# Patient Record
Sex: Male | Born: 1950
Health system: Southern US, Community
[De-identification: ages and names within clinical notes are randomized; demographics above are authoritative.]

## PROBLEM LIST (undated history)

## (undated) DIAGNOSIS — T7840XA Allergy, unspecified, initial encounter: Secondary | ICD-10-CM

## (undated) DIAGNOSIS — M1990 Unspecified osteoarthritis, unspecified site: Secondary | ICD-10-CM

## (undated) DIAGNOSIS — I10 Essential (primary) hypertension: Secondary | ICD-10-CM

## (undated) DIAGNOSIS — C719 Malignant neoplasm of brain, unspecified: Secondary | ICD-10-CM

## (undated) DIAGNOSIS — H269 Unspecified cataract: Secondary | ICD-10-CM

## (undated) DIAGNOSIS — K219 Gastro-esophageal reflux disease without esophagitis: Secondary | ICD-10-CM

## (undated) HISTORY — DX: Allergy, unspecified, initial encounter: T78.40XA

## (undated) HISTORY — DX: Unspecified cataract: H26.9

## (undated) HISTORY — DX: Unspecified osteoarthritis, unspecified site: M19.90

---

## 1990-06-11 HISTORY — PX: KNEE SURGERY: SHX244

## 1995-06-12 HISTORY — PX: CARPAL TUNNEL RELEASE: SHX101

## 2015-08-08 DIAGNOSIS — J3089 Other allergic rhinitis: Secondary | ICD-10-CM | POA: Diagnosis not present

## 2015-08-08 DIAGNOSIS — J301 Allergic rhinitis due to pollen: Secondary | ICD-10-CM | POA: Diagnosis not present

## 2015-08-24 DIAGNOSIS — J301 Allergic rhinitis due to pollen: Secondary | ICD-10-CM | POA: Diagnosis not present

## 2015-08-24 DIAGNOSIS — J3089 Other allergic rhinitis: Secondary | ICD-10-CM | POA: Diagnosis not present

## 2015-08-24 DIAGNOSIS — R69 Illness, unspecified: Secondary | ICD-10-CM | POA: Diagnosis not present

## 2015-09-07 DIAGNOSIS — J301 Allergic rhinitis due to pollen: Secondary | ICD-10-CM | POA: Diagnosis not present

## 2015-09-07 DIAGNOSIS — J3089 Other allergic rhinitis: Secondary | ICD-10-CM | POA: Diagnosis not present

## 2015-09-08 DIAGNOSIS — T50905A Adverse effect of unspecified drugs, medicaments and biological substances, initial encounter: Secondary | ICD-10-CM | POA: Diagnosis not present

## 2015-09-08 DIAGNOSIS — Z6829 Body mass index (BMI) 29.0-29.9, adult: Secondary | ICD-10-CM | POA: Diagnosis not present

## 2015-09-08 DIAGNOSIS — J309 Allergic rhinitis, unspecified: Secondary | ICD-10-CM | POA: Diagnosis not present

## 2015-10-05 DIAGNOSIS — J301 Allergic rhinitis due to pollen: Secondary | ICD-10-CM | POA: Diagnosis not present

## 2015-10-05 DIAGNOSIS — J3089 Other allergic rhinitis: Secondary | ICD-10-CM | POA: Diagnosis not present

## 2015-11-02 DIAGNOSIS — J3089 Other allergic rhinitis: Secondary | ICD-10-CM | POA: Diagnosis not present

## 2015-11-02 DIAGNOSIS — J301 Allergic rhinitis due to pollen: Secondary | ICD-10-CM | POA: Diagnosis not present

## 2015-11-30 DIAGNOSIS — J301 Allergic rhinitis due to pollen: Secondary | ICD-10-CM | POA: Diagnosis not present

## 2015-11-30 DIAGNOSIS — J3089 Other allergic rhinitis: Secondary | ICD-10-CM | POA: Diagnosis not present

## 2016-01-04 DIAGNOSIS — J301 Allergic rhinitis due to pollen: Secondary | ICD-10-CM | POA: Diagnosis not present

## 2016-01-04 DIAGNOSIS — J3089 Other allergic rhinitis: Secondary | ICD-10-CM | POA: Diagnosis not present

## 2016-02-15 DIAGNOSIS — J301 Allergic rhinitis due to pollen: Secondary | ICD-10-CM | POA: Diagnosis not present

## 2016-02-15 DIAGNOSIS — J3089 Other allergic rhinitis: Secondary | ICD-10-CM | POA: Diagnosis not present

## 2016-03-14 DIAGNOSIS — Z6829 Body mass index (BMI) 29.0-29.9, adult: Secondary | ICD-10-CM | POA: Diagnosis not present

## 2016-03-14 DIAGNOSIS — Z0000 Encounter for general adult medical examination without abnormal findings: Secondary | ICD-10-CM | POA: Diagnosis not present

## 2016-03-14 DIAGNOSIS — Z23 Encounter for immunization: Secondary | ICD-10-CM | POA: Diagnosis not present

## 2016-03-14 DIAGNOSIS — M542 Cervicalgia: Secondary | ICD-10-CM | POA: Diagnosis not present

## 2016-03-14 DIAGNOSIS — E569 Vitamin deficiency, unspecified: Secondary | ICD-10-CM | POA: Diagnosis not present

## 2016-03-14 DIAGNOSIS — I10 Essential (primary) hypertension: Secondary | ICD-10-CM | POA: Diagnosis not present

## 2016-03-14 DIAGNOSIS — H9202 Otalgia, left ear: Secondary | ICD-10-CM | POA: Diagnosis not present

## 2016-03-14 DIAGNOSIS — K219 Gastro-esophageal reflux disease without esophagitis: Secondary | ICD-10-CM | POA: Diagnosis not present

## 2016-03-14 DIAGNOSIS — E782 Mixed hyperlipidemia: Secondary | ICD-10-CM | POA: Diagnosis not present

## 2016-03-14 DIAGNOSIS — Z125 Encounter for screening for malignant neoplasm of prostate: Secondary | ICD-10-CM | POA: Diagnosis not present

## 2016-03-14 DIAGNOSIS — E559 Vitamin D deficiency, unspecified: Secondary | ICD-10-CM | POA: Diagnosis not present

## 2016-03-15 DIAGNOSIS — J301 Allergic rhinitis due to pollen: Secondary | ICD-10-CM | POA: Diagnosis not present

## 2016-03-15 DIAGNOSIS — J3089 Other allergic rhinitis: Secondary | ICD-10-CM | POA: Diagnosis not present

## 2016-04-25 DIAGNOSIS — R69 Illness, unspecified: Secondary | ICD-10-CM | POA: Diagnosis not present

## 2016-05-09 DIAGNOSIS — J301 Allergic rhinitis due to pollen: Secondary | ICD-10-CM | POA: Diagnosis not present

## 2016-05-09 DIAGNOSIS — J3089 Other allergic rhinitis: Secondary | ICD-10-CM | POA: Diagnosis not present

## 2016-06-06 DIAGNOSIS — J3089 Other allergic rhinitis: Secondary | ICD-10-CM | POA: Diagnosis not present

## 2016-06-06 DIAGNOSIS — H25093 Other age-related incipient cataract, bilateral: Secondary | ICD-10-CM | POA: Diagnosis not present

## 2016-06-06 DIAGNOSIS — J301 Allergic rhinitis due to pollen: Secondary | ICD-10-CM | POA: Diagnosis not present

## 2016-07-04 DIAGNOSIS — J3089 Other allergic rhinitis: Secondary | ICD-10-CM | POA: Diagnosis not present

## 2016-07-04 DIAGNOSIS — J301 Allergic rhinitis due to pollen: Secondary | ICD-10-CM | POA: Diagnosis not present

## 2016-07-31 DIAGNOSIS — R69 Illness, unspecified: Secondary | ICD-10-CM | POA: Diagnosis not present

## 2016-08-15 DIAGNOSIS — J3089 Other allergic rhinitis: Secondary | ICD-10-CM | POA: Diagnosis not present

## 2016-08-15 DIAGNOSIS — J301 Allergic rhinitis due to pollen: Secondary | ICD-10-CM | POA: Diagnosis not present

## 2016-09-03 DIAGNOSIS — J301 Allergic rhinitis due to pollen: Secondary | ICD-10-CM | POA: Diagnosis not present

## 2016-09-03 DIAGNOSIS — J3089 Other allergic rhinitis: Secondary | ICD-10-CM | POA: Diagnosis not present

## 2016-09-26 DIAGNOSIS — J3089 Other allergic rhinitis: Secondary | ICD-10-CM | POA: Diagnosis not present

## 2016-09-26 DIAGNOSIS — J3081 Allergic rhinitis due to animal (cat) (dog) hair and dander: Secondary | ICD-10-CM | POA: Diagnosis not present

## 2016-09-26 DIAGNOSIS — J301 Allergic rhinitis due to pollen: Secondary | ICD-10-CM | POA: Diagnosis not present

## 2016-10-12 DIAGNOSIS — J3081 Allergic rhinitis due to animal (cat) (dog) hair and dander: Secondary | ICD-10-CM | POA: Diagnosis not present

## 2016-10-12 DIAGNOSIS — J3089 Other allergic rhinitis: Secondary | ICD-10-CM | POA: Diagnosis not present

## 2016-10-12 DIAGNOSIS — J301 Allergic rhinitis due to pollen: Secondary | ICD-10-CM | POA: Diagnosis not present

## 2016-10-23 DIAGNOSIS — R69 Illness, unspecified: Secondary | ICD-10-CM | POA: Diagnosis not present

## 2016-10-24 DIAGNOSIS — J301 Allergic rhinitis due to pollen: Secondary | ICD-10-CM | POA: Diagnosis not present

## 2016-10-24 DIAGNOSIS — J3081 Allergic rhinitis due to animal (cat) (dog) hair and dander: Secondary | ICD-10-CM | POA: Diagnosis not present

## 2016-10-24 DIAGNOSIS — J3089 Other allergic rhinitis: Secondary | ICD-10-CM | POA: Diagnosis not present

## 2016-10-31 DIAGNOSIS — J301 Allergic rhinitis due to pollen: Secondary | ICD-10-CM | POA: Diagnosis not present

## 2016-10-31 DIAGNOSIS — J3089 Other allergic rhinitis: Secondary | ICD-10-CM | POA: Diagnosis not present

## 2016-10-31 DIAGNOSIS — J3081 Allergic rhinitis due to animal (cat) (dog) hair and dander: Secondary | ICD-10-CM | POA: Diagnosis not present

## 2016-11-07 DIAGNOSIS — J3089 Other allergic rhinitis: Secondary | ICD-10-CM | POA: Diagnosis not present

## 2016-11-07 DIAGNOSIS — J3081 Allergic rhinitis due to animal (cat) (dog) hair and dander: Secondary | ICD-10-CM | POA: Diagnosis not present

## 2016-11-07 DIAGNOSIS — J301 Allergic rhinitis due to pollen: Secondary | ICD-10-CM | POA: Diagnosis not present

## 2016-11-14 DIAGNOSIS — J301 Allergic rhinitis due to pollen: Secondary | ICD-10-CM | POA: Diagnosis not present

## 2016-11-14 DIAGNOSIS — J3081 Allergic rhinitis due to animal (cat) (dog) hair and dander: Secondary | ICD-10-CM | POA: Diagnosis not present

## 2016-11-14 DIAGNOSIS — J3089 Other allergic rhinitis: Secondary | ICD-10-CM | POA: Diagnosis not present

## 2016-11-21 DIAGNOSIS — J3081 Allergic rhinitis due to animal (cat) (dog) hair and dander: Secondary | ICD-10-CM | POA: Diagnosis not present

## 2016-11-21 DIAGNOSIS — J301 Allergic rhinitis due to pollen: Secondary | ICD-10-CM | POA: Diagnosis not present

## 2016-11-21 DIAGNOSIS — J3089 Other allergic rhinitis: Secondary | ICD-10-CM | POA: Diagnosis not present

## 2016-11-27 DIAGNOSIS — J3081 Allergic rhinitis due to animal (cat) (dog) hair and dander: Secondary | ICD-10-CM | POA: Diagnosis not present

## 2016-11-27 DIAGNOSIS — J3089 Other allergic rhinitis: Secondary | ICD-10-CM | POA: Diagnosis not present

## 2016-11-27 DIAGNOSIS — J301 Allergic rhinitis due to pollen: Secondary | ICD-10-CM | POA: Diagnosis not present

## 2016-11-28 DIAGNOSIS — R69 Illness, unspecified: Secondary | ICD-10-CM | POA: Diagnosis not present

## 2016-12-04 DIAGNOSIS — J3089 Other allergic rhinitis: Secondary | ICD-10-CM | POA: Diagnosis not present

## 2016-12-04 DIAGNOSIS — J301 Allergic rhinitis due to pollen: Secondary | ICD-10-CM | POA: Diagnosis not present

## 2016-12-04 DIAGNOSIS — J3081 Allergic rhinitis due to animal (cat) (dog) hair and dander: Secondary | ICD-10-CM | POA: Diagnosis not present

## 2016-12-11 DIAGNOSIS — J301 Allergic rhinitis due to pollen: Secondary | ICD-10-CM | POA: Diagnosis not present

## 2016-12-11 DIAGNOSIS — J3089 Other allergic rhinitis: Secondary | ICD-10-CM | POA: Diagnosis not present

## 2016-12-11 DIAGNOSIS — J3081 Allergic rhinitis due to animal (cat) (dog) hair and dander: Secondary | ICD-10-CM | POA: Diagnosis not present

## 2016-12-19 DIAGNOSIS — J301 Allergic rhinitis due to pollen: Secondary | ICD-10-CM | POA: Diagnosis not present

## 2016-12-19 DIAGNOSIS — J3089 Other allergic rhinitis: Secondary | ICD-10-CM | POA: Diagnosis not present

## 2016-12-19 DIAGNOSIS — J3081 Allergic rhinitis due to animal (cat) (dog) hair and dander: Secondary | ICD-10-CM | POA: Diagnosis not present

## 2016-12-26 DIAGNOSIS — J3089 Other allergic rhinitis: Secondary | ICD-10-CM | POA: Diagnosis not present

## 2016-12-26 DIAGNOSIS — J301 Allergic rhinitis due to pollen: Secondary | ICD-10-CM | POA: Diagnosis not present

## 2016-12-26 DIAGNOSIS — J3081 Allergic rhinitis due to animal (cat) (dog) hair and dander: Secondary | ICD-10-CM | POA: Diagnosis not present

## 2017-01-09 DIAGNOSIS — J3089 Other allergic rhinitis: Secondary | ICD-10-CM | POA: Diagnosis not present

## 2017-01-09 DIAGNOSIS — J301 Allergic rhinitis due to pollen: Secondary | ICD-10-CM | POA: Diagnosis not present

## 2017-01-09 DIAGNOSIS — J3081 Allergic rhinitis due to animal (cat) (dog) hair and dander: Secondary | ICD-10-CM | POA: Diagnosis not present

## 2017-01-16 DIAGNOSIS — J3081 Allergic rhinitis due to animal (cat) (dog) hair and dander: Secondary | ICD-10-CM | POA: Diagnosis not present

## 2017-01-16 DIAGNOSIS — J3089 Other allergic rhinitis: Secondary | ICD-10-CM | POA: Diagnosis not present

## 2017-01-16 DIAGNOSIS — J301 Allergic rhinitis due to pollen: Secondary | ICD-10-CM | POA: Diagnosis not present

## 2017-01-23 DIAGNOSIS — J301 Allergic rhinitis due to pollen: Secondary | ICD-10-CM | POA: Diagnosis not present

## 2017-01-23 DIAGNOSIS — J3081 Allergic rhinitis due to animal (cat) (dog) hair and dander: Secondary | ICD-10-CM | POA: Diagnosis not present

## 2017-01-23 DIAGNOSIS — J3089 Other allergic rhinitis: Secondary | ICD-10-CM | POA: Diagnosis not present

## 2017-01-30 DIAGNOSIS — J3081 Allergic rhinitis due to animal (cat) (dog) hair and dander: Secondary | ICD-10-CM | POA: Diagnosis not present

## 2017-01-30 DIAGNOSIS — J301 Allergic rhinitis due to pollen: Secondary | ICD-10-CM | POA: Diagnosis not present

## 2017-01-30 DIAGNOSIS — J3089 Other allergic rhinitis: Secondary | ICD-10-CM | POA: Diagnosis not present

## 2017-02-04 DIAGNOSIS — J3081 Allergic rhinitis due to animal (cat) (dog) hair and dander: Secondary | ICD-10-CM | POA: Diagnosis not present

## 2017-02-04 DIAGNOSIS — J3089 Other allergic rhinitis: Secondary | ICD-10-CM | POA: Diagnosis not present

## 2017-02-04 DIAGNOSIS — J301 Allergic rhinitis due to pollen: Secondary | ICD-10-CM | POA: Diagnosis not present

## 2017-02-12 DIAGNOSIS — Z1211 Encounter for screening for malignant neoplasm of colon: Secondary | ICD-10-CM | POA: Diagnosis not present

## 2017-02-13 DIAGNOSIS — J3089 Other allergic rhinitis: Secondary | ICD-10-CM | POA: Diagnosis not present

## 2017-02-13 DIAGNOSIS — J301 Allergic rhinitis due to pollen: Secondary | ICD-10-CM | POA: Diagnosis not present

## 2017-02-13 DIAGNOSIS — J3081 Allergic rhinitis due to animal (cat) (dog) hair and dander: Secondary | ICD-10-CM | POA: Diagnosis not present

## 2017-02-20 DIAGNOSIS — J3089 Other allergic rhinitis: Secondary | ICD-10-CM | POA: Diagnosis not present

## 2017-02-20 DIAGNOSIS — J3081 Allergic rhinitis due to animal (cat) (dog) hair and dander: Secondary | ICD-10-CM | POA: Diagnosis not present

## 2017-02-20 DIAGNOSIS — J301 Allergic rhinitis due to pollen: Secondary | ICD-10-CM | POA: Diagnosis not present

## 2017-02-26 DIAGNOSIS — J301 Allergic rhinitis due to pollen: Secondary | ICD-10-CM | POA: Diagnosis not present

## 2017-02-26 DIAGNOSIS — J3081 Allergic rhinitis due to animal (cat) (dog) hair and dander: Secondary | ICD-10-CM | POA: Diagnosis not present

## 2017-02-26 DIAGNOSIS — J3089 Other allergic rhinitis: Secondary | ICD-10-CM | POA: Diagnosis not present

## 2017-02-26 DIAGNOSIS — R69 Illness, unspecified: Secondary | ICD-10-CM | POA: Diagnosis not present

## 2017-03-02 DIAGNOSIS — R69 Illness, unspecified: Secondary | ICD-10-CM | POA: Diagnosis not present

## 2017-03-06 DIAGNOSIS — J301 Allergic rhinitis due to pollen: Secondary | ICD-10-CM | POA: Diagnosis not present

## 2017-03-06 DIAGNOSIS — J3089 Other allergic rhinitis: Secondary | ICD-10-CM | POA: Diagnosis not present

## 2017-03-06 DIAGNOSIS — J3081 Allergic rhinitis due to animal (cat) (dog) hair and dander: Secondary | ICD-10-CM | POA: Diagnosis not present

## 2017-03-12 DIAGNOSIS — M79672 Pain in left foot: Secondary | ICD-10-CM | POA: Diagnosis not present

## 2017-03-12 DIAGNOSIS — Z6825 Body mass index (BMI) 25.0-25.9, adult: Secondary | ICD-10-CM | POA: Diagnosis not present

## 2017-03-12 DIAGNOSIS — R52 Pain, unspecified: Secondary | ICD-10-CM | POA: Diagnosis not present

## 2017-03-12 DIAGNOSIS — M9971 Connective tissue and disc stenosis of intervertebral foramina of cervical region: Secondary | ICD-10-CM | POA: Diagnosis not present

## 2017-03-12 DIAGNOSIS — M5030 Other cervical disc degeneration, unspecified cervical region: Secondary | ICD-10-CM | POA: Diagnosis not present

## 2017-03-12 DIAGNOSIS — J301 Allergic rhinitis due to pollen: Secondary | ICD-10-CM | POA: Diagnosis not present

## 2017-03-12 DIAGNOSIS — I10 Essential (primary) hypertension: Secondary | ICD-10-CM | POA: Diagnosis not present

## 2017-03-12 DIAGNOSIS — J3089 Other allergic rhinitis: Secondary | ICD-10-CM | POA: Diagnosis not present

## 2017-03-12 DIAGNOSIS — J3081 Allergic rhinitis due to animal (cat) (dog) hair and dander: Secondary | ICD-10-CM | POA: Diagnosis not present

## 2017-03-12 DIAGNOSIS — M542 Cervicalgia: Secondary | ICD-10-CM | POA: Diagnosis not present

## 2017-03-12 DIAGNOSIS — Z1389 Encounter for screening for other disorder: Secondary | ICD-10-CM | POA: Diagnosis not present

## 2017-03-12 DIAGNOSIS — Z720 Tobacco use: Secondary | ICD-10-CM | POA: Diagnosis not present

## 2017-03-12 DIAGNOSIS — M4312 Spondylolisthesis, cervical region: Secondary | ICD-10-CM | POA: Diagnosis not present

## 2017-03-12 DIAGNOSIS — M19072 Primary osteoarthritis, left ankle and foot: Secondary | ICD-10-CM | POA: Diagnosis not present

## 2017-03-12 DIAGNOSIS — M47892 Other spondylosis, cervical region: Secondary | ICD-10-CM | POA: Diagnosis not present

## 2017-03-19 DIAGNOSIS — J301 Allergic rhinitis due to pollen: Secondary | ICD-10-CM | POA: Diagnosis not present

## 2017-03-19 DIAGNOSIS — J3081 Allergic rhinitis due to animal (cat) (dog) hair and dander: Secondary | ICD-10-CM | POA: Diagnosis not present

## 2017-03-19 DIAGNOSIS — J3089 Other allergic rhinitis: Secondary | ICD-10-CM | POA: Diagnosis not present

## 2017-03-26 DIAGNOSIS — J301 Allergic rhinitis due to pollen: Secondary | ICD-10-CM | POA: Diagnosis not present

## 2017-03-26 DIAGNOSIS — J3081 Allergic rhinitis due to animal (cat) (dog) hair and dander: Secondary | ICD-10-CM | POA: Diagnosis not present

## 2017-03-26 DIAGNOSIS — J3089 Other allergic rhinitis: Secondary | ICD-10-CM | POA: Diagnosis not present

## 2017-04-02 DIAGNOSIS — J301 Allergic rhinitis due to pollen: Secondary | ICD-10-CM | POA: Diagnosis not present

## 2017-04-02 DIAGNOSIS — J3081 Allergic rhinitis due to animal (cat) (dog) hair and dander: Secondary | ICD-10-CM | POA: Diagnosis not present

## 2017-04-02 DIAGNOSIS — J3089 Other allergic rhinitis: Secondary | ICD-10-CM | POA: Diagnosis not present

## 2017-04-10 DIAGNOSIS — J3081 Allergic rhinitis due to animal (cat) (dog) hair and dander: Secondary | ICD-10-CM | POA: Diagnosis not present

## 2017-04-10 DIAGNOSIS — J301 Allergic rhinitis due to pollen: Secondary | ICD-10-CM | POA: Diagnosis not present

## 2017-04-10 DIAGNOSIS — J3089 Other allergic rhinitis: Secondary | ICD-10-CM | POA: Diagnosis not present

## 2017-04-16 DIAGNOSIS — J3089 Other allergic rhinitis: Secondary | ICD-10-CM | POA: Diagnosis not present

## 2017-04-16 DIAGNOSIS — J3081 Allergic rhinitis due to animal (cat) (dog) hair and dander: Secondary | ICD-10-CM | POA: Diagnosis not present

## 2017-04-16 DIAGNOSIS — J301 Allergic rhinitis due to pollen: Secondary | ICD-10-CM | POA: Diagnosis not present

## 2017-04-23 DIAGNOSIS — J3081 Allergic rhinitis due to animal (cat) (dog) hair and dander: Secondary | ICD-10-CM | POA: Diagnosis not present

## 2017-04-23 DIAGNOSIS — J3089 Other allergic rhinitis: Secondary | ICD-10-CM | POA: Diagnosis not present

## 2017-04-23 DIAGNOSIS — J301 Allergic rhinitis due to pollen: Secondary | ICD-10-CM | POA: Diagnosis not present

## 2017-04-29 DIAGNOSIS — J301 Allergic rhinitis due to pollen: Secondary | ICD-10-CM | POA: Diagnosis not present

## 2017-04-29 DIAGNOSIS — J3081 Allergic rhinitis due to animal (cat) (dog) hair and dander: Secondary | ICD-10-CM | POA: Diagnosis not present

## 2017-04-29 DIAGNOSIS — J3089 Other allergic rhinitis: Secondary | ICD-10-CM | POA: Diagnosis not present

## 2017-05-08 DIAGNOSIS — J3089 Other allergic rhinitis: Secondary | ICD-10-CM | POA: Diagnosis not present

## 2017-05-08 DIAGNOSIS — J3081 Allergic rhinitis due to animal (cat) (dog) hair and dander: Secondary | ICD-10-CM | POA: Diagnosis not present

## 2017-05-08 DIAGNOSIS — J301 Allergic rhinitis due to pollen: Secondary | ICD-10-CM | POA: Diagnosis not present

## 2017-05-17 DIAGNOSIS — J3089 Other allergic rhinitis: Secondary | ICD-10-CM | POA: Diagnosis not present

## 2017-05-17 DIAGNOSIS — J301 Allergic rhinitis due to pollen: Secondary | ICD-10-CM | POA: Diagnosis not present

## 2017-05-17 DIAGNOSIS — J3081 Allergic rhinitis due to animal (cat) (dog) hair and dander: Secondary | ICD-10-CM | POA: Diagnosis not present

## 2017-05-27 DIAGNOSIS — J3081 Allergic rhinitis due to animal (cat) (dog) hair and dander: Secondary | ICD-10-CM | POA: Diagnosis not present

## 2017-05-27 DIAGNOSIS — J3089 Other allergic rhinitis: Secondary | ICD-10-CM | POA: Diagnosis not present

## 2017-05-27 DIAGNOSIS — J301 Allergic rhinitis due to pollen: Secondary | ICD-10-CM | POA: Diagnosis not present

## 2017-06-17 DIAGNOSIS — H5203 Hypermetropia, bilateral: Secondary | ICD-10-CM | POA: Diagnosis not present

## 2017-06-24 DIAGNOSIS — J3081 Allergic rhinitis due to animal (cat) (dog) hair and dander: Secondary | ICD-10-CM | POA: Diagnosis not present

## 2017-06-24 DIAGNOSIS — J3089 Other allergic rhinitis: Secondary | ICD-10-CM | POA: Diagnosis not present

## 2017-06-24 DIAGNOSIS — J301 Allergic rhinitis due to pollen: Secondary | ICD-10-CM | POA: Diagnosis not present

## 2017-07-23 DIAGNOSIS — J3081 Allergic rhinitis due to animal (cat) (dog) hair and dander: Secondary | ICD-10-CM | POA: Diagnosis not present

## 2017-07-23 DIAGNOSIS — J301 Allergic rhinitis due to pollen: Secondary | ICD-10-CM | POA: Diagnosis not present

## 2017-07-23 DIAGNOSIS — J3089 Other allergic rhinitis: Secondary | ICD-10-CM | POA: Diagnosis not present

## 2017-08-21 DIAGNOSIS — J3081 Allergic rhinitis due to animal (cat) (dog) hair and dander: Secondary | ICD-10-CM | POA: Diagnosis not present

## 2017-08-21 DIAGNOSIS — J3089 Other allergic rhinitis: Secondary | ICD-10-CM | POA: Diagnosis not present

## 2017-08-21 DIAGNOSIS — J301 Allergic rhinitis due to pollen: Secondary | ICD-10-CM | POA: Diagnosis not present

## 2017-09-18 DIAGNOSIS — J3081 Allergic rhinitis due to animal (cat) (dog) hair and dander: Secondary | ICD-10-CM | POA: Diagnosis not present

## 2017-09-18 DIAGNOSIS — J301 Allergic rhinitis due to pollen: Secondary | ICD-10-CM | POA: Diagnosis not present

## 2017-09-18 DIAGNOSIS — J3089 Other allergic rhinitis: Secondary | ICD-10-CM | POA: Diagnosis not present

## 2017-10-16 DIAGNOSIS — J3089 Other allergic rhinitis: Secondary | ICD-10-CM | POA: Diagnosis not present

## 2017-10-16 DIAGNOSIS — J3081 Allergic rhinitis due to animal (cat) (dog) hair and dander: Secondary | ICD-10-CM | POA: Diagnosis not present

## 2017-10-16 DIAGNOSIS — J301 Allergic rhinitis due to pollen: Secondary | ICD-10-CM | POA: Diagnosis not present

## 2017-11-12 DIAGNOSIS — J301 Allergic rhinitis due to pollen: Secondary | ICD-10-CM | POA: Diagnosis not present

## 2017-11-12 DIAGNOSIS — J3089 Other allergic rhinitis: Secondary | ICD-10-CM | POA: Diagnosis not present

## 2017-11-12 DIAGNOSIS — J3081 Allergic rhinitis due to animal (cat) (dog) hair and dander: Secondary | ICD-10-CM | POA: Diagnosis not present

## 2017-12-10 DIAGNOSIS — J3081 Allergic rhinitis due to animal (cat) (dog) hair and dander: Secondary | ICD-10-CM | POA: Diagnosis not present

## 2017-12-10 DIAGNOSIS — J3089 Other allergic rhinitis: Secondary | ICD-10-CM | POA: Diagnosis not present

## 2017-12-10 DIAGNOSIS — J301 Allergic rhinitis due to pollen: Secondary | ICD-10-CM | POA: Diagnosis not present

## 2017-12-21 DIAGNOSIS — R030 Elevated blood-pressure reading, without diagnosis of hypertension: Secondary | ICD-10-CM | POA: Diagnosis not present

## 2017-12-21 DIAGNOSIS — E559 Vitamin D deficiency, unspecified: Secondary | ICD-10-CM | POA: Diagnosis not present

## 2017-12-21 DIAGNOSIS — Z825 Family history of asthma and other chronic lower respiratory diseases: Secondary | ICD-10-CM | POA: Diagnosis not present

## 2017-12-21 DIAGNOSIS — M1990 Unspecified osteoarthritis, unspecified site: Secondary | ICD-10-CM | POA: Diagnosis not present

## 2017-12-21 DIAGNOSIS — Z791 Long term (current) use of non-steroidal anti-inflammatories (NSAID): Secondary | ICD-10-CM | POA: Diagnosis not present

## 2017-12-21 DIAGNOSIS — Z8249 Family history of ischemic heart disease and other diseases of the circulatory system: Secondary | ICD-10-CM | POA: Diagnosis not present

## 2017-12-21 DIAGNOSIS — G8929 Other chronic pain: Secondary | ICD-10-CM | POA: Diagnosis not present

## 2017-12-21 DIAGNOSIS — K219 Gastro-esophageal reflux disease without esophagitis: Secondary | ICD-10-CM | POA: Diagnosis not present

## 2017-12-21 DIAGNOSIS — J309 Allergic rhinitis, unspecified: Secondary | ICD-10-CM | POA: Diagnosis not present

## 2017-12-21 DIAGNOSIS — Z833 Family history of diabetes mellitus: Secondary | ICD-10-CM | POA: Diagnosis not present

## 2018-01-08 DIAGNOSIS — J3081 Allergic rhinitis due to animal (cat) (dog) hair and dander: Secondary | ICD-10-CM | POA: Diagnosis not present

## 2018-01-08 DIAGNOSIS — J3089 Other allergic rhinitis: Secondary | ICD-10-CM | POA: Diagnosis not present

## 2018-01-08 DIAGNOSIS — J301 Allergic rhinitis due to pollen: Secondary | ICD-10-CM | POA: Diagnosis not present

## 2018-02-05 DIAGNOSIS — J3081 Allergic rhinitis due to animal (cat) (dog) hair and dander: Secondary | ICD-10-CM | POA: Diagnosis not present

## 2018-02-05 DIAGNOSIS — J3089 Other allergic rhinitis: Secondary | ICD-10-CM | POA: Diagnosis not present

## 2018-02-05 DIAGNOSIS — J301 Allergic rhinitis due to pollen: Secondary | ICD-10-CM | POA: Diagnosis not present

## 2018-02-06 DIAGNOSIS — E785 Hyperlipidemia, unspecified: Secondary | ICD-10-CM | POA: Diagnosis not present

## 2018-02-06 DIAGNOSIS — E559 Vitamin D deficiency, unspecified: Secondary | ICD-10-CM | POA: Diagnosis not present

## 2018-02-06 DIAGNOSIS — Z1389 Encounter for screening for other disorder: Secondary | ICD-10-CM | POA: Diagnosis not present

## 2018-02-06 DIAGNOSIS — I10 Essential (primary) hypertension: Secondary | ICD-10-CM | POA: Diagnosis not present

## 2018-02-06 DIAGNOSIS — K219 Gastro-esophageal reflux disease without esophagitis: Secondary | ICD-10-CM | POA: Diagnosis not present

## 2018-02-28 DIAGNOSIS — Z23 Encounter for immunization: Secondary | ICD-10-CM | POA: Diagnosis not present

## 2018-03-05 DIAGNOSIS — J3081 Allergic rhinitis due to animal (cat) (dog) hair and dander: Secondary | ICD-10-CM | POA: Diagnosis not present

## 2018-03-05 DIAGNOSIS — J301 Allergic rhinitis due to pollen: Secondary | ICD-10-CM | POA: Diagnosis not present

## 2018-03-05 DIAGNOSIS — J3089 Other allergic rhinitis: Secondary | ICD-10-CM | POA: Diagnosis not present

## 2018-03-11 DIAGNOSIS — L570 Actinic keratosis: Secondary | ICD-10-CM | POA: Diagnosis not present

## 2018-03-11 DIAGNOSIS — D225 Melanocytic nevi of trunk: Secondary | ICD-10-CM | POA: Diagnosis not present

## 2018-03-11 DIAGNOSIS — L821 Other seborrheic keratosis: Secondary | ICD-10-CM | POA: Diagnosis not present

## 2018-03-11 DIAGNOSIS — D1801 Hemangioma of skin and subcutaneous tissue: Secondary | ICD-10-CM | POA: Diagnosis not present

## 2018-03-11 DIAGNOSIS — L814 Other melanin hyperpigmentation: Secondary | ICD-10-CM | POA: Diagnosis not present

## 2018-03-18 DIAGNOSIS — E785 Hyperlipidemia, unspecified: Secondary | ICD-10-CM | POA: Diagnosis not present

## 2018-03-18 DIAGNOSIS — Z0000 Encounter for general adult medical examination without abnormal findings: Secondary | ICD-10-CM | POA: Diagnosis not present

## 2018-03-18 DIAGNOSIS — K219 Gastro-esophageal reflux disease without esophagitis: Secondary | ICD-10-CM | POA: Diagnosis not present

## 2018-03-18 DIAGNOSIS — Z125 Encounter for screening for malignant neoplasm of prostate: Secondary | ICD-10-CM | POA: Diagnosis not present

## 2018-03-18 DIAGNOSIS — Z23 Encounter for immunization: Secondary | ICD-10-CM | POA: Diagnosis not present

## 2018-03-18 DIAGNOSIS — E559 Vitamin D deficiency, unspecified: Secondary | ICD-10-CM | POA: Diagnosis not present

## 2018-03-18 DIAGNOSIS — Z1389 Encounter for screening for other disorder: Secondary | ICD-10-CM | POA: Diagnosis not present

## 2018-03-18 DIAGNOSIS — J309 Allergic rhinitis, unspecified: Secondary | ICD-10-CM | POA: Diagnosis not present

## 2018-03-18 DIAGNOSIS — I10 Essential (primary) hypertension: Secondary | ICD-10-CM | POA: Diagnosis not present

## 2018-03-18 DIAGNOSIS — Z1159 Encounter for screening for other viral diseases: Secondary | ICD-10-CM | POA: Diagnosis not present

## 2018-04-09 DIAGNOSIS — J3089 Other allergic rhinitis: Secondary | ICD-10-CM | POA: Diagnosis not present

## 2018-04-09 DIAGNOSIS — J301 Allergic rhinitis due to pollen: Secondary | ICD-10-CM | POA: Diagnosis not present

## 2018-04-09 DIAGNOSIS — J3081 Allergic rhinitis due to animal (cat) (dog) hair and dander: Secondary | ICD-10-CM | POA: Diagnosis not present

## 2018-04-23 DIAGNOSIS — R69 Illness, unspecified: Secondary | ICD-10-CM | POA: Diagnosis not present

## 2018-05-14 DIAGNOSIS — J3089 Other allergic rhinitis: Secondary | ICD-10-CM | POA: Diagnosis not present

## 2018-05-14 DIAGNOSIS — J301 Allergic rhinitis due to pollen: Secondary | ICD-10-CM | POA: Diagnosis not present

## 2018-05-14 DIAGNOSIS — J3081 Allergic rhinitis due to animal (cat) (dog) hair and dander: Secondary | ICD-10-CM | POA: Diagnosis not present

## 2018-05-23 DIAGNOSIS — J301 Allergic rhinitis due to pollen: Secondary | ICD-10-CM | POA: Diagnosis not present

## 2018-05-23 DIAGNOSIS — J3089 Other allergic rhinitis: Secondary | ICD-10-CM | POA: Diagnosis not present

## 2018-05-23 DIAGNOSIS — J3081 Allergic rhinitis due to animal (cat) (dog) hair and dander: Secondary | ICD-10-CM | POA: Diagnosis not present

## 2018-06-10 DIAGNOSIS — J301 Allergic rhinitis due to pollen: Secondary | ICD-10-CM | POA: Diagnosis not present

## 2018-06-10 DIAGNOSIS — J3081 Allergic rhinitis due to animal (cat) (dog) hair and dander: Secondary | ICD-10-CM | POA: Diagnosis not present

## 2018-06-10 DIAGNOSIS — J3089 Other allergic rhinitis: Secondary | ICD-10-CM | POA: Diagnosis not present

## 2018-06-25 DIAGNOSIS — H5203 Hypermetropia, bilateral: Secondary | ICD-10-CM | POA: Diagnosis not present

## 2018-07-02 DIAGNOSIS — J3089 Other allergic rhinitis: Secondary | ICD-10-CM | POA: Diagnosis not present

## 2018-07-02 DIAGNOSIS — J301 Allergic rhinitis due to pollen: Secondary | ICD-10-CM | POA: Diagnosis not present

## 2018-07-02 DIAGNOSIS — J3081 Allergic rhinitis due to animal (cat) (dog) hair and dander: Secondary | ICD-10-CM | POA: Diagnosis not present

## 2018-07-21 DIAGNOSIS — Z0100 Encounter for examination of eyes and vision without abnormal findings: Secondary | ICD-10-CM | POA: Diagnosis not present

## 2018-07-21 DIAGNOSIS — M6281 Muscle weakness (generalized): Secondary | ICD-10-CM | POA: Diagnosis not present

## 2018-07-21 DIAGNOSIS — R269 Unspecified abnormalities of gait and mobility: Secondary | ICD-10-CM | POA: Diagnosis not present

## 2018-07-21 DIAGNOSIS — R69 Illness, unspecified: Secondary | ICD-10-CM | POA: Diagnosis not present

## 2018-08-06 DIAGNOSIS — R69 Illness, unspecified: Secondary | ICD-10-CM | POA: Diagnosis not present

## 2018-08-11 DIAGNOSIS — J3081 Allergic rhinitis due to animal (cat) (dog) hair and dander: Secondary | ICD-10-CM | POA: Diagnosis not present

## 2018-08-11 DIAGNOSIS — J3089 Other allergic rhinitis: Secondary | ICD-10-CM | POA: Diagnosis not present

## 2018-08-11 DIAGNOSIS — J301 Allergic rhinitis due to pollen: Secondary | ICD-10-CM | POA: Diagnosis not present

## 2018-09-10 DIAGNOSIS — J3081 Allergic rhinitis due to animal (cat) (dog) hair and dander: Secondary | ICD-10-CM | POA: Diagnosis not present

## 2018-09-10 DIAGNOSIS — J3089 Other allergic rhinitis: Secondary | ICD-10-CM | POA: Diagnosis not present

## 2018-09-10 DIAGNOSIS — J301 Allergic rhinitis due to pollen: Secondary | ICD-10-CM | POA: Diagnosis not present

## 2018-09-25 DIAGNOSIS — E785 Hyperlipidemia, unspecified: Secondary | ICD-10-CM | POA: Diagnosis not present

## 2018-09-25 DIAGNOSIS — K219 Gastro-esophageal reflux disease without esophagitis: Secondary | ICD-10-CM | POA: Diagnosis not present

## 2018-09-25 DIAGNOSIS — I10 Essential (primary) hypertension: Secondary | ICD-10-CM | POA: Diagnosis not present

## 2018-09-25 DIAGNOSIS — J309 Allergic rhinitis, unspecified: Secondary | ICD-10-CM | POA: Diagnosis not present

## 2018-10-15 DIAGNOSIS — J3081 Allergic rhinitis due to animal (cat) (dog) hair and dander: Secondary | ICD-10-CM | POA: Diagnosis not present

## 2018-10-15 DIAGNOSIS — J3089 Other allergic rhinitis: Secondary | ICD-10-CM | POA: Diagnosis not present

## 2018-10-15 DIAGNOSIS — J301 Allergic rhinitis due to pollen: Secondary | ICD-10-CM | POA: Diagnosis not present

## 2018-10-16 DIAGNOSIS — J3081 Allergic rhinitis due to animal (cat) (dog) hair and dander: Secondary | ICD-10-CM | POA: Diagnosis not present

## 2018-10-16 DIAGNOSIS — J301 Allergic rhinitis due to pollen: Secondary | ICD-10-CM | POA: Diagnosis not present

## 2018-10-16 DIAGNOSIS — J3089 Other allergic rhinitis: Secondary | ICD-10-CM | POA: Diagnosis not present

## 2018-11-25 DIAGNOSIS — J301 Allergic rhinitis due to pollen: Secondary | ICD-10-CM | POA: Diagnosis not present

## 2018-11-25 DIAGNOSIS — J3081 Allergic rhinitis due to animal (cat) (dog) hair and dander: Secondary | ICD-10-CM | POA: Diagnosis not present

## 2018-11-25 DIAGNOSIS — J3089 Other allergic rhinitis: Secondary | ICD-10-CM | POA: Diagnosis not present

## 2018-12-03 DIAGNOSIS — R69 Illness, unspecified: Secondary | ICD-10-CM | POA: Diagnosis not present

## 2018-12-10 DIAGNOSIS — J3089 Other allergic rhinitis: Secondary | ICD-10-CM | POA: Diagnosis not present

## 2018-12-10 DIAGNOSIS — J301 Allergic rhinitis due to pollen: Secondary | ICD-10-CM | POA: Diagnosis not present

## 2018-12-10 DIAGNOSIS — J3081 Allergic rhinitis due to animal (cat) (dog) hair and dander: Secondary | ICD-10-CM | POA: Diagnosis not present

## 2019-01-07 DIAGNOSIS — J3089 Other allergic rhinitis: Secondary | ICD-10-CM | POA: Diagnosis not present

## 2019-01-07 DIAGNOSIS — J301 Allergic rhinitis due to pollen: Secondary | ICD-10-CM | POA: Diagnosis not present

## 2019-01-07 DIAGNOSIS — J3081 Allergic rhinitis due to animal (cat) (dog) hair and dander: Secondary | ICD-10-CM | POA: Diagnosis not present

## 2019-01-21 DIAGNOSIS — J3089 Other allergic rhinitis: Secondary | ICD-10-CM | POA: Diagnosis not present

## 2019-01-21 DIAGNOSIS — J301 Allergic rhinitis due to pollen: Secondary | ICD-10-CM | POA: Diagnosis not present

## 2019-01-21 DIAGNOSIS — J3081 Allergic rhinitis due to animal (cat) (dog) hair and dander: Secondary | ICD-10-CM | POA: Diagnosis not present

## 2019-02-09 DIAGNOSIS — J3081 Allergic rhinitis due to animal (cat) (dog) hair and dander: Secondary | ICD-10-CM | POA: Diagnosis not present

## 2019-02-09 DIAGNOSIS — J3089 Other allergic rhinitis: Secondary | ICD-10-CM | POA: Diagnosis not present

## 2019-02-09 DIAGNOSIS — J301 Allergic rhinitis due to pollen: Secondary | ICD-10-CM | POA: Diagnosis not present

## 2019-02-18 DIAGNOSIS — J3089 Other allergic rhinitis: Secondary | ICD-10-CM | POA: Diagnosis not present

## 2019-02-18 DIAGNOSIS — J3081 Allergic rhinitis due to animal (cat) (dog) hair and dander: Secondary | ICD-10-CM | POA: Diagnosis not present

## 2019-02-18 DIAGNOSIS — J301 Allergic rhinitis due to pollen: Secondary | ICD-10-CM | POA: Diagnosis not present

## 2019-02-25 DIAGNOSIS — J3081 Allergic rhinitis due to animal (cat) (dog) hair and dander: Secondary | ICD-10-CM | POA: Diagnosis not present

## 2019-02-25 DIAGNOSIS — J3089 Other allergic rhinitis: Secondary | ICD-10-CM | POA: Diagnosis not present

## 2019-02-25 DIAGNOSIS — J301 Allergic rhinitis due to pollen: Secondary | ICD-10-CM | POA: Diagnosis not present

## 2019-03-03 DIAGNOSIS — J301 Allergic rhinitis due to pollen: Secondary | ICD-10-CM | POA: Diagnosis not present

## 2019-03-03 DIAGNOSIS — J3081 Allergic rhinitis due to animal (cat) (dog) hair and dander: Secondary | ICD-10-CM | POA: Diagnosis not present

## 2019-03-03 DIAGNOSIS — J3089 Other allergic rhinitis: Secondary | ICD-10-CM | POA: Diagnosis not present

## 2019-03-11 DIAGNOSIS — J301 Allergic rhinitis due to pollen: Secondary | ICD-10-CM | POA: Diagnosis not present

## 2019-03-11 DIAGNOSIS — J3089 Other allergic rhinitis: Secondary | ICD-10-CM | POA: Diagnosis not present

## 2019-03-11 DIAGNOSIS — J3081 Allergic rhinitis due to animal (cat) (dog) hair and dander: Secondary | ICD-10-CM | POA: Diagnosis not present

## 2019-03-12 DIAGNOSIS — D1801 Hemangioma of skin and subcutaneous tissue: Secondary | ICD-10-CM | POA: Diagnosis not present

## 2019-03-12 DIAGNOSIS — L821 Other seborrheic keratosis: Secondary | ICD-10-CM | POA: Diagnosis not present

## 2019-03-12 DIAGNOSIS — L853 Xerosis cutis: Secondary | ICD-10-CM | POA: Diagnosis not present

## 2019-03-12 DIAGNOSIS — L814 Other melanin hyperpigmentation: Secondary | ICD-10-CM | POA: Diagnosis not present

## 2019-03-12 DIAGNOSIS — D225 Melanocytic nevi of trunk: Secondary | ICD-10-CM | POA: Diagnosis not present

## 2019-03-12 DIAGNOSIS — L570 Actinic keratosis: Secondary | ICD-10-CM | POA: Diagnosis not present

## 2019-03-17 DIAGNOSIS — J301 Allergic rhinitis due to pollen: Secondary | ICD-10-CM | POA: Diagnosis not present

## 2019-03-17 DIAGNOSIS — J3089 Other allergic rhinitis: Secondary | ICD-10-CM | POA: Diagnosis not present

## 2019-03-17 DIAGNOSIS — J3081 Allergic rhinitis due to animal (cat) (dog) hair and dander: Secondary | ICD-10-CM | POA: Diagnosis not present

## 2019-03-18 DIAGNOSIS — Z23 Encounter for immunization: Secondary | ICD-10-CM | POA: Diagnosis not present

## 2019-03-25 DIAGNOSIS — J3089 Other allergic rhinitis: Secondary | ICD-10-CM | POA: Diagnosis not present

## 2019-03-25 DIAGNOSIS — R69 Illness, unspecified: Secondary | ICD-10-CM | POA: Diagnosis not present

## 2019-03-25 DIAGNOSIS — J3081 Allergic rhinitis due to animal (cat) (dog) hair and dander: Secondary | ICD-10-CM | POA: Diagnosis not present

## 2019-03-25 DIAGNOSIS — J301 Allergic rhinitis due to pollen: Secondary | ICD-10-CM | POA: Diagnosis not present

## 2019-03-30 DIAGNOSIS — R69 Illness, unspecified: Secondary | ICD-10-CM | POA: Diagnosis not present

## 2019-03-31 DIAGNOSIS — R69 Illness, unspecified: Secondary | ICD-10-CM | POA: Diagnosis not present

## 2019-04-01 DIAGNOSIS — J3081 Allergic rhinitis due to animal (cat) (dog) hair and dander: Secondary | ICD-10-CM | POA: Diagnosis not present

## 2019-04-01 DIAGNOSIS — J301 Allergic rhinitis due to pollen: Secondary | ICD-10-CM | POA: Diagnosis not present

## 2019-04-01 DIAGNOSIS — J3089 Other allergic rhinitis: Secondary | ICD-10-CM | POA: Diagnosis not present

## 2019-04-02 DIAGNOSIS — E559 Vitamin D deficiency, unspecified: Secondary | ICD-10-CM | POA: Diagnosis not present

## 2019-04-02 DIAGNOSIS — Z125 Encounter for screening for malignant neoplasm of prostate: Secondary | ICD-10-CM | POA: Diagnosis not present

## 2019-04-02 DIAGNOSIS — Z1389 Encounter for screening for other disorder: Secondary | ICD-10-CM | POA: Diagnosis not present

## 2019-04-02 DIAGNOSIS — I10 Essential (primary) hypertension: Secondary | ICD-10-CM | POA: Diagnosis not present

## 2019-04-02 DIAGNOSIS — K219 Gastro-esophageal reflux disease without esophagitis: Secondary | ICD-10-CM | POA: Diagnosis not present

## 2019-04-02 DIAGNOSIS — Z0000 Encounter for general adult medical examination without abnormal findings: Secondary | ICD-10-CM | POA: Diagnosis not present

## 2019-04-02 DIAGNOSIS — J309 Allergic rhinitis, unspecified: Secondary | ICD-10-CM | POA: Diagnosis not present

## 2019-04-02 DIAGNOSIS — H8110 Benign paroxysmal vertigo, unspecified ear: Secondary | ICD-10-CM | POA: Diagnosis not present

## 2019-04-02 DIAGNOSIS — E785 Hyperlipidemia, unspecified: Secondary | ICD-10-CM | POA: Diagnosis not present

## 2019-04-08 DIAGNOSIS — J3081 Allergic rhinitis due to animal (cat) (dog) hair and dander: Secondary | ICD-10-CM | POA: Diagnosis not present

## 2019-04-08 DIAGNOSIS — J301 Allergic rhinitis due to pollen: Secondary | ICD-10-CM | POA: Diagnosis not present

## 2019-04-08 DIAGNOSIS — J3089 Other allergic rhinitis: Secondary | ICD-10-CM | POA: Diagnosis not present

## 2019-04-17 DIAGNOSIS — J3089 Other allergic rhinitis: Secondary | ICD-10-CM | POA: Diagnosis not present

## 2019-04-17 DIAGNOSIS — J3081 Allergic rhinitis due to animal (cat) (dog) hair and dander: Secondary | ICD-10-CM | POA: Diagnosis not present

## 2019-04-17 DIAGNOSIS — J301 Allergic rhinitis due to pollen: Secondary | ICD-10-CM | POA: Diagnosis not present

## 2019-04-22 DIAGNOSIS — J301 Allergic rhinitis due to pollen: Secondary | ICD-10-CM | POA: Diagnosis not present

## 2019-04-22 DIAGNOSIS — J3089 Other allergic rhinitis: Secondary | ICD-10-CM | POA: Diagnosis not present

## 2019-04-22 DIAGNOSIS — J3081 Allergic rhinitis due to animal (cat) (dog) hair and dander: Secondary | ICD-10-CM | POA: Diagnosis not present

## 2019-05-05 DIAGNOSIS — J3089 Other allergic rhinitis: Secondary | ICD-10-CM | POA: Diagnosis not present

## 2019-05-05 DIAGNOSIS — J3081 Allergic rhinitis due to animal (cat) (dog) hair and dander: Secondary | ICD-10-CM | POA: Diagnosis not present

## 2019-05-05 DIAGNOSIS — J301 Allergic rhinitis due to pollen: Secondary | ICD-10-CM | POA: Diagnosis not present

## 2019-05-20 DIAGNOSIS — J3081 Allergic rhinitis due to animal (cat) (dog) hair and dander: Secondary | ICD-10-CM | POA: Diagnosis not present

## 2019-05-20 DIAGNOSIS — J301 Allergic rhinitis due to pollen: Secondary | ICD-10-CM | POA: Diagnosis not present

## 2019-05-20 DIAGNOSIS — J3089 Other allergic rhinitis: Secondary | ICD-10-CM | POA: Diagnosis not present

## 2019-06-08 DIAGNOSIS — J3081 Allergic rhinitis due to animal (cat) (dog) hair and dander: Secondary | ICD-10-CM | POA: Diagnosis not present

## 2019-06-08 DIAGNOSIS — J301 Allergic rhinitis due to pollen: Secondary | ICD-10-CM | POA: Diagnosis not present

## 2019-06-08 DIAGNOSIS — J3089 Other allergic rhinitis: Secondary | ICD-10-CM | POA: Diagnosis not present

## 2019-06-24 DIAGNOSIS — J3089 Other allergic rhinitis: Secondary | ICD-10-CM | POA: Diagnosis not present

## 2019-06-24 DIAGNOSIS — J3081 Allergic rhinitis due to animal (cat) (dog) hair and dander: Secondary | ICD-10-CM | POA: Diagnosis not present

## 2019-06-24 DIAGNOSIS — J301 Allergic rhinitis due to pollen: Secondary | ICD-10-CM | POA: Diagnosis not present

## 2019-06-29 DIAGNOSIS — H5203 Hypermetropia, bilateral: Secondary | ICD-10-CM | POA: Diagnosis not present

## 2019-07-02 ENCOUNTER — Ambulatory Visit: Payer: Medicare HMO | Attending: Internal Medicine

## 2019-07-02 DIAGNOSIS — Z23 Encounter for immunization: Secondary | ICD-10-CM | POA: Insufficient documentation

## 2019-07-02 NOTE — Progress Notes (Signed)
   Covid-19 Vaccination Clinic  Name:  Juan Garrett    MRN: [id number] DOB: 05/16/1952  07/02/2019  [name] was observed post Covid-19 immunization for 15 minutes without incidence. He was provided with Vaccine Information Sheet and instruction to access the V-Safe system.   [name] was instructed to call 911 with any severe reactions post vaccine: Marland Kitchen Difficulty breathing  . Swelling of your face and throat  . A fast heartbeat  . A bad rash all over your body  . Dizziness and weakness    Immunizations Administered    Name Date Dose VIS Date Route   Pfizer COVID-19 Vaccine 07/02/2019 10:49 AM 0.3 mL 05/22/2019 Intramuscular   Manufacturer: [place]   Lot: [id number]   [place]: [id number]

## 2019-07-08 DIAGNOSIS — J3081 Allergic rhinitis due to animal (cat) (dog) hair and dander: Secondary | ICD-10-CM | POA: Diagnosis not present

## 2019-07-08 DIAGNOSIS — J3089 Other allergic rhinitis: Secondary | ICD-10-CM | POA: Diagnosis not present

## 2019-07-08 DIAGNOSIS — J301 Allergic rhinitis due to pollen: Secondary | ICD-10-CM | POA: Diagnosis not present

## 2019-07-22 DIAGNOSIS — J3081 Allergic rhinitis due to animal (cat) (dog) hair and dander: Secondary | ICD-10-CM | POA: Diagnosis not present

## 2019-07-22 DIAGNOSIS — J3089 Other allergic rhinitis: Secondary | ICD-10-CM | POA: Diagnosis not present

## 2019-07-22 DIAGNOSIS — J301 Allergic rhinitis due to pollen: Secondary | ICD-10-CM | POA: Diagnosis not present

## 2019-07-23 ENCOUNTER — Ambulatory Visit: Payer: Medicare HMO | Attending: Internal Medicine

## 2019-07-23 DIAGNOSIS — Z23 Encounter for immunization: Secondary | ICD-10-CM

## 2019-07-23 NOTE — Progress Notes (Signed)
   Covid-19 Vaccination Clinic  Name:  Juan Garrett    MRN: [id number] DOB: 1950/08/27  07/23/2019  Mr. Luikart was observed post Covid-19 immunization for 15 minutes without incidence. He was provided with Vaccine Information Sheet and instruction to access the V-Safe system.   Mr. Novakovic was instructed to call 911 with any severe reactions post vaccine: Marland Kitchen Difficulty breathing  . Swelling of your face and throat  . A fast heartbeat  . A bad rash all over your body  . Dizziness and weakness    Immunizations Administered    Name Date Dose VIS Date Route   Pfizer COVID-19 Vaccine 07/23/2019 11:20 AM 0.3 mL 05/22/2019 Intramuscular   Manufacturer: [place]   Lot: [id number]   [place]: [id number]

## 2019-08-19 ENCOUNTER — Other Ambulatory Visit: Payer: Self-pay

## 2019-08-19 ENCOUNTER — Ambulatory Visit (INDEPENDENT_AMBULATORY_CARE_PROVIDER_SITE_OTHER): Payer: Medicare HMO

## 2019-08-19 ENCOUNTER — Encounter: Payer: Self-pay | Admitting: Orthopaedic Surgery

## 2019-08-19 ENCOUNTER — Ambulatory Visit: Payer: Medicare HMO | Admitting: Orthopaedic Surgery

## 2019-08-19 VITALS — Ht 65.0 in | Wt 172.0 lb

## 2019-08-19 DIAGNOSIS — M25561 Pain in right knee: Secondary | ICD-10-CM | POA: Diagnosis not present

## 2019-08-19 MED ORDER — BUPIVACAINE HCL 0.5 % IJ SOLN
2.0000 mL | INTRAMUSCULAR | Status: AC | PRN
  Administered 2019-08-19: 2 mL via INTRA_ARTICULAR

## 2019-08-19 MED ORDER — METHYLPREDNISOLONE ACETATE 40 MG/ML IJ SUSP
40.0000 mg | INTRAMUSCULAR | Status: AC | PRN
  Administered 2019-08-19: 40 mg via INTRA_ARTICULAR

## 2019-08-19 MED ORDER — LIDOCAINE HCL 1 % IJ SOLN
2.0000 mL | INTRAMUSCULAR | Status: AC | PRN
  Administered 2019-08-19: 2 mL

## 2019-08-19 NOTE — Progress Notes (Signed)
Office Visit Note   Patient: Juan Garrett           Date of Birth: Sep 11, 1950           MRN: [id number] Visit Date: 08/19/2019              Requested by: No referring provider defined for this encounter. PCP: Wenda Low, MD   Assessment & Plan: Visit Diagnoses:  1. Acute pain of right knee     Plan: Impression is right knee pain suspect medial meniscal tear with chondromalacia.  Based on discussion we will go ahead and inject this with cortisone today and see how he responds over the next 2 to 4 weeks.  Patient will let us know if he does not notice any improvement so that we can obtain an MRI to fully evaluate for medial meniscal tear.  Otherwise he can follow-up as needed.  Follow-Up Instructions: Return if symptoms worsen or fail to improve.   Orders:  Orders Placed This Encounter  Procedures  . Large Joint Inj  . XR KNEE 3 VIEW RIGHT   No orders of the defined types were placed in this encounter.     Procedures: Large Joint Inj: R knee on 08/19/2019 4:55 PM Indications: pain Details: 22 G needle  Arthrogram: No  Medications: 40 mg methylPREDNISolone acetate 40 MG/ML; 2 mL lidocaine 1 %; 2 mL bupivacaine 0.5 % Consent was given by the patient. Patient was prepped and draped in the usual sterile fashion.       Clinical Data: No additional findings.   Subjective: Chief Complaint  Patient presents with  . Right Knee - Pain  . Left Knee - Pain    Juan Garrett comes in today for evaluation of right knee pain for a couple weeks.  Denies any injuries.  He has been limping as a result of the pain.  He feels pain mainly on the medial side of his knee.  He has he feels a shooting pain.  He does notice some swelling with increased activity and standing.  Denies any mechanical symptoms.  He works as a Art gallery manager.  He tried a copper fit knee brace which made the pain worse actually.   Review of Systems  Constitutional: Negative.   All other systems reviewed and  are negative.    Objective: Vital Signs: Ht 5\' 5"  (1.651 m)   Wt 172 lb (78 kg)   BMI 28.62 kg/m   Physical Exam Vitals and nursing note reviewed.  Constitutional:      Appearance: He is well-developed.  HENT:     Head: Normocephalic and atraumatic.  Eyes:     Pupils: Pupils are equal, round, and reactive to light.  Pulmonary:     Effort: Pulmonary effort is normal.  Abdominal:     Palpations: Abdomen is soft.  Musculoskeletal:        General: Normal range of motion.     Cervical back: Neck supple.  Skin:    General: Skin is warm.  Neurological:     Mental Status: He is alert and oriented to person, place, and time.  Psychiatric:        Behavior: Behavior normal.        Thought Content: Thought content normal.        Judgment: Judgment normal.     Ortho Exam Right knee exam shows no joint effusion.  Medial joint line tenderness and positive McMurray.  Collaterals and cruciates are stable.  Pain with terminal knee  flexion. Specialty Comments:  No specialty comments available.  Imaging: XR KNEE 3 VIEW RIGHT  Result Date: 08/19/2019 Mild to moderate DJD with periarticular spurring.    PMFS History: There are no problems to display for this patient.  No past medical history on file.  No family history on file.   Social History   Occupational History  . Not on file  Tobacco Use  . Smoking status: Former Research scientist (life sciences)  . Smokeless tobacco: Never Used  Substance and Sexual Activity  . Alcohol use: Not Currently  . Drug use: Not on file  . Sexual activity: Not on file

## 2019-08-24 ENCOUNTER — Encounter: Payer: Self-pay | Admitting: Orthopaedic Surgery

## 2019-08-25 ENCOUNTER — Other Ambulatory Visit: Payer: Self-pay

## 2019-08-25 DIAGNOSIS — G8929 Other chronic pain: Secondary | ICD-10-CM

## 2019-08-26 DIAGNOSIS — J3089 Other allergic rhinitis: Secondary | ICD-10-CM | POA: Diagnosis not present

## 2019-08-26 DIAGNOSIS — J301 Allergic rhinitis due to pollen: Secondary | ICD-10-CM | POA: Diagnosis not present

## 2019-08-26 DIAGNOSIS — J3081 Allergic rhinitis due to animal (cat) (dog) hair and dander: Secondary | ICD-10-CM | POA: Diagnosis not present

## 2019-09-02 ENCOUNTER — Other Ambulatory Visit: Payer: Self-pay

## 2019-09-02 ENCOUNTER — Ambulatory Visit (HOSPITAL_COMMUNITY)
Admission: RE | Admit: 2019-09-02 | Discharge: 2019-09-02 | Disposition: A | Discharge status: Home / Self Care | Payer: Medicare HMO | Source: Ambulatory Visit | Attending: Orthopaedic Surgery | Admitting: Orthopaedic Surgery

## 2019-09-02 DIAGNOSIS — M25561 Pain in right knee: Secondary | ICD-10-CM | POA: Diagnosis not present

## 2019-09-02 DIAGNOSIS — G8929 Other chronic pain: Secondary | ICD-10-CM

## 2019-09-02 DIAGNOSIS — S83241A Other tear of medial meniscus, current injury, right knee, initial encounter: Secondary | ICD-10-CM | POA: Diagnosis not present

## 2019-09-04 ENCOUNTER — Encounter: Payer: Self-pay | Admitting: Orthopaedic Surgery

## 2019-09-04 ENCOUNTER — Other Ambulatory Visit: Payer: Self-pay

## 2019-09-04 ENCOUNTER — Ambulatory Visit (INDEPENDENT_AMBULATORY_CARE_PROVIDER_SITE_OTHER): Payer: Medicare HMO | Admitting: Orthopaedic Surgery

## 2019-09-04 DIAGNOSIS — S83241A Other tear of medial meniscus, current injury, right knee, initial encounter: Secondary | ICD-10-CM | POA: Insufficient documentation

## 2019-09-04 NOTE — Progress Notes (Signed)
   Office Visit Note   Patient: Juan Garrett           Date of Birth: [birth year]           MRN: [id number] Visit Date: 09/04/2019              Requested by: Wenda Low, MD [address] [place],  [place] [address] PCP: Wenda Low, MD   Assessment & Plan: Visit Diagnoses:  1. Acute medial meniscal tear, right, initial encounter     Plan: MRI of the right knee shows a complex severe acute medial meniscus tear with some mild chondromalacia of the medial compartment.  These findings were reviewed with the patient in detail and recommendation based on his activity and lifestyle is for arthroscopic partial medial meniscectomy and debridement as indicated.  Risk benefits alternatives and rehab recovery reviewed as well today.  He will call us back when he is ready to schedule surgery.  Follow-Up Instructions: Return if symptoms worsen or fail to improve.   Orders:  No orders of the defined types were placed in this encounter.  No orders of the defined types were placed in this encounter.     Procedures: No procedures performed   Clinical Data: No additional findings.   Subjective: Chief Complaint  Patient presents with  . Right Knee - Pain    Mr. Kinkel returns today for MRI review of the right knee.  The cortisone injection has only helped minimally.  He continues to have medial sided knee pain with increasing activity.   Review of Systems   Objective: Vital Signs: There were no vitals taken for this visit.  Physical Exam  Ortho Exam Right knee exam is unchanged. Specialty Comments:  No specialty comments available.  Imaging: No results found.   PMFS History: Patient Active Problem List   Diagnosis Date Noted  . Acute medial meniscal tear, right, initial encounter 09/04/2019   History reviewed. No pertinent past medical history.  History reviewed. No pertinent family history.  History reviewed. No pertinent surgical  history. Social History   Occupational History  . Not on file  Tobacco Use  . Smoking status: Former Research scientist (life sciences)  . Smokeless tobacco: Never Used  Substance and Sexual Activity  . Alcohol use: Not Currently  . Drug use: Not on file  . Sexual activity: Not on file

## 2019-09-06 ENCOUNTER — Encounter: Payer: Self-pay | Admitting: Orthopaedic Surgery

## 2019-09-07 NOTE — Telephone Encounter (Signed)
PMM, cone day. He wants anytime after April 5th, preferably 8 or 9.  [id number].  Thanks.

## 2019-09-07 NOTE — Telephone Encounter (Signed)
Please provide surgery details/preferences. Thanks!

## 2019-09-09 DIAGNOSIS — J301 Allergic rhinitis due to pollen: Secondary | ICD-10-CM | POA: Diagnosis not present

## 2019-09-09 DIAGNOSIS — J3081 Allergic rhinitis due to animal (cat) (dog) hair and dander: Secondary | ICD-10-CM | POA: Diagnosis not present

## 2019-09-09 DIAGNOSIS — J3089 Other allergic rhinitis: Secondary | ICD-10-CM | POA: Diagnosis not present

## 2019-09-14 ENCOUNTER — Encounter (HOSPITAL_BASED_OUTPATIENT_CLINIC_OR_DEPARTMENT_OTHER): Payer: Self-pay | Admitting: Orthopaedic Surgery

## 2019-09-14 ENCOUNTER — Other Ambulatory Visit: Payer: Self-pay

## 2019-09-18 ENCOUNTER — Other Ambulatory Visit: Payer: Self-pay

## 2019-09-19 ENCOUNTER — Other Ambulatory Visit (HOSPITAL_COMMUNITY)
Admission: RE | Admit: 2019-09-19 | Discharge: 2019-09-19 | Disposition: A | Discharge status: Home / Self Care | Payer: Medicare HMO | Source: Ambulatory Visit | Attending: Orthopaedic Surgery | Admitting: Orthopaedic Surgery

## 2019-09-19 DIAGNOSIS — Z01812 Encounter for preprocedural laboratory examination: Secondary | ICD-10-CM | POA: Diagnosis not present

## 2019-09-19 DIAGNOSIS — Z20822 Contact with and (suspected) exposure to covid-19: Secondary | ICD-10-CM | POA: Diagnosis not present

## 2019-09-19 LAB — SARS CORONAVIRUS 2 (TAT 6-24 HRS): SARS Coronavirus 2: NEGATIVE

## 2019-09-23 ENCOUNTER — Ambulatory Visit (HOSPITAL_BASED_OUTPATIENT_CLINIC_OR_DEPARTMENT_OTHER)
Admission: RE | Admit: 2019-09-23 | Discharge: 2019-09-23 | Disposition: A | Discharge status: Home / Self Care | Payer: Medicare HMO | Attending: Orthopaedic Surgery | Admitting: Orthopaedic Surgery

## 2019-09-23 ENCOUNTER — Ambulatory Visit (HOSPITAL_BASED_OUTPATIENT_CLINIC_OR_DEPARTMENT_OTHER): Payer: Medicare HMO | Admitting: Certified Registered Nurse Anesthetist

## 2019-09-23 ENCOUNTER — Encounter: Payer: Self-pay | Admitting: Orthopaedic Surgery

## 2019-09-23 ENCOUNTER — Encounter (HOSPITAL_BASED_OUTPATIENT_CLINIC_OR_DEPARTMENT_OTHER): Payer: Self-pay | Admitting: Orthopaedic Surgery

## 2019-09-23 ENCOUNTER — Encounter (HOSPITAL_BASED_OUTPATIENT_CLINIC_OR_DEPARTMENT_OTHER)
Admission: RE | Disposition: A | Discharge status: Home / Self Care | Payer: Self-pay | Source: Home / Self Care | Attending: Orthopaedic Surgery

## 2019-09-23 ENCOUNTER — Other Ambulatory Visit: Payer: Self-pay

## 2019-09-23 DIAGNOSIS — S83231A Complex tear of medial meniscus, current injury, right knee, initial encounter: Secondary | ICD-10-CM | POA: Insufficient documentation

## 2019-09-23 DIAGNOSIS — S83241A Other tear of medial meniscus, current injury, right knee, initial encounter: Secondary | ICD-10-CM

## 2019-09-23 DIAGNOSIS — Z87891 Personal history of nicotine dependence: Secondary | ICD-10-CM | POA: Diagnosis not present

## 2019-09-23 DIAGNOSIS — X58XXXA Exposure to other specified factors, initial encounter: Secondary | ICD-10-CM | POA: Diagnosis not present

## 2019-09-23 DIAGNOSIS — Z79899 Other long term (current) drug therapy: Secondary | ICD-10-CM | POA: Insufficient documentation

## 2019-09-23 DIAGNOSIS — I10 Essential (primary) hypertension: Secondary | ICD-10-CM | POA: Diagnosis not present

## 2019-09-23 DIAGNOSIS — M659 Synovitis and tenosynovitis, unspecified: Secondary | ICD-10-CM | POA: Insufficient documentation

## 2019-09-23 DIAGNOSIS — M94261 Chondromalacia, right knee: Secondary | ICD-10-CM | POA: Diagnosis not present

## 2019-09-23 DIAGNOSIS — K219 Gastro-esophageal reflux disease without esophagitis: Secondary | ICD-10-CM | POA: Diagnosis not present

## 2019-09-23 HISTORY — DX: Gastro-esophageal reflux disease without esophagitis: K21.9

## 2019-09-23 HISTORY — PX: KNEE ARTHROSCOPY WITH MEDIAL MENISECTOMY: SHX5651

## 2019-09-23 HISTORY — DX: Essential (primary) hypertension: I10

## 2019-09-23 HISTORY — PX: CHONDROPLASTY: SHX5177

## 2019-09-23 SURGERY — ARTHROSCOPY, KNEE, WITH MEDIAL MENISCECTOMY
Anesthesia: General | Site: Knee | Laterality: Right

## 2019-09-23 MED ORDER — HYDROCODONE-ACETAMINOPHEN 5-325 MG PO TABS: 1.0000 | ORAL_TABLET | Freq: Three times a day (TID) | ORAL | 0 refills | Status: DC | PRN

## 2019-09-23 MED ORDER — LIDOCAINE 2% (20 MG/ML) 5 ML SYRINGE
INTRAMUSCULAR | Status: AC
  Filled 2019-09-23: qty 5

## 2019-09-23 MED ORDER — PHENYLEPHRINE 40 MCG/ML (10ML) SYRINGE FOR IV PUSH (FOR BLOOD PRESSURE SUPPORT)
PREFILLED_SYRINGE | INTRAVENOUS | Status: DC | PRN
  Administered 2019-09-23: 160 ug via INTRAVENOUS
  Administered 2019-09-23: 120 ug via INTRAVENOUS

## 2019-09-23 MED ORDER — MIDAZOLAM HCL 2 MG/2ML IJ SOLN: 1.0000 mg | INTRAMUSCULAR | Status: DC | PRN

## 2019-09-23 MED ORDER — BUPIVACAINE HCL (PF) 0.25 % IJ SOLN
INTRAMUSCULAR | Status: DC | PRN
  Administered 2019-09-23: 20 mL

## 2019-09-23 MED ORDER — FENTANYL CITRATE (PF) 100 MCG/2ML IJ SOLN: 25.0000 ug | INTRAMUSCULAR | Status: DC | PRN

## 2019-09-23 MED ORDER — ONDANSETRON HCL 4 MG PO TABS: 4.0000 mg | ORAL_TABLET | Freq: Three times a day (TID) | ORAL | 0 refills | Status: DC | PRN

## 2019-09-23 MED ORDER — ACETAMINOPHEN 500 MG PO TABS
1000.0000 mg | ORAL_TABLET | Freq: Once | ORAL | Status: AC
  Administered 2019-09-23: 1000 mg via ORAL

## 2019-09-23 MED ORDER — ONDANSETRON HCL 4 MG/2ML IJ SOLN
INTRAMUSCULAR | Status: DC | PRN
  Administered 2019-09-23: 4 mg via INTRAVENOUS

## 2019-09-23 MED ORDER — DEXAMETHASONE SODIUM PHOSPHATE 10 MG/ML IJ SOLN
INTRAMUSCULAR | Status: DC | PRN
  Administered 2019-09-23: 10 mg via INTRAVENOUS

## 2019-09-23 MED ORDER — FENTANYL CITRATE (PF) 100 MCG/2ML IJ SOLN
INTRAMUSCULAR | Status: AC
  Filled 2019-09-23: qty 2

## 2019-09-23 MED ORDER — BUPIVACAINE HCL (PF) 0.25 % IJ SOLN
INTRAMUSCULAR | Status: AC
  Filled 2019-09-23: qty 30

## 2019-09-23 MED ORDER — LACTATED RINGERS IV SOLN: INTRAVENOUS | Status: DC

## 2019-09-23 MED ORDER — DEXAMETHASONE SODIUM PHOSPHATE 10 MG/ML IJ SOLN
INTRAMUSCULAR | Status: AC
  Filled 2019-09-23: qty 1

## 2019-09-23 MED ORDER — PROPOFOL 500 MG/50ML IV EMUL
INTRAVENOUS | Status: AC
  Filled 2019-09-23: qty 50

## 2019-09-23 MED ORDER — PROPOFOL 10 MG/ML IV BOLUS
INTRAVENOUS | Status: DC | PRN
  Administered 2019-09-23: 160 mg via INTRAVENOUS

## 2019-09-23 MED ORDER — CEFAZOLIN SODIUM-DEXTROSE 2-4 GM/100ML-% IV SOLN
INTRAVENOUS | Status: AC
  Filled 2019-09-23: qty 100

## 2019-09-23 MED ORDER — FENTANYL CITRATE (PF) 100 MCG/2ML IJ SOLN: 50.0000 ug | INTRAMUSCULAR | Status: DC | PRN

## 2019-09-23 MED ORDER — ACETAMINOPHEN 500 MG PO TABS
ORAL_TABLET | ORAL | Status: AC
  Filled 2019-09-23: qty 2

## 2019-09-23 MED ORDER — LIDOCAINE 2% (20 MG/ML) 5 ML SYRINGE
INTRAMUSCULAR | Status: DC | PRN
  Administered 2019-09-23: 60 mg via INTRAVENOUS

## 2019-09-23 MED ORDER — ONDANSETRON HCL 4 MG/2ML IJ SOLN
INTRAMUSCULAR | Status: AC
  Filled 2019-09-23: qty 2

## 2019-09-23 MED ORDER — CEFAZOLIN SODIUM-DEXTROSE 2-4 GM/100ML-% IV SOLN
2.0000 g | INTRAVENOUS | Status: AC
  Administered 2019-09-23: 2 g via INTRAVENOUS

## 2019-09-23 MED ORDER — FENTANYL CITRATE (PF) 100 MCG/2ML IJ SOLN
INTRAMUSCULAR | Status: DC | PRN
  Administered 2019-09-23: 50 ug via INTRAVENOUS
  Administered 2019-09-23 (×2): 25 ug via INTRAVENOUS

## 2019-09-23 SURGICAL SUPPLY — 32 items
BANDAGE ESMARK 6X9 LF (GAUZE/BANDAGES/DRESSINGS) IMPLANT
BLADE SHAVER TORPEDO 4X13 (MISCELLANEOUS) IMPLANT
BNDG ELASTIC 6X5.8 VLCR STR LF (GAUZE/BANDAGES/DRESSINGS) ×4 IMPLANT
BNDG ESMARK 6X9 LF (GAUZE/BANDAGES/DRESSINGS)
COVER WAND RF STERILE (DRAPES) IMPLANT
CUFF TOURN SGL QUICK 34 (TOURNIQUET CUFF) ×1
CUFF TRNQT CYL 34X4.125X (TOURNIQUET CUFF) ×1 IMPLANT
DRAPE ARTHROSCOPY W/POUCH 90 (DRAPES) ×2 IMPLANT
DRAPE IMP U-DRAPE 54X76 (DRAPES) ×2 IMPLANT
DRAPE U-SHAPE 47X51 STRL (DRAPES) ×2 IMPLANT
DURAPREP 26ML APPLICATOR (WOUND CARE) ×2 IMPLANT
GAUZE SPONGE 4X4 12PLY STRL (GAUZE/BANDAGES/DRESSINGS) ×2 IMPLANT
GAUZE XEROFORM 1X8 LF (GAUZE/BANDAGES/DRESSINGS) ×2 IMPLANT
GLOVE BIOGEL PI IND STRL 7.0 (GLOVE) ×1 IMPLANT
GLOVE BIOGEL PI INDICATOR 7.0 (GLOVE) ×1
GLOVE ECLIPSE 7.0 STRL STRAW (GLOVE) ×2 IMPLANT
GLOVE SKINSENSE NS SZ7.5 (GLOVE) ×1
GLOVE SKINSENSE STRL SZ7.5 (GLOVE) ×1 IMPLANT
GLOVE SURG SYN 7.5  E (GLOVE) ×1
GLOVE SURG SYN 7.5 E (GLOVE) ×1 IMPLANT
GOWN STRL REIN XL XLG (GOWN DISPOSABLE) ×2 IMPLANT
GOWN STRL REUS W/ TWL LRG LVL3 (GOWN DISPOSABLE) ×1 IMPLANT
GOWN STRL REUS W/ TWL XL LVL3 (GOWN DISPOSABLE) ×1 IMPLANT
GOWN STRL REUS W/TWL LRG LVL3 (GOWN DISPOSABLE) ×1
GOWN STRL REUS W/TWL XL LVL3 (GOWN DISPOSABLE) ×1
KNEE WRAP E Z 3 GEL PACK (MISCELLANEOUS) ×2 IMPLANT
MANIFOLD NEPTUNE II (INSTRUMENTS) ×2 IMPLANT
PACK ARTHROSCOPY DSU (CUSTOM PROCEDURE TRAY) ×2 IMPLANT
PACK BASIN DAY SURGERY FS (CUSTOM PROCEDURE TRAY) ×2 IMPLANT
SUT ETHILON 3 0 PS 1 (SUTURE) ×2 IMPLANT
TOWEL GREEN STERILE FF (TOWEL DISPOSABLE) ×2 IMPLANT
TUBING ARTHROSCOPY IRRIG 16FT (MISCELLANEOUS) ×2 IMPLANT

## 2019-09-23 NOTE — Transfer of Care (Signed)
Immediate Anesthesia Transfer of Care Note  Patient: Juan Garrett  Procedure(s) Performed: RIGHT KNEE ARTHROSCOPY WITH PARTIAL MEDIAL MENISCECTOMY (Right Knee) CHONDROPLASTY (Right Knee)  Patient Location: PACU  Anesthesia Type:General  Level of Consciousness: awake, alert  and oriented  Airway & Oxygen Therapy: Patient Spontanous Breathing and Patient connected to face mask oxygen  Post-op Assessment: Report given to RN and Post -op Vital signs reviewed and stable  Post vital signs: Reviewed and stable  Last Vitals:  Vitals Value Taken Time  BP 111/76 09/23/19 0911  Temp    Pulse 65 09/23/19 0912  Resp 11 09/23/19 0912  SpO2 99 % 09/23/19 0912  Vitals shown include unvalidated device data.  Last Pain:  Vitals:   09/23/19 0730  TempSrc: Oral      Patients Stated Pain Goal: 6 ([id number])  Complications: No apparent anesthesia complications

## 2019-09-23 NOTE — Discharge Instructions (Signed)
  Post-operative patient instructions  Knee Arthroscopy   . Ice:  Place intermittent ice or cooler pack over your knee, 30 minutes on and 30 minutes off.  Continue this for the first 72 hours after surgery, then save ice for use after therapy sessions or on more active days.   . Weight:  You may bear weight on your leg as your symptoms allow. . Crutches:  Use crutches (or walker) to assist in walking until told to discontinue by your physical therapist or physician. This will help to reduce pain. . Strengthening:  Perform simple thigh squeezes (isometric quad contractions) and straight leg lifts as you are able (3 sets of 5 to 10 repetitions, 3 times a day).  For the leg lifts, have someone support under your ankle in the beginning until you have increased strength enough to do this on your own.  To help get started on thigh squeezes, place a pillow under your knee and push down on the pillow with back of knee (sometimes easier to do than with your leg fully straight). . Motion:  Perform gentle knee motion as tolerated - this is gentle bending and straightening of the knee. Seated heel slides: you can start by sitting in a chair, remove your brace, and gently slide your heel back on the floor - allowing your knee to bend. Have someone help you straighten your knee (or use your other leg/foot hooked under your ankle.  . Dressing:  Perform 1st dressing change at 2 days postoperative. A moderate amount of blood tinged drainage is to be expected.  So if you bleed through the dressing on the first or second day or if you have fevers, it is fine to change the dressing/check the wounds early and redress wound. Elevate your leg.  If it bleeds through again, or if the incisions are leaking frank blood, please call the office. May change dressing every 1-2 days thereafter to help watch wounds. Can purchase Tegaderm (or 3M Nexcare) water resistant dressings at local pharmacy / [facility]. . Shower:  Light shower is  ok after 2 days.  Please take shower, NO bath. Recover with gauze and ace wrap to help keep wounds protected.   . Pain medication:  A narcotic pain medication has been prescribed.  Take as directed.  Typically you need narcotic pain medication more regularly during the first 3 to 5 days after surgery.  Decrease your use of the medication as the pain improves.  Narcotics can sometimes cause constipation, even after a few doses.  If you have problems with constipation, you can take an over the counter stool softener or light laxative.  If you have persistent problems, please notify your physician's office. . Physical therapy: Additional activity guidelines to be provided by your physician or physical therapist at follow-up visits.  . Driving: Do not recommend driving x 2 weeks post surgical, especially if surgery performed on right side. Should not drive while taking narcotic pain medications. It typically takes at least 2 weeks to restore sufficient neuromuscular function for normal reaction times for driving safety.  . Call [phone] for questions or problems. Evenings you will be forwarded to the hospital operator.  Ask for the orthopaedic physician on call. Please call if you experience:    o Redness, foul smelling, or persistent drainage from the surgical site  o worsening knee pain and swelling not responsive to medication  o any calf pain and or swelling of the lower leg  o temperatures greater than   101.5 F o other questions or concerns   Thank you for allowing [place] to be a part of your care. Tylenol 1000mg  was given at [facility] Instructions  Activity: Get plenty of rest for the remainder of the day. A responsible individual must stay with you for 24 hours following the procedure.  For the next 24 hours, DO NOT: -Drive a car -Paediatric nurse -Drink alcoholic beverages -Take any medication unless instructed by your physician -Make any legal decisions or sign  important papers.  Meals: Start with liquid foods such as gelatin or soup. Progress to regular foods as tolerated. Avoid greasy, spicy, heavy foods. If nausea and/or vomiting occur, drink only clear liquids until the nausea and/or vomiting subsides. Call your physician if vomiting continues.  Special Instructions/Symptoms: Your throat may feel dry or sore from the anesthesia or the breathing tube placed in your throat during surgery. If this causes discomfort, gargle with warm salt water. The discomfort should disappear within 24 hours.  If you had a scopolamine patch placed behind your ear for the management of post- operative nausea and/or vomiting:  1. The medication in the patch is effective for 72 hours, after which it should be removed.  Wrap patch in a tissue and discard in the trash. Wash hands thoroughly with soap and water. 2. You may remove the patch earlier than 72 hours if you experience unpleasant side effects which may include dry mouth, dizziness or visual disturbances. 3. Avoid touching the patch. Wash your hands with soap and water after contact with the patch.

## 2019-09-23 NOTE — Anesthesia Postprocedure Evaluation (Signed)
Anesthesia Post Note  Patient: Andrzej Preast Bake  Procedure(s) Performed: RIGHT KNEE ARTHROSCOPY WITH PARTIAL MEDIAL MENISCECTOMY (Right Knee) CHONDROPLASTY (Right Knee)     Patient location during evaluation: PACU Anesthesia Type: General Level of consciousness: awake and alert Pain management: pain level controlled Vital Signs Assessment: post-procedure vital signs reviewed and stable Respiratory status: spontaneous breathing, nonlabored ventilation, respiratory function stable and patient connected to nasal cannula oxygen Cardiovascular status: blood pressure returned to baseline and stable Postop Assessment: no apparent nausea or vomiting Anesthetic complications: no    Last Vitals:  Vitals:   09/23/19 0930 09/23/19 0950  BP: 112/73 118/77  Pulse: 61 62  Resp: 11 16  Temp:  36.7 C  SpO2: 97% 99%    Last Pain:  Vitals:   09/23/19 0950  TempSrc:   PainSc: 0-No pain                 Manisha Cancel L Mardene Lessig

## 2019-09-23 NOTE — H&P (Signed)
PREOPERATIVE H&P  Chief Complaint: acute right knee medial meniscal tear  HPI: Juan Garrett is a 69 y.o. male who presents for surgical treatment of acute right knee medial meniscal tear.  He denies any changes in medical history.  Past Medical History:  Diagnosis Date  . GERD (gastroesophageal reflux disease)   . Hypertension    Past Surgical History:  Procedure Laterality Date  . CARPAL TUNNEL RELEASE Bilateral 1997  . KNEE SURGERY Left 1992   Social History   Socioeconomic History  . Marital status: Married    Spouse name: Not on file  . Number of children: Not on file  . Years of education: Not on file  . Highest education level: Not on file  Occupational History  . Not on file  Tobacco Use  . Smoking status: Former Research scientist (life sciences)  . Smokeless tobacco: Never Used  Substance and Sexual Activity  . Alcohol use: Not Currently  . Drug use: Never  . Sexual activity: Not on file  Other Topics Concern  . Not on file  Social History Narrative  . Not on file   Social Determinants of Health   Financial Resource Strain:   . Difficulty of Paying Living Expenses:   Food Insecurity:   . Worried About Charity fundraiser in the Last Year:   . Arboriculturist in the Last Year:   Transportation Needs:   . Film/video editor (Medical):   Marland Kitchen Lack of Transportation (Non-Medical):   Physical Activity:   . Days of Exercise per Week:   . Minutes of Exercise per Session:   Stress:   . Feeling of Stress :   Social Connections:   . Frequency of Communication with Friends and Family:   . Frequency of Social Gatherings with Friends and Family:   . Attends Religious Services:   . Active Member of Clubs or Organizations:   . Attends Archivist Meetings:   Marland Kitchen Marital Status:    History reviewed. No pertinent family history. No Known Allergies Prior to Admission medications   Medication Sig Start Date End Date Taking? Authorizing Provider   amLODipine-benazepril (LOTREL) 5-10 MG capsule Take 1 capsule by mouth daily. 08/17/19  Yes [provider]  famotidine (PEPCID) 40 MG tablet Take 40 mg by mouth daily. 07/31/19  Yes [provider]  IBU 800 MG tablet  08/07/19  Yes [provider]  meclizine (ANTIVERT) 25 MG tablet  04/02/19  Yes [provider]  Vitamin D, Ergocalciferol, (DRISDOL) 1.25 MG (50000 UNIT) CAPS capsule  05/29/19  Yes [provider]     Positive ROS: All other systems have been reviewed and were otherwise negative with the exception of those mentioned in the HPI and as above.  Physical Exam: General: Alert, no acute distress Cardiovascular: No pedal edema Respiratory: No cyanosis, no use of accessory musculature GI: abdomen soft Skin: No lesions in the area of chief complaint Neurologic: Sensation intact distally Psychiatric: Patient is competent for consent with normal mood and affect Lymphatic: no lymphedema  MUSCULOSKELETAL: exam stable  Assessment: acute right knee medial meniscal tear  Plan: Plan for Procedure(s): RIGHT KNEE ARTHROSCOPY WITH PARTIAL MEDIAL MENISCECTOMY  The risks benefits and alternatives were discussed with the patient including but not limited to the risks of nonoperative treatment, versus surgical intervention including infection, bleeding, nerve injury,  blood clots, cardiopulmonary complications, morbidity, mortality, among others, and they were willing to proceed.    Eduard Roux, MD  09/23/2019 8:12 AM

## 2019-09-23 NOTE — Anesthesia Procedure Notes (Signed)
Procedure Name: LMA Insertion Date/Time: 09/23/2019 8:27 AM Performed by: [place] Jemari Hallum C, CRNA Pre-anesthesia Checklist: Patient identified, Emergency Drugs available, Suction available and Patient being monitored Patient Re-evaluated:Patient Re-evaluated prior to induction Oxygen Delivery Method: Circle system utilized Preoxygenation: Pre-oxygenation with 100% oxygen Induction Type: IV induction Ventilation: Mask ventilation without difficulty LMA: LMA inserted LMA Size: 4.0 Number of attempts: 1 Airway Equipment and Method: Bite block Placement Confirmation: positive ETCO2 Tube secured with: Tape Dental Injury: Teeth and Oropharynx as per pre-operative assessment

## 2019-09-23 NOTE — Op Note (Signed)
   Surgery Date: 09/23/2019  PREOPERATIVE DIAGNOSES:  1. Right knee medial meniscus tear 2. Right knee synovitis  POSTOPERATIVE DIAGNOSES:  same  PROCEDURES PERFORMED:  1. Right knee arthroscopy with major synovectomy 2. Right knee arthroscopy with arthroscopic partial medial meniscectomy 3. Right knee arthroscopy with arthroscopic chondroplasty medial femoral condyle and medial tibial plateau, femoral trochlea  SURGEON: N. Eduard Roux, M.D.  ASSIST: Ciro Backer [place], [place]; necessary for the timely completion of procedure and due to complexity of procedure.  ANESTHESIA:  general  FLUIDS: Per anesthesia record.   ESTIMATED BLOOD LOSS: minimal  DESCRIPTION OF PROCEDURE: Mr. Juan Garrett is a 69 y.o.-year-old male with right knee medial meniscus tear. Plans are to proceed with partial medial meniscectomy and diagnostic arthroscopy with debridement as indicated. Full discussion held regarding risks benefits alternatives and complications related surgical intervention. Conservative care options reviewed. All questions answered.  The patient was identified in the preoperative holding area and the operative extremity was marked. The patient was brought to the operating room and transferred to operating table in a supine position. Satisfactory general anesthesia was induced by anesthesiology.    Standard anterolateral, anteromedial arthroscopy portals were obtained. The anteromedial portal was obtained with a spinal needle for localization under direct visualization with subsequent diagnostic findings.   Incisions were made for arthroscopic portals.  The diagnostic knee arthroscopy was first performed.  There was moderate and inflamed synovitis throughout the knee.  A major synovectomy was performed in all 3 compartments of the knee with oscillating shaver.  I then placed a gentle valgus stress on the knee in order to address the medial compartment.  He had widespread grade 3 and grade IV  chondromalacia on the medial tibial plateau and grade III chondromalacia of the medial femoral condyle.  I we are also able to identify the complex tear of the medial meniscus that extended from the mid body all the way to just medial to the meniscal root.  The root itself was intact.  Partial medial meniscectomy was performed using a combination of the meniscus basket and oscillating shaver back to stable border.  After the partial medial meniscectomy and the torn area there was not much of residual meniscus remaining.  We then reposition the arthroscope into the femoral notch and the lateral compartment which were all unremarkable with some mild chondromalacia.  The knee was then fully extended in the patellofemoral compartment was evaluated.  The gutters were checked for loose bodies.  Gentle chondroplasty of the femoral trochlea was performed.  Excess fluid was removed from the knee joint.  Incisions were closed with interrupted nylon sutures.  Sterile dressings were applied.  Patient tolerated procedure well had no immediate complications.  Suprapatellar pouch and gutters: moderate synovitis or debris. Patella chondral surface: Grade 3 Trochlear chondral surface: Grade 4 Patellofemoral tracking: normal Medial meniscus: complex tear.  Medial femoral condyle weight bearing surface: Grade 3 Medial tibial plateau: Grade 4 Anterior cruciate ligament:stable Posterior cruciate ligament:stable Lateral meniscus: normal.   Lateral femoral condyle weight bearing surface: Grade 1 Lateral tibial plateau: Grade 1  DISPOSITION: The patient was awakened from general anesthetic, extubated, taken to the recovery room in medically stable condition, no apparent complications. The patient may be weightbearing as tolerated to the operative lower extremity.  Range of motion of right knee as tolerated.  Azucena Cecil, MD [facility] 9:03 AM

## 2019-09-23 NOTE — Anesthesia Preprocedure Evaluation (Addendum)
Anesthesia Evaluation  Patient identified by MRN, date of birth, ID band Patient awake    Reviewed: Allergy & Precautions, NPO status , Patient's Chart, lab work & pertinent test results  Airway Mallampati: I  TM Distance: >3 FB Neck ROM: Full    Dental no notable dental hx. (+) Teeth Intact, Dental Advisory Given   Pulmonary neg pulmonary ROS, former smoker,    Pulmonary exam normal breath sounds clear to auscultation       Cardiovascular hypertension, Pt. on medications Normal cardiovascular exam Rhythm:Regular Rate:Normal     Neuro/Psych negative neurological ROS  negative psych ROS   GI/Hepatic Neg liver ROS, GERD  Medicated,  Endo/Other  negative endocrine ROS  Renal/GU negative Renal ROS  negative genitourinary   Musculoskeletal negative musculoskeletal ROS (+)   Abdominal   Peds  Hematology negative hematology ROS (+)   Anesthesia Other Findings   Reproductive/Obstetrics                            Anesthesia Physical Anesthesia Plan  ASA: II  Anesthesia Plan: General   Post-op Pain Management:    Induction: Intravenous  PONV Risk Score and Plan: 2 and Ondansetron, Dexamethasone and Midazolam  Airway Management Planned: LMA  Additional Equipment:   Intra-op Plan:   Post-operative Plan: Extubation in OR  Informed Consent: I have reviewed the patients History and Physical, chart, labs and discussed the procedure including the risks, benefits and alternatives for the proposed anesthesia with the patient or authorized representative who has indicated his/her understanding and acceptance.     Dental advisory given  Plan Discussed with: CRNA  Anesthesia Plan Comments:         Anesthesia Quick Evaluation

## 2019-09-24 DIAGNOSIS — J301 Allergic rhinitis due to pollen: Secondary | ICD-10-CM | POA: Diagnosis not present

## 2019-09-24 DIAGNOSIS — J3089 Other allergic rhinitis: Secondary | ICD-10-CM | POA: Diagnosis not present

## 2019-09-24 DIAGNOSIS — J3081 Allergic rhinitis due to animal (cat) (dog) hair and dander: Secondary | ICD-10-CM | POA: Diagnosis not present

## 2019-09-25 ENCOUNTER — Encounter: Payer: Self-pay | Admitting: *Deleted

## 2019-09-30 ENCOUNTER — Inpatient Hospital Stay: Payer: Medicare HMO | Admitting: Orthopaedic Surgery

## 2019-09-30 ENCOUNTER — Other Ambulatory Visit: Payer: Self-pay

## 2019-09-30 ENCOUNTER — Ambulatory Visit (INDEPENDENT_AMBULATORY_CARE_PROVIDER_SITE_OTHER): Payer: Medicare HMO | Admitting: Orthopaedic Surgery

## 2019-09-30 ENCOUNTER — Encounter: Payer: Self-pay | Admitting: Orthopaedic Surgery

## 2019-09-30 VITALS — Ht 65.0 in | Wt 175.0 lb

## 2019-09-30 DIAGNOSIS — S83241A Other tear of medial meniscus, current injury, right knee, initial encounter: Secondary | ICD-10-CM

## 2019-09-30 NOTE — Progress Notes (Signed)
   Post-Op Visit Note   Patient: Aaqib Freudenberg           Date of Birth: [birth year]           MRN: [id number] Visit Date: 09/30/2019 PCP: Wenda Low, MD   Assessment & Plan:  Chief Complaint:  Chief Complaint  Patient presents with  . Right Knee - Routine Post Op    09/23/2019 Right Knee Arthroscopy with PMM, Chondroplasty   Visit Diagnoses:  1. Acute medial meniscal tear, right, initial encounter     Plan: Mr. Calkin is 1 week status post partial medial meniscectomy.  He is doing well overall.  He reports minimal pain.  Just a little soreness and stiffness.  He is not taking any pain medications.  His sutures were removed today.  No signs of infection.  I provided him with home exercises.  He will try to go to work for a couple days on Friday and see how he does.  He will increase activity as tolerated.  We will see him back in about 4 weeks for recheck.  Follow-Up Instructions: Return in about 4 weeks (around 10/28/2019).   Orders:  No orders of the defined types were placed in this encounter.  No orders of the defined types were placed in this encounter.   Imaging: No results found.  PMFS History: Patient Active Problem List   Diagnosis Date Noted  . Acute medial meniscal tear, right, initial encounter 09/04/2019   Past Medical History:  Diagnosis Date  . GERD (gastroesophageal reflux disease)   . Hypertension     No family history on file.  Past Surgical History:  Procedure Laterality Date  . CARPAL TUNNEL RELEASE Bilateral 1997  . CHONDROPLASTY Right 09/23/2019   Procedure: CHONDROPLASTY;  Surgeon: Leandrew Koyanagi, MD;  Location: [facility];  Service: Orthopedics;  Laterality: Right;  . KNEE ARTHROSCOPY WITH MEDIAL MENISECTOMY Right 09/23/2019   Procedure: RIGHT KNEE ARTHROSCOPY WITH PARTIAL MEDIAL MENISCECTOMY;  Surgeon: Leandrew Koyanagi, MD;  Location: [facility];  Service: Orthopedics;  Laterality: Right;  . KNEE SURGERY Left  1992   Social History   Occupational History  . Not on file  Tobacco Use  . Smoking status: Former Research scientist (life sciences)  . Smokeless tobacco: Never Used  Substance and Sexual Activity  . Alcohol use: Not Currently  . Drug use: Never  . Sexual activity: Not on file

## 2019-10-07 DIAGNOSIS — J3081 Allergic rhinitis due to animal (cat) (dog) hair and dander: Secondary | ICD-10-CM | POA: Diagnosis not present

## 2019-10-07 DIAGNOSIS — J301 Allergic rhinitis due to pollen: Secondary | ICD-10-CM | POA: Diagnosis not present

## 2019-10-07 DIAGNOSIS — J3089 Other allergic rhinitis: Secondary | ICD-10-CM | POA: Diagnosis not present

## 2019-10-21 DIAGNOSIS — J3081 Allergic rhinitis due to animal (cat) (dog) hair and dander: Secondary | ICD-10-CM | POA: Diagnosis not present

## 2019-10-21 DIAGNOSIS — J301 Allergic rhinitis due to pollen: Secondary | ICD-10-CM | POA: Diagnosis not present

## 2019-10-21 DIAGNOSIS — J3089 Other allergic rhinitis: Secondary | ICD-10-CM | POA: Diagnosis not present

## 2019-10-28 ENCOUNTER — Ambulatory Visit: Payer: Medicare HMO | Admitting: Orthopaedic Surgery

## 2019-10-28 ENCOUNTER — Encounter: Payer: Self-pay | Admitting: Orthopaedic Surgery

## 2019-10-28 ENCOUNTER — Other Ambulatory Visit: Payer: Self-pay

## 2019-10-28 DIAGNOSIS — S83241A Other tear of medial meniscus, current injury, right knee, initial encounter: Secondary | ICD-10-CM

## 2019-10-28 DIAGNOSIS — M25461 Effusion, right knee: Secondary | ICD-10-CM | POA: Diagnosis not present

## 2019-10-28 MED ORDER — BUPIVACAINE HCL 0.5 % IJ SOLN
2.0000 mL | INTRAMUSCULAR | Status: AC | PRN
  Administered 2019-10-28: 2 mL via INTRA_ARTICULAR

## 2019-10-28 MED ORDER — METHYLPREDNISOLONE ACETATE 40 MG/ML IJ SUSP
40.0000 mg | INTRAMUSCULAR | Status: AC | PRN
  Administered 2019-10-28: 40 mg via INTRA_ARTICULAR

## 2019-10-28 MED ORDER — LIDOCAINE HCL 1 % IJ SOLN
2.0000 mL | INTRAMUSCULAR | Status: AC | PRN
  Administered 2019-10-28: 2 mL

## 2019-10-28 NOTE — Progress Notes (Signed)
   Office Visit Note   Patient: Juan Garrett           Date of Birth: Nov 28, 1950           MRN: [id number] Visit Date: 10/28/2019              Requested by: Wenda Low, MD [address] [place],  [place] [address] PCP: Wenda Low, MD   Assessment & Plan: Visit Diagnoses:  1. Acute medial meniscal tear, right, initial encounter   2. Effusion, right knee     Plan: Impression is 5 weeks status post right knee scope partial medial meniscectomy.  He has an effusion today which we aspirated and injected with cortisone.  He is to take it easy over the next week or so.  He may increase activity as tolerated after that.  Follow-up as needed.  Follow-Up Instructions: Return if symptoms worsen or fail to improve.   Orders:  No orders of the defined types were placed in this encounter.  No orders of the defined types were placed in this encounter.     Procedures: Large Joint Inj: R knee on 10/28/2019 3:50 PM Indications: pain Details: 22 G needle  Arthrogram: No  Medications: 40 mg methylPREDNISolone acetate 40 MG/ML; 2 mL lidocaine 1 %; 2 mL bupivacaine 0.5 % Aspirate: 35 mL Consent was given by the patient. Patient was prepped and draped in the usual sterile fashion.       Clinical Data: No additional findings.   Subjective: Chief Complaint  Patient presents with  . Right Knee - Pain    Mr. Juan Garrett is 5 weeks status post right knee scope partial medial meniscectomy.  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  He is having some swelling and discomfort in the knee.  He takes ibuprofen.   Review of Systems   Objective: Vital Signs: There were no vitals taken for this visit.  Physical Exam  Ortho Exam Right knee shows a large joint effusion with fully healed surgical scars.  No signs of infection. Specialty Comments:  No specialty comments available.  Imaging: No results found.   PMFS History: Patient Active Problem List   Diagnosis Date Noted  . Effusion, right knee 10/28/2019  . Acute medial meniscal tear, right, initial encounter 09/04/2019   Past Medical History:  Diagnosis Date  . GERD (gastroesophageal reflux disease)   . Hypertension     History reviewed. No pertinent family history.  Past Surgical History:  Procedure Laterality Date  . CARPAL TUNNEL RELEASE Bilateral 1997  . CHONDROPLASTY Right 09/23/2019   Procedure: CHONDROPLASTY;  Surgeon: Leandrew Koyanagi, MD;  Location: [facility];  Service: Orthopedics;  Laterality: Right;  . KNEE ARTHROSCOPY WITH MEDIAL MENISECTOMY Right 09/23/2019   Procedure: RIGHT KNEE ARTHROSCOPY WITH PARTIAL MEDIAL MENISCECTOMY;  Surgeon: Leandrew Koyanagi, MD;  Location: [facility];  Service: Orthopedics;  Laterality: Right;  . KNEE SURGERY Left 1992   Social History   Occupational History  . Not on file  Tobacco Use  . Smoking status: Former Research scientist (life sciences)  . Smokeless tobacco: Never Used  Substance and Sexual Activity  . Alcohol use: Not Currently  . Drug use: Never  . Sexual activity: Not on file

## 2019-11-04 DIAGNOSIS — J301 Allergic rhinitis due to pollen: Secondary | ICD-10-CM | POA: Diagnosis not present

## 2019-11-04 DIAGNOSIS — J3089 Other allergic rhinitis: Secondary | ICD-10-CM | POA: Diagnosis not present

## 2019-11-04 DIAGNOSIS — J3081 Allergic rhinitis due to animal (cat) (dog) hair and dander: Secondary | ICD-10-CM | POA: Diagnosis not present

## 2019-11-24 NOTE — Progress Notes (Signed)
   Covid-19 Vaccination Clinic  Name:  Sly Parlee    MRN: [id number] DOB: 1951/05/13  11/24/2019  Mr. Cappella was observed post Covid-19 immunization for 15 minutes without incident. He was provided with Vaccine Information Sheet and instruction to access the V-Safe system.   Mr. Xiao was instructed to call 911 with any severe reactions post vaccine: Marland Kitchen Difficulty breathing  . Swelling of face and throat  . A fast heartbeat  . A bad rash all over body  . Dizziness and weakness

## 2019-11-25 DIAGNOSIS — J3081 Allergic rhinitis due to animal (cat) (dog) hair and dander: Secondary | ICD-10-CM | POA: Diagnosis not present

## 2019-11-25 DIAGNOSIS — J3089 Other allergic rhinitis: Secondary | ICD-10-CM | POA: Diagnosis not present

## 2019-11-25 DIAGNOSIS — J301 Allergic rhinitis due to pollen: Secondary | ICD-10-CM | POA: Diagnosis not present

## 2019-12-09 DIAGNOSIS — J301 Allergic rhinitis due to pollen: Secondary | ICD-10-CM | POA: Diagnosis not present

## 2019-12-09 DIAGNOSIS — J3081 Allergic rhinitis due to animal (cat) (dog) hair and dander: Secondary | ICD-10-CM | POA: Diagnosis not present

## 2019-12-09 DIAGNOSIS — J3089 Other allergic rhinitis: Secondary | ICD-10-CM | POA: Diagnosis not present

## 2019-12-17 DIAGNOSIS — Z8249 Family history of ischemic heart disease and other diseases of the circulatory system: Secondary | ICD-10-CM | POA: Diagnosis not present

## 2019-12-17 DIAGNOSIS — K219 Gastro-esophageal reflux disease without esophagitis: Secondary | ICD-10-CM | POA: Diagnosis not present

## 2019-12-17 DIAGNOSIS — Z833 Family history of diabetes mellitus: Secondary | ICD-10-CM | POA: Diagnosis not present

## 2019-12-17 DIAGNOSIS — Z008 Encounter for other general examination: Secondary | ICD-10-CM | POA: Diagnosis not present

## 2019-12-17 DIAGNOSIS — Z87891 Personal history of nicotine dependence: Secondary | ICD-10-CM | POA: Diagnosis not present

## 2019-12-17 DIAGNOSIS — M1990 Unspecified osteoarthritis, unspecified site: Secondary | ICD-10-CM | POA: Diagnosis not present

## 2019-12-17 DIAGNOSIS — I10 Essential (primary) hypertension: Secondary | ICD-10-CM | POA: Diagnosis not present

## 2019-12-21 DIAGNOSIS — J3081 Allergic rhinitis due to animal (cat) (dog) hair and dander: Secondary | ICD-10-CM | POA: Diagnosis not present

## 2019-12-21 DIAGNOSIS — J3089 Other allergic rhinitis: Secondary | ICD-10-CM | POA: Diagnosis not present

## 2019-12-21 DIAGNOSIS — J301 Allergic rhinitis due to pollen: Secondary | ICD-10-CM | POA: Diagnosis not present

## 2019-12-24 ENCOUNTER — Encounter: Payer: Self-pay | Admitting: Orthopaedic Surgery

## 2019-12-24 ENCOUNTER — Ambulatory Visit: Payer: Medicare HMO | Admitting: Orthopaedic Surgery

## 2019-12-24 VITALS — Ht 65.0 in | Wt 175.0 lb

## 2019-12-24 DIAGNOSIS — M25461 Effusion, right knee: Secondary | ICD-10-CM | POA: Diagnosis not present

## 2019-12-24 DIAGNOSIS — S83241A Other tear of medial meniscus, current injury, right knee, initial encounter: Secondary | ICD-10-CM | POA: Diagnosis not present

## 2019-12-24 MED ORDER — METHYLPREDNISOLONE ACETATE 40 MG/ML IJ SUSP
40.0000 mg | INTRAMUSCULAR | Status: AC | PRN
  Administered 2019-12-24: 40 mg via INTRA_ARTICULAR

## 2019-12-24 MED ORDER — LIDOCAINE HCL 1 % IJ SOLN
2.0000 mL | INTRAMUSCULAR | Status: AC | PRN
  Administered 2019-12-24: 2 mL

## 2019-12-24 MED ORDER — BUPIVACAINE HCL 0.5 % IJ SOLN
2.0000 mL | INTRAMUSCULAR | Status: AC | PRN
  Administered 2019-12-24: 2 mL via INTRA_ARTICULAR

## 2019-12-24 NOTE — Addendum Note (Signed)
Addended by: Lendon Collar on: 12/24/2019 09:51 AM   Modules accepted: Orders

## 2019-12-24 NOTE — Progress Notes (Signed)
Office Visit Note   Patient: Juan Garrett           Date of Birth: [birth year]           MRN: [id number] Visit Date: 12/24/2019              Requested by: Wenda Low, MD [address] [place],  [place] [address] PCP: Wenda Low, MD   Assessment & Plan: Visit Diagnoses:  1. Effusion, right knee   2. Acute medial meniscal tear, right, initial encounter     Plan: Impression is recurrent right knee joint effusion.  He did have grade III-IV chondromalacia of the medial compartment and patellofemoral compartment.  The effusion is likely postsurgical inflammatory changes.  35 cc of fluid obtained was sent to the lab.  We repeated the aspiration injection today.  Compression wrap applied.  He will increase activity as tolerated.  Follow-Up Instructions: Return if symptoms worsen or fail to improve.   Orders:  No orders of the defined types were placed in this encounter.  No orders of the defined types were placed in this encounter.     Procedures: Large Joint Inj: R knee on 12/24/2019 9:27 AM Indications: pain Details: 22 G needle  Arthrogram: No  Medications: 40 mg methylPREDNISolone acetate 40 MG/ML; 2 mL lidocaine 1 %; 2 mL bupivacaine 0.5 % Consent was given by the patient. Patient was prepped and draped in the usual sterile fashion.       Clinical Data: No additional findings.   Subjective: Chief Complaint  Patient presents with  . Right Knee - Follow-up    Juan Garrett is a 19-month status post right knee arthroscopy partial medial meniscectomy.  He comes back today for recurrent right knee weakness and effusion.  We performed aspiration injection about 2 months ago which we obtained 35 cc of joint fluid.  He has no trouble standing at work but mainly has trouble walking a golf course.   Review of Systems   Objective: Vital Signs: Ht 5\' 5"  (1.651 m)   Wt 175 lb (79.4 kg)   BMI 29.12 kg/m   Physical Exam  Ortho Exam Right knee  shows a recurrent joint effusion.  No signs of infection.  Surgical scars are fully healed.  No joint line tenderness. Specialty Comments:  No specialty comments available.  Imaging: No results found.   PMFS History: Patient Active Problem List   Diagnosis Date Noted  . Effusion, right knee 10/28/2019  . Acute medial meniscal tear, right, initial encounter 09/04/2019   Past Medical History:  Diagnosis Date  . GERD (gastroesophageal reflux disease)   . Hypertension     History reviewed. No pertinent family history.  Past Surgical History:  Procedure Laterality Date  . CARPAL TUNNEL RELEASE Bilateral 1997  . CHONDROPLASTY Right 09/23/2019   Procedure: CHONDROPLASTY;  Surgeon: Leandrew Koyanagi, MD;  Location: [facility];  Service: Orthopedics;  Laterality: Right;  . KNEE ARTHROSCOPY WITH MEDIAL MENISECTOMY Right 09/23/2019   Procedure: RIGHT KNEE ARTHROSCOPY WITH PARTIAL MEDIAL MENISCECTOMY;  Surgeon: Leandrew Koyanagi, MD;  Location: [facility];  Service: Orthopedics;  Laterality: Right;  . KNEE SURGERY Left 1992   Social History   Occupational History  . Not on file  Tobacco Use  . Smoking status: Former Research scientist (life sciences)  . Smokeless tobacco: Never Used  Substance and Sexual Activity  . Alcohol use: Not Currently  . Drug use: Never  . Sexual activity: Not on  file

## 2019-12-25 LAB — SYNOVIAL CELL COUNT + DIFF, W/ CRYSTALS
Basophils, %: 0 %
Eosinophils-Synovial: 0 % (ref 0–2)
Lymphocytes-Synovial Fld: 51 % (ref 0–74)
Monocyte/Macrophage: 11 % (ref 0–69)
Neutrophil, Synovial: 33 % — ABNORMAL HIGH (ref 0–24)
Synoviocytes, %: 5 % (ref 0–15)
WBC, Synovial: 193 cells/uL — ABNORMAL HIGH (ref <150)

## 2020-01-06 DIAGNOSIS — J3089 Other allergic rhinitis: Secondary | ICD-10-CM | POA: Diagnosis not present

## 2020-01-06 DIAGNOSIS — J3081 Allergic rhinitis due to animal (cat) (dog) hair and dander: Secondary | ICD-10-CM | POA: Diagnosis not present

## 2020-01-06 DIAGNOSIS — J301 Allergic rhinitis due to pollen: Secondary | ICD-10-CM | POA: Diagnosis not present

## 2020-01-27 DIAGNOSIS — J3081 Allergic rhinitis due to animal (cat) (dog) hair and dander: Secondary | ICD-10-CM | POA: Diagnosis not present

## 2020-01-27 DIAGNOSIS — J301 Allergic rhinitis due to pollen: Secondary | ICD-10-CM | POA: Diagnosis not present

## 2020-01-27 DIAGNOSIS — J3089 Other allergic rhinitis: Secondary | ICD-10-CM | POA: Diagnosis not present

## 2020-02-10 DIAGNOSIS — J3089 Other allergic rhinitis: Secondary | ICD-10-CM | POA: Diagnosis not present

## 2020-02-10 DIAGNOSIS — J3081 Allergic rhinitis due to animal (cat) (dog) hair and dander: Secondary | ICD-10-CM | POA: Diagnosis not present

## 2020-02-10 DIAGNOSIS — J301 Allergic rhinitis due to pollen: Secondary | ICD-10-CM | POA: Diagnosis not present

## 2020-02-24 DIAGNOSIS — J3089 Other allergic rhinitis: Secondary | ICD-10-CM | POA: Diagnosis not present

## 2020-02-24 DIAGNOSIS — J3081 Allergic rhinitis due to animal (cat) (dog) hair and dander: Secondary | ICD-10-CM | POA: Diagnosis not present

## 2020-02-24 DIAGNOSIS — J301 Allergic rhinitis due to pollen: Secondary | ICD-10-CM | POA: Diagnosis not present

## 2020-03-04 DIAGNOSIS — J3089 Other allergic rhinitis: Secondary | ICD-10-CM | POA: Diagnosis not present

## 2020-03-04 DIAGNOSIS — J3081 Allergic rhinitis due to animal (cat) (dog) hair and dander: Secondary | ICD-10-CM | POA: Diagnosis not present

## 2020-03-04 DIAGNOSIS — J301 Allergic rhinitis due to pollen: Secondary | ICD-10-CM | POA: Diagnosis not present

## 2020-03-09 DIAGNOSIS — J3089 Other allergic rhinitis: Secondary | ICD-10-CM | POA: Diagnosis not present

## 2020-03-09 DIAGNOSIS — J301 Allergic rhinitis due to pollen: Secondary | ICD-10-CM | POA: Diagnosis not present

## 2020-03-09 DIAGNOSIS — J3081 Allergic rhinitis due to animal (cat) (dog) hair and dander: Secondary | ICD-10-CM | POA: Diagnosis not present

## 2020-03-22 ENCOUNTER — Ambulatory Visit: Payer: Medicare HMO | Admitting: Orthopaedic Surgery

## 2020-03-22 ENCOUNTER — Encounter: Payer: Self-pay | Admitting: Orthopaedic Surgery

## 2020-03-22 ENCOUNTER — Ambulatory Visit: Payer: Self-pay

## 2020-03-22 DIAGNOSIS — M542 Cervicalgia: Secondary | ICD-10-CM | POA: Diagnosis not present

## 2020-03-22 DIAGNOSIS — M7581 Other shoulder lesions, right shoulder: Secondary | ICD-10-CM | POA: Diagnosis not present

## 2020-03-22 MED ORDER — LIDOCAINE HCL 2 % IJ SOLN
2.0000 mL | INTRAMUSCULAR | Status: AC | PRN
  Administered 2020-03-22: 2 mL

## 2020-03-22 MED ORDER — METHYLPREDNISOLONE ACETATE 40 MG/ML IJ SUSP
40.0000 mg | INTRAMUSCULAR | Status: AC | PRN
  Administered 2020-03-22: 40 mg via INTRA_ARTICULAR

## 2020-03-22 MED ORDER — BUPIVACAINE HCL 0.25 % IJ SOLN
2.0000 mL | INTRAMUSCULAR | Status: AC | PRN
  Administered 2020-03-22: 2 mL via INTRA_ARTICULAR

## 2020-03-22 NOTE — Progress Notes (Signed)
Office Visit Note   Patient: Juan Garrett           Date of Birth: Mar 23, 1951           MRN: [id number] Visit Date: 03/22/2020              Requested by: Wenda Low, MD [address] [place],  [place] [address] PCP: Wenda Low, MD   Assessment & Plan: Visit Diagnoses:  1. Rotator cuff tendinitis, right   2. Neck pain     Plan: Impression is right shoulder rotator cuff tendinitis and subacromial bursitis.  We have discussed proceeding with MRI versus cortisone injection followed by MRI if his symptoms do not improve.  He would like to try cortisone injection first.  We will proceed with this today as well as provide him with a gym exercise program.  He will call us if he has not any better over the next several weeks.  Follow-up with [place] as needed otherwise.  Follow-Up Instructions: Return if symptoms worsen or fail to improve.   Orders:  Orders Placed This Encounter  Procedures  . Large Joint Inj: R subacromial bursa  . XR Shoulder Right  . XR Cervical Spine 2 or 3 views   No orders of the defined types were placed in this encounter.     Procedures: Large Joint Inj: R subacromial bursa on 03/22/2020 3:21 PM Indications: pain Details: 22 G needle Medications: 2 mL lidocaine 2 %; 2 mL bupivacaine 0.25 %; 40 mg methylPREDNISolone acetate 40 MG/ML Outcome: tolerated well, no immediate complications Patient was prepped and draped in the usual sterile fashion.       Clinical Data: No additional findings.   Subjective: Chief Complaint  Patient presents with  . Right Shoulder - Pain  . Neck - Pain    HPI patient is a very pleasant 69 year old gentleman who comes in today with 3 to 4 years of right shoulder pain.  Initial injury occurred while he was trying to kill his neck and forcefully hit the ground with the pole.  He was seen by an orthopedist in [place] where he was given a cortisone injection as well as a home exercise program.   His symptoms dramatically improved until about a month ago when he was doing push-ups and a poor form.  He has noticed increased pain to the entire shoulder and into the deltoid.  This pain did seem to improve last week.  The motions that seem to aggravate his symptoms include external rotation of the shoulder.  He has been taking ibuprofen at night with moderate relief of symptoms.  No numbness, tingling or burning.  No noticeable weakness to the right upper extremity.  Review of Systems as detailed in HPI.  All others reviewed and are negative.   Objective: Vital Signs: There were no vitals taken for this visit.  Physical Exam well-developed well-nourished gentleman in no acute distress.  Alert and oriented x3.  Ortho Exam examination of the right shoulder reveals near full active range of motion.  He can internally rotate to his back pocket.  Markedly positive empty can.  No tenderness the AC joint.  Negative cross body adduction.  Positive belly press.  He is neurovascular intact distally.  Specialty Comments:  No specialty comments available.  Imaging: XR Cervical Spine 2 or 3 views  Result Date: 03/22/2020 X-rays demonstrate marked spondylosis worse C3-4, C4-5 and C5-6  XR Shoulder Right  Result Date: 03/22/2020 Mild degenerative changes  to the [facility] joint.  No superior migration of the humeral head.  Otherwise, no acute findings    PMFS History: Patient Active Problem List   Diagnosis Date Noted  . Effusion, right knee 10/28/2019  . Acute medial meniscal tear, right, initial encounter 09/04/2019   Past Medical History:  Diagnosis Date  . GERD (gastroesophageal reflux disease)   . Hypertension     History reviewed. No pertinent family history.  Past Surgical History:  Procedure Laterality Date  . CARPAL TUNNEL RELEASE Bilateral 1997  . CHONDROPLASTY Right 09/23/2019   Procedure: CHONDROPLASTY;  Surgeon: Leandrew Koyanagi, MD;  Location: [facility];  Service:  Orthopedics;  Laterality: Right;  . KNEE ARTHROSCOPY WITH MEDIAL MENISECTOMY Right 09/23/2019   Procedure: RIGHT KNEE ARTHROSCOPY WITH PARTIAL MEDIAL MENISCECTOMY;  Surgeon: Leandrew Koyanagi, MD;  Location: [facility];  Service: Orthopedics;  Laterality: Right;  . KNEE SURGERY Left 1992   Social History   Occupational History  . Not on file  Tobacco Use  . Smoking status: Former Research scientist (life sciences)  . Smokeless tobacco: Never Used  Substance and Sexual Activity  . Alcohol use: Not Currently  . Drug use: Never  . Sexual activity: Not on file

## 2020-03-23 DIAGNOSIS — J301 Allergic rhinitis due to pollen: Secondary | ICD-10-CM | POA: Diagnosis not present

## 2020-03-23 DIAGNOSIS — J3081 Allergic rhinitis due to animal (cat) (dog) hair and dander: Secondary | ICD-10-CM | POA: Diagnosis not present

## 2020-03-23 DIAGNOSIS — J3089 Other allergic rhinitis: Secondary | ICD-10-CM | POA: Diagnosis not present

## 2020-04-05 DIAGNOSIS — E559 Vitamin D deficiency, unspecified: Secondary | ICD-10-CM | POA: Diagnosis not present

## 2020-04-05 DIAGNOSIS — I10 Essential (primary) hypertension: Secondary | ICD-10-CM | POA: Diagnosis not present

## 2020-04-05 DIAGNOSIS — J3089 Other allergic rhinitis: Secondary | ICD-10-CM | POA: Diagnosis not present

## 2020-04-05 DIAGNOSIS — J301 Allergic rhinitis due to pollen: Secondary | ICD-10-CM | POA: Diagnosis not present

## 2020-04-05 DIAGNOSIS — Z23 Encounter for immunization: Secondary | ICD-10-CM | POA: Diagnosis not present

## 2020-04-05 DIAGNOSIS — J3081 Allergic rhinitis due to animal (cat) (dog) hair and dander: Secondary | ICD-10-CM | POA: Diagnosis not present

## 2020-04-05 DIAGNOSIS — Z0000 Encounter for general adult medical examination without abnormal findings: Secondary | ICD-10-CM | POA: Diagnosis not present

## 2020-04-05 DIAGNOSIS — J309 Allergic rhinitis, unspecified: Secondary | ICD-10-CM | POA: Diagnosis not present

## 2020-04-05 DIAGNOSIS — K219 Gastro-esophageal reflux disease without esophagitis: Secondary | ICD-10-CM | POA: Diagnosis not present

## 2020-04-05 DIAGNOSIS — E785 Hyperlipidemia, unspecified: Secondary | ICD-10-CM | POA: Diagnosis not present

## 2020-04-05 DIAGNOSIS — Z1389 Encounter for screening for other disorder: Secondary | ICD-10-CM | POA: Diagnosis not present

## 2020-04-05 DIAGNOSIS — Z125 Encounter for screening for malignant neoplasm of prostate: Secondary | ICD-10-CM | POA: Diagnosis not present

## 2020-04-15 DIAGNOSIS — L814 Other melanin hyperpigmentation: Secondary | ICD-10-CM | POA: Diagnosis not present

## 2020-04-15 DIAGNOSIS — D1801 Hemangioma of skin and subcutaneous tissue: Secondary | ICD-10-CM | POA: Diagnosis not present

## 2020-04-15 DIAGNOSIS — L821 Other seborrheic keratosis: Secondary | ICD-10-CM | POA: Diagnosis not present

## 2020-04-15 DIAGNOSIS — L820 Inflamed seborrheic keratosis: Secondary | ICD-10-CM | POA: Diagnosis not present

## 2020-04-15 DIAGNOSIS — D225 Melanocytic nevi of trunk: Secondary | ICD-10-CM | POA: Diagnosis not present

## 2020-04-20 DIAGNOSIS — J3081 Allergic rhinitis due to animal (cat) (dog) hair and dander: Secondary | ICD-10-CM | POA: Diagnosis not present

## 2020-04-20 DIAGNOSIS — J301 Allergic rhinitis due to pollen: Secondary | ICD-10-CM | POA: Diagnosis not present

## 2020-04-20 DIAGNOSIS — J3089 Other allergic rhinitis: Secondary | ICD-10-CM | POA: Diagnosis not present

## 2020-05-11 DIAGNOSIS — J3081 Allergic rhinitis due to animal (cat) (dog) hair and dander: Secondary | ICD-10-CM | POA: Diagnosis not present

## 2020-05-11 DIAGNOSIS — J3089 Other allergic rhinitis: Secondary | ICD-10-CM | POA: Diagnosis not present

## 2020-05-11 DIAGNOSIS — J301 Allergic rhinitis due to pollen: Secondary | ICD-10-CM | POA: Diagnosis not present

## 2020-05-24 ENCOUNTER — Encounter: Payer: Self-pay | Admitting: Gastroenterology

## 2020-05-25 DIAGNOSIS — J3089 Other allergic rhinitis: Secondary | ICD-10-CM | POA: Diagnosis not present

## 2020-05-25 DIAGNOSIS — J301 Allergic rhinitis due to pollen: Secondary | ICD-10-CM | POA: Diagnosis not present

## 2020-05-25 DIAGNOSIS — J3081 Allergic rhinitis due to animal (cat) (dog) hair and dander: Secondary | ICD-10-CM | POA: Diagnosis not present

## 2020-06-08 DIAGNOSIS — J3081 Allergic rhinitis due to animal (cat) (dog) hair and dander: Secondary | ICD-10-CM | POA: Diagnosis not present

## 2020-06-08 DIAGNOSIS — J301 Allergic rhinitis due to pollen: Secondary | ICD-10-CM | POA: Diagnosis not present

## 2020-06-08 DIAGNOSIS — J3089 Other allergic rhinitis: Secondary | ICD-10-CM | POA: Diagnosis not present

## 2020-07-06 ENCOUNTER — Ambulatory Visit (AMBULATORY_SURGERY_CENTER): Payer: Self-pay

## 2020-07-06 ENCOUNTER — Other Ambulatory Visit: Payer: Self-pay

## 2020-07-06 ENCOUNTER — Encounter: Payer: Self-pay | Admitting: Gastroenterology

## 2020-07-06 VITALS — Ht 65.0 in | Wt 180.0 lb

## 2020-07-06 DIAGNOSIS — Z1211 Encounter for screening for malignant neoplasm of colon: Secondary | ICD-10-CM

## 2020-07-06 MED ORDER — SUTAB 1479-225-188 MG PO TABS: 1.0000 | ORAL_TABLET | ORAL | 0 refills | Status: DC

## 2020-07-06 NOTE — Progress Notes (Signed)
No egg or soy allergy known to patient  No issues with past sedation with any surgeries or procedures No intubation problems in the past  No FH of Malignant Hyperthermia No diet pills per patient No home 02 use per patient  No blood thinners per patient  Pt denies issues with constipation  No A fib or A flutter  EMMI video via MyChart  COVID 19 guidelines implemented in [facility] today with Pt and RN  Pt is fully vaccinated  for Covid x 2;  Coupon given to pt in [facility] today , Code to Pharmacy  Due to the COVID-19 pandemic we are asking patients to follow certain guidelines.  Pt aware of COVID protocols and [facility] guidelines   

## 2020-07-11 DIAGNOSIS — J301 Allergic rhinitis due to pollen: Secondary | ICD-10-CM | POA: Diagnosis not present

## 2020-07-11 DIAGNOSIS — J3089 Other allergic rhinitis: Secondary | ICD-10-CM | POA: Diagnosis not present

## 2020-07-11 DIAGNOSIS — J3081 Allergic rhinitis due to animal (cat) (dog) hair and dander: Secondary | ICD-10-CM | POA: Diagnosis not present

## 2020-07-15 ENCOUNTER — Encounter: Payer: Self-pay | Admitting: Internal Medicine

## 2020-07-15 ENCOUNTER — Ambulatory Visit (AMBULATORY_SURGERY_CENTER): Payer: Medicare HMO | Admitting: Internal Medicine

## 2020-07-15 ENCOUNTER — Other Ambulatory Visit: Payer: Self-pay

## 2020-07-15 VITALS — BP 107/62 | HR 62 | Temp 97.8°F | Resp 15 | Ht 65.0 in | Wt 180.0 lb

## 2020-07-15 DIAGNOSIS — D125 Benign neoplasm of sigmoid colon: Secondary | ICD-10-CM | POA: Diagnosis not present

## 2020-07-15 DIAGNOSIS — D123 Benign neoplasm of transverse colon: Secondary | ICD-10-CM

## 2020-07-15 DIAGNOSIS — D124 Benign neoplasm of descending colon: Secondary | ICD-10-CM

## 2020-07-15 DIAGNOSIS — Z1211 Encounter for screening for malignant neoplasm of colon: Secondary | ICD-10-CM | POA: Diagnosis not present

## 2020-07-15 DIAGNOSIS — J3081 Allergic rhinitis due to animal (cat) (dog) hair and dander: Secondary | ICD-10-CM | POA: Diagnosis not present

## 2020-07-15 DIAGNOSIS — D120 Benign neoplasm of cecum: Secondary | ICD-10-CM

## 2020-07-15 DIAGNOSIS — J301 Allergic rhinitis due to pollen: Secondary | ICD-10-CM | POA: Diagnosis not present

## 2020-07-15 DIAGNOSIS — J3089 Other allergic rhinitis: Secondary | ICD-10-CM | POA: Diagnosis not present

## 2020-07-15 MED ORDER — SODIUM CHLORIDE 0.9 % IV SOLN: 500.0000 mL | Freq: Once | INTRAVENOUS | Status: DC

## 2020-07-15 NOTE — Op Note (Signed)
[facility] Patient Name: Juan Garrett Procedure Date: 07/15/2020 10:03 AM MRN: [id number] Endoscopist: Jerene Bears , MD Age: 70 Referring MD:  Date of Birth: [birth year] Gender: Male Account #: [id number] Procedure:                Colonoscopy Indications:              Screening for colorectal malignant neoplasm, Last                            colonoscopy 10 years ago Medicines:                Monitored Anesthesia Care Procedure:                Pre-Anesthesia Assessment:                           - Prior to the procedure, a History and Physical                            was performed, and patient medications and                            allergies were reviewed. The patient's tolerance of                            previous anesthesia was also reviewed. The risks                            and benefits of the procedure and the sedation                            options and risks were discussed with the patient.                            All questions were answered, and informed consent                            was obtained. Prior Anticoagulants: The patient has                            taken no previous anticoagulant or antiplatelet                            agents. ASA Grade Assessment: II - A patient with                            mild systemic disease. After reviewing the risks                            and benefits, the patient was deemed in                            satisfactory condition to undergo the procedure.  After obtaining informed consent, the colonoscope                            was passed under direct vision. Throughout the                            procedure, the patient's blood pressure, pulse, and                            oxygen saturations were monitored continuously. The                            Olympus CF-HQ190 [id number]) Colonoscope was                            introduced through the anus and advanced to  the                            cecum, identified by palpation. The colonoscopy was                            performed without difficulty. The patient tolerated                            the procedure well. The quality of the bowel                            preparation was excellent. The ileocecal valve,                            appendiceal orifice, and rectum were photographed. Scope In: 10:32:11 AM Scope Out: 10:51:17 AM Scope Withdrawal Time: 0 hours 15 minutes 53 seconds  Total Procedure Duration: 0 hours 19 minutes 6 seconds  Findings:                 The digital rectal exam was normal.                           A 3 mm polyp was found in the cecum. The polyp was                            sessile. The polyp was removed with a cold snare.                            Resection and retrieval were complete.                           Two sessile polyps were found in the transverse                            colon. The polyps were 3 to 5 mm in size. These                            polyps  were removed with a cold snare. Resection                            and retrieval were complete.                           Two sessile polyps were found in the descending                            colon. The polyps were 4 to 5 mm in size. These                            polyps were removed with a cold snare. Resection                            and retrieval were complete.                           A 5 mm polyp was found in the sigmoid colon. The                            polyp was sessile. The polyp was removed with a                            cold snare. Resection and retrieval were complete.                           Many small and large-mouthed diverticula were found                            in the sigmoid colon and descending colon.                           The retroflexed view of the distal rectum and anal                            verge was normal and showed no anal or rectal                             abnormalities. Complications:            No immediate complications. Estimated Blood Loss:     Estimated blood loss was minimal. Impression:               - One 3 mm polyp in the cecum, removed with a cold                            snare. Resected and retrieved.                           - Two 3 to 5 mm polyps in the transverse colon,                            removed  with a cold snare. Resected and retrieved.                           - Two 4 to 5 mm polyps in the descending colon,                            removed with a cold snare. Resected and retrieved.                           - One 5 mm polyp in the sigmoid colon, removed with                            a cold snare. Resected and retrieved.                           - Severe diverticulosis in the sigmoid colon and in                            the descending colon.                           - The distal rectum and anal verge are normal on                            retroflexion view. Recommendation:           - Patient has a contact number available for                            emergencies. The signs and symptoms of potential                            delayed complications were discussed with the                            patient. Return to normal activities tomorrow.                            Written discharge instructions were provided to the                            patient.                           - Resume previous diet.                           - Continue present medications.                           - Await pathology results.                           - Repeat colonoscopy is recommended. The  colonoscopy date will be determined after pathology                            results from today's exam become available for                            review. Jerene Bears, MD 07/15/2020 10:54:44 AM This report has been signed electronically.

## 2020-07-15 NOTE — Progress Notes (Signed)
Called to room to assist during endoscopic procedure.  Patient ID and intended procedure confirmed with present staff. Received instructions for my participation in the procedure from the performing physician.  

## 2020-07-15 NOTE — Progress Notes (Signed)
Medical history reviewed with no changes noted. VS assessed by C.W 

## 2020-07-15 NOTE — Patient Instructions (Signed)
Handouts given for polyps and diverticulosis.  Await pathology results.  YOU HAD AN ENDOSCOPIC PROCEDURE TODAY AT THE [facility]:   Refer to the procedure report that was given to you for any specific questions about what was found during the examination.  If the procedure report does not answer your questions, please call your gastroenterologist to clarify.  If you requested that your care partner not be given the details of your procedure findings, then the procedure report has been included in a sealed envelope for you to review at your convenience later.  YOU SHOULD EXPECT: Some feelings of bloating in the abdomen. Passage of more gas than usual.  Walking can help get rid of the air that was put into your GI tract during the procedure and reduce the bloating. If you had a lower endoscopy (such as a colonoscopy or flexible sigmoidoscopy) you may notice spotting of blood in your stool or on the toilet paper. If you underwent a bowel prep for your procedure, you may not have a normal bowel movement for a few days.  Please Note:  You might notice some irritation and congestion in your nose or some drainage.  This is from the oxygen used during your procedure.  There is no need for concern and it should clear up in a day or so.  SYMPTOMS TO REPORT IMMEDIATELY:   Following lower endoscopy (colonoscopy or flexible sigmoidoscopy):  Excessive amounts of blood in the stool  Significant tenderness or worsening of abdominal pains  Swelling of the abdomen that is new, acute  Fever of 100F or higher  For urgent or emergent issues, a gastroenterologist can be reached at any hour by calling [phone]. Do not use MyChart messaging for urgent concerns.    DIET:  We do recommend a small meal at first, but then you may proceed to your regular diet.  Drink plenty of fluids but you should avoid alcoholic beverages for 24 hours.  ACTIVITY:  You should plan to take it easy for the rest of  today and you should NOT DRIVE or use heavy machinery until tomorrow (because of the sedation medicines used during the test).    FOLLOW UP: Our staff will call the number listed on your records 48-72 hours following your procedure to check on you and address any questions or concerns that you may have regarding the information given to you following your procedure. If we do not reach you, we will leave a message.  We will attempt to reach you two times.  During this call, we will ask if you have developed any symptoms of COVID 19. If you develop any symptoms (ie: fever, flu-like symptoms, shortness of breath, cough etc.) before then, please call [phone].  If you test positive for Covid 19 in the 2 weeks post procedure, please call and report this information to us.    If any biopsies were taken you will be contacted by phone or by letter within the next 1-3 weeks.  Please call us at [phone] if you have not heard about the biopsies in 3 weeks.    SIGNATURES/CONFIDENTIALITY: You and/or your care partner have signed paperwork which will be entered into your electronic medical record.  These signatures attest to the fact that that the information above on your After Visit Summary has been reviewed and is understood.  Full responsibility of the confidentiality of this discharge information lies with you and/or your care-partner. 

## 2020-07-15 NOTE — Progress Notes (Signed)
A and O x3. Report to RN. Tolerated MAC anesthesia well.

## 2020-07-19 ENCOUNTER — Telehealth: Payer: Self-pay | Admitting: *Deleted

## 2020-07-19 NOTE — Telephone Encounter (Signed)
  Follow up Call-  Call back number 07/15/2020  Post procedure Call Back phone  # [phone]  Permission to leave phone message Yes  Some recent data might be hidden     Patient questions:  Do you have a fever, pain , or abdominal swelling? No. Pain Score  0 *  Have you tolerated food without any problems? Yes.    Have you been able to return to your normal activities? Yes.    Do you have any questions about your discharge instructions: Diet   No. Medications  No. Follow up visit  No.  Do you have questions or concerns about your Care? No.  Actions: * If pain score is 4 or above: No action needed, pain <4.  1. Have you developed a fever since your procedure? no  2.   Have you had an respiratory symptoms (SOB or cough) since your procedure? no  3.   Have you tested positive for COVID 19 since your procedure no  4.   Have you had any family members/close contacts diagnosed with the COVID 19 since your procedure?  no   If yes to any of these questions please route to Joylene John, RN and Joella Prince, RN

## 2020-07-22 ENCOUNTER — Encounter: Payer: Self-pay | Admitting: Internal Medicine

## 2020-08-02 DIAGNOSIS — J3089 Other allergic rhinitis: Secondary | ICD-10-CM | POA: Diagnosis not present

## 2020-08-02 DIAGNOSIS — J3081 Allergic rhinitis due to animal (cat) (dog) hair and dander: Secondary | ICD-10-CM | POA: Diagnosis not present

## 2020-08-02 DIAGNOSIS — J301 Allergic rhinitis due to pollen: Secondary | ICD-10-CM | POA: Diagnosis not present

## 2020-08-18 DIAGNOSIS — H16223 Keratoconjunctivitis sicca, not specified as Sjogren's, bilateral: Secondary | ICD-10-CM | POA: Diagnosis not present

## 2020-08-18 DIAGNOSIS — H2513 Age-related nuclear cataract, bilateral: Secondary | ICD-10-CM | POA: Diagnosis not present

## 2020-08-29 DIAGNOSIS — J3081 Allergic rhinitis due to animal (cat) (dog) hair and dander: Secondary | ICD-10-CM | POA: Diagnosis not present

## 2020-08-29 DIAGNOSIS — J301 Allergic rhinitis due to pollen: Secondary | ICD-10-CM | POA: Diagnosis not present

## 2020-08-29 DIAGNOSIS — J3089 Other allergic rhinitis: Secondary | ICD-10-CM | POA: Diagnosis not present

## 2020-09-29 DIAGNOSIS — J301 Allergic rhinitis due to pollen: Secondary | ICD-10-CM | POA: Diagnosis not present

## 2020-09-29 DIAGNOSIS — J3089 Other allergic rhinitis: Secondary | ICD-10-CM | POA: Diagnosis not present

## 2020-09-29 DIAGNOSIS — J3081 Allergic rhinitis due to animal (cat) (dog) hair and dander: Secondary | ICD-10-CM | POA: Diagnosis not present

## 2020-10-27 DIAGNOSIS — J3081 Allergic rhinitis due to animal (cat) (dog) hair and dander: Secondary | ICD-10-CM | POA: Diagnosis not present

## 2020-10-27 DIAGNOSIS — J3089 Other allergic rhinitis: Secondary | ICD-10-CM | POA: Diagnosis not present

## 2020-10-27 DIAGNOSIS — J301 Allergic rhinitis due to pollen: Secondary | ICD-10-CM | POA: Diagnosis not present

## 2021-01-09 ENCOUNTER — Telehealth: Payer: Self-pay | Admitting: Orthopaedic Surgery

## 2021-01-09 NOTE — Telephone Encounter (Signed)
Yes of course.  Thanks for asking.

## 2021-01-09 NOTE — Telephone Encounter (Signed)
Pt called requesting a call back from Dr. Erlinda Hong or Kathlee Nations. Pt wanting to see Dr. Sharol Given about his feet and asking permission from Dr. Erlinda Hong. Pt phone number is [phone].

## 2021-01-20 ENCOUNTER — Ambulatory Visit: Payer: Medicare HMO | Admitting: Physician Assistant

## 2021-01-20 ENCOUNTER — Ambulatory Visit (INDEPENDENT_AMBULATORY_CARE_PROVIDER_SITE_OTHER): Payer: Medicare HMO

## 2021-01-20 ENCOUNTER — Encounter: Payer: Self-pay | Admitting: Physician Assistant

## 2021-01-20 DIAGNOSIS — M25571 Pain in right ankle and joints of right foot: Secondary | ICD-10-CM

## 2021-01-20 DIAGNOSIS — M25572 Pain in left ankle and joints of left foot: Secondary | ICD-10-CM

## 2021-01-20 NOTE — Progress Notes (Signed)
Office Visit Note   Patient: Juan Garrett           Date of Birth: 08/22/1950           MRN: [id number] Visit Date: 01/20/2021              Requested by: Wenda Low, MD [address] [place],  [place] [address] PCP: Wenda Low, MD  No chief complaint on file.     HPI: Patient is a pleasant 70 year old gentleman with a chief complaint of right forefoot pain and occasional left ankle pain.  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  By the end of the day he has aching beneath his metatarsal heads mostly focused beneath the second metatarsal head.  He has not really tried any specific treatment for this but he does wear supportive shoes.  Denies any other foot pain.  Denies any previous injuries On the left ankle he said he occasionally gets a sharp pain in the anterior aspect of his left ankle.  It is usually brief but can make it feel like his ankle is going to give out.  He does report playing sports when he was younger and probably having multiple sprains  Assessment & Plan: Visit Diagnoses:  1. Pain in right ankle and joints of right foot   2. Pain in left ankle and joints of left foot     Plan: With regards to the right foot he is going to begin Achilles stretching and I have given him some metatarsal pads to try.  Left ankle findings most consistent with soft tissue impingement and if this is a consistent problem I would recommend an MRI he could also try an injection    Follow-Up Instructions: No follow-ups on file.   Ortho Exam  Patient is alert, oriented, no adenopathy, well-dressed, normal affect, normal respiratory effort. He has bilateral palpable pulses.  No swelling no cellulitis in either foot or ankle.  On the right side he has no tenderness in the midfoot good strength with dorsiflexion plantarflexion eversion and inversion he is mostly tender beneath the second metatarsal head and slightly beneath the  sesamoid.  He is somewhat tight in his Achilles on the right.  On the left cannot reproduce his painful symptoms today with the exception of palpation anteriorly in the ankle joint.  Imaging: XR Ankle Complete Left  Result Date: 01/20/2021 3 views of his ankle today demonstrate some degenerative changes.  He does have slight varus on x-ray.  No acute osseous changes  XR Foot Complete Right  Result Date: 01/20/2021 3 views of his right foot today demonstrate well-maintained alignment no acute osseous injury he has a bipartite fibular sesamoid versus sesamoid fracture nonacute.  Does have a long second metatarsal  No images are attached to the encounter.  Labs: No results found for: HGBA1C, ESRSEDRATE, CRP, LABURIC, REPTSTATUS, GRAMSTAIN, CULT, LABORGA   No results found for: ALBUMIN, PREALBUMIN, CBC  No results found for: MG No results found for: VD25OH  No results found for: PREALBUMIN No flowsheet data found.   There is no height or weight on file to calculate BMI.  Orders:  Orders Placed This Encounter  Procedures   XR Foot Complete Right   XR Ankle Complete Left   No orders of the defined types were placed in this encounter.    Procedures: No procedures performed  Clinical Data: No additional findings.  ROS:  All  other systems negative, except as noted in the HPI. Review of Systems  Objective: Vital Signs: There were no vitals taken for this visit.  Specialty Comments:  No specialty comments available.  PMFS History: Patient Active Problem List   Diagnosis Date Noted   Effusion, right knee 10/28/2019   Acute medial meniscal tear, right, initial encounter 09/04/2019   Past Medical History:  Diagnosis Date   Allergy    seasonal allergies   Arthritis    bilateral knees   Cataract    not a surgical candidate at this time (07/06/2020)   GERD (gastroesophageal reflux disease)    on meds   Hypertension    on meds    Family History  Problem  Relation Age of Onset   Colon polyps Neg Hx    Colon cancer Neg Hx    Esophageal cancer Neg Hx    Stomach cancer Neg Hx    Rectal cancer Neg Hx     Past Surgical History:  Procedure Laterality Date   CARPAL TUNNEL RELEASE Bilateral 1997   CHONDROPLASTY Right 09/23/2019   Procedure: CHONDROPLASTY;  Surgeon: Leandrew Koyanagi, MD;  Location: [place];  Service: Orthopedics;  Laterality: Right;   KNEE ARTHROSCOPY WITH MEDIAL MENISECTOMY Right 09/23/2019   Procedure: RIGHT KNEE ARTHROSCOPY WITH PARTIAL MEDIAL MENISCECTOMY;  Surgeon: Leandrew Koyanagi, MD;  Location: [place];  Service: Orthopedics;  Laterality: Right;   KNEE SURGERY Left 1992   Social History   Occupational History   Not on file  Tobacco Use   Smoking status: Former   Smokeless tobacco: Never  Vaping Use   Vaping Use: Never used  Substance and Sexual Activity   Alcohol use: Not Currently   Drug use: Never   Sexual activity: Not on file

## 2021-02-02 DIAGNOSIS — J301 Allergic rhinitis due to pollen: Secondary | ICD-10-CM | POA: Diagnosis not present

## 2021-02-02 DIAGNOSIS — J3089 Other allergic rhinitis: Secondary | ICD-10-CM | POA: Diagnosis not present

## 2021-02-02 DIAGNOSIS — J3081 Allergic rhinitis due to animal (cat) (dog) hair and dander: Secondary | ICD-10-CM | POA: Diagnosis not present

## 2021-02-07 DIAGNOSIS — J301 Allergic rhinitis due to pollen: Secondary | ICD-10-CM | POA: Diagnosis not present

## 2021-02-07 DIAGNOSIS — J3089 Other allergic rhinitis: Secondary | ICD-10-CM | POA: Diagnosis not present

## 2021-02-07 DIAGNOSIS — J3081 Allergic rhinitis due to animal (cat) (dog) hair and dander: Secondary | ICD-10-CM | POA: Diagnosis not present

## 2021-02-17 ENCOUNTER — Other Ambulatory Visit: Payer: Self-pay

## 2021-02-17 ENCOUNTER — Ambulatory Visit: Payer: Medicare HMO | Admitting: Physician Assistant

## 2021-02-17 DIAGNOSIS — M25571 Pain in right ankle and joints of right foot: Secondary | ICD-10-CM

## 2021-02-17 NOTE — Progress Notes (Signed)
Office Visit Note   Patient: Juan Garrett           Date of Birth: 1950/11/28           MRN: [id number] Visit Date: 02/17/2021              Requested by: Wenda Low, MD [address] [place],  [place] [address] PCP: Wenda Low, MD  Chief Complaint  Patient presents with   Right Foot - Follow-up   Left Foot - Follow-up   Right Ankle - Follow-up   Left Ankle - Follow-up      HPI: Patient presents today for follow-up on his bilateral forefoot pain and numbness.  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  At last visit we reviewed Achilles stretching exercises.  He has says his ankle pain that he previously talked about is not really an issue for him.  But he continues to have pain after working all day on his feet.  He focuses it is a burning pain and stabbing pain after a long day at work with some associated paresthesias.  He is extremely healthy but states that his father was a diabetic and a smoker.  He is not sure if he is ever been tested for diabetes but does have a appointment with his primary care provider next month and will review this with him  Assessment & Plan: Visit Diagnoses: No diagnosis found.  Plan: Patient will follow-up with his primary care provider.  With regards to today I have given him a prescription for physical therapy.  Certainly we could try him on gabapentin but I do think with Achilles contractures this may be more of a mechanical problem not necessarily neuropathy.  We will follow-up in 4 weeks after physical therapy  Follow-Up Instructions: No follow-ups on file.   Ortho Exam  Patient is alert, oriented, no adenopathy, well-dressed, normal affect, normal respiratory effort. Bilateral lower extremities he has no swelling no redness no discoloration.  Easily palpable bilateral dorsalis pedis pulses.  Still quite tight in the Achilles coming only to a neutral position.  No signs of infection.  Imaging: No  results found. No images are attached to the encounter.  Labs: No results found for: HGBA1C, ESRSEDRATE, CRP, LABURIC, REPTSTATUS, GRAMSTAIN, CULT, LABORGA   No results found for: ALBUMIN, PREALBUMIN, CBC  No results found for: MG No results found for: VD25OH  No results found for: PREALBUMIN No flowsheet data found.   There is no height or weight on file to calculate BMI.  Orders:  No orders of the defined types were placed in this encounter.  No orders of the defined types were placed in this encounter.    Procedures: No procedures performed  Clinical Data: No additional findings.  ROS:  All other systems negative, except as noted in the HPI. Review of Systems  Objective: Vital Signs: There were no vitals taken for this visit.  Specialty Comments:  No specialty comments available.  PMFS History: Patient Active Problem List   Diagnosis Date Noted   Effusion, right knee 10/28/2019   Acute medial meniscal tear, right, initial encounter 09/04/2019   Past Medical History:  Diagnosis Date   Allergy    seasonal allergies   Arthritis    bilateral knees   Cataract    not a surgical candidate at this time (07/06/2020)   GERD (gastroesophageal reflux disease)    on meds   Hypertension  on meds    Family History  Problem Relation Age of Onset   Colon polyps Neg Hx    Colon cancer Neg Hx    Esophageal cancer Neg Hx    Stomach cancer Neg Hx    Rectal cancer Neg Hx     Past Surgical History:  Procedure Laterality Date   CARPAL TUNNEL RELEASE Bilateral 1997   CHONDROPLASTY Right 09/23/2019   Procedure: CHONDROPLASTY;  Surgeon: Leandrew Koyanagi, MD;  Location: [facility];  Service: Orthopedics;  Laterality: Right;   KNEE ARTHROSCOPY WITH MEDIAL MENISECTOMY Right 09/23/2019   Procedure: RIGHT KNEE ARTHROSCOPY WITH PARTIAL MEDIAL MENISCECTOMY;  Surgeon: Leandrew Koyanagi, MD;  Location: [place];  Service: Orthopedics;  Laterality:  Right;   KNEE SURGERY Left 1992   Social History   Occupational History   Not on file  Tobacco Use   Smoking status: Former   Smokeless tobacco: Never  Vaping Use   Vaping Use: Never used  Substance and Sexual Activity   Alcohol use: Not Currently   Drug use: Never   Sexual activity: Not on file

## 2021-02-20 DIAGNOSIS — J301 Allergic rhinitis due to pollen: Secondary | ICD-10-CM | POA: Diagnosis not present

## 2021-02-20 DIAGNOSIS — J3081 Allergic rhinitis due to animal (cat) (dog) hair and dander: Secondary | ICD-10-CM | POA: Diagnosis not present

## 2021-02-20 DIAGNOSIS — J3089 Other allergic rhinitis: Secondary | ICD-10-CM | POA: Diagnosis not present

## 2021-02-24 ENCOUNTER — Encounter: Payer: Self-pay | Admitting: Rehabilitative and Restorative Service Providers"

## 2021-02-24 ENCOUNTER — Other Ambulatory Visit: Payer: Self-pay

## 2021-02-24 ENCOUNTER — Ambulatory Visit: Payer: Medicare HMO | Admitting: Rehabilitative and Restorative Service Providers"

## 2021-02-24 DIAGNOSIS — M25572 Pain in left ankle and joints of left foot: Secondary | ICD-10-CM | POA: Diagnosis not present

## 2021-02-24 DIAGNOSIS — M6281 Muscle weakness (generalized): Secondary | ICD-10-CM

## 2021-02-24 DIAGNOSIS — M25571 Pain in right ankle and joints of right foot: Secondary | ICD-10-CM | POA: Diagnosis not present

## 2021-02-24 DIAGNOSIS — R262 Difficulty in walking, not elsewhere classified: Secondary | ICD-10-CM | POA: Diagnosis not present

## 2021-02-24 NOTE — Therapy (Signed)
[facility] Physical Therapy [address] [place], [place], [address] Phone: [phone]   Fax:  [phone]  Physical Therapy Evaluation  Patient Details  Name: Juan Garrett MRN: [id number] Date of Birth: 1950/07/26 Referring Provider (PT): Persons, Bevely Palmer, [place]   Encounter Date: 02/24/2021   PT End of Session - 02/24/21 1055     Visit Number 1    Number of Visits 20    Date for PT Re-Evaluation 05/05/21    Authorization Type [employer] $35    Progress Note Due on Visit 10    PT Start Time 1100    PT Stop Time 1135    PT Time Calculation (min) 35 min    Activity Tolerance Patient tolerated treatment well    Behavior During Therapy [facility] for tasks assessed/performed             Past Medical History:  Diagnosis Date   Allergy    seasonal allergies   Arthritis    bilateral knees   Cataract    not a surgical candidate at this time (07/06/2020)   GERD (gastroesophageal reflux disease)    on meds   Hypertension    on meds    Past Surgical History:  Procedure Laterality Date   CARPAL TUNNEL RELEASE Bilateral 1997   CHONDROPLASTY Right 09/23/2019   Procedure: CHONDROPLASTY;  Surgeon: Leandrew Koyanagi, MD;  Location: [place];  Service: Orthopedics;  Laterality: Right;   KNEE ARTHROSCOPY WITH MEDIAL MENISECTOMY Right 09/23/2019   Procedure: RIGHT KNEE ARTHROSCOPY WITH PARTIAL MEDIAL MENISCECTOMY;  Surgeon: Leandrew Koyanagi, MD;  Location: [place];  Service: Orthopedics;  Laterality: Right;   KNEE SURGERY Left 1992    There were no vitals filed for this visit.    Subjective Assessment - 02/24/21 1100     Subjective Pt. stated onset insidious several months ago for pain in Rt foot c standing, worse in morning.  Pt. stated he also noted burning in two toes.  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  Pt. stated trying  stretching in last 2 weeks that maybe helped.    Limitations Standing;Walking    How long can you stand comfortably? a couple hours    Patient Stated Goals Reduce pain, standing/walking unrestricted    Currently in Pain? Yes    Pain Score 6    at worst   Pain Location Foot    Pain Orientation Left;Right   Rt foot plantar surface, Lt anterior ankle   Pain Descriptors / Indicators Burning;Aching;Sharp;Tightness    Pain Type Chronic pain    Pain Onset More than a month ago    Pain Frequency Intermittent    Aggravating Factors  morning time, standing, walking    Pain Relieving Factors rest off feet                [facility] PT Assessment - 02/24/21 0001       Assessment   Medical Diagnosis M25.571 (ICD-10-CM) - Pain in right ankle and joints of right foot    Referring Provider (PT) Persons, Bevely Palmer, [place]    Onset Date/Surgical Date 12/09/20   a few months onset   Hand Dominance Right      Precautions   Precautions None      Balance Screen   Has the patient fallen in the past 6 months No    Has the patient had a  decrease in activity level because of a fear of falling?  No    Is the patient reluctant to leave their home because of a fear of falling?  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  no pain      Posture/Postural Control   Posture Comments Too many toes bilateral (3 visible in stance bilateral)      ROM / Strength   AROM / PROM / Strength Strength;PROM;AROM      AROM   Overall AROM Comments standing DF in 90 deg knee flexion    AROM Assessment Site Ankle    Right/Left Ankle  Left;Right    Right Ankle Dorsiflexion 2   -25 degrees DF in 0 deg knee extension   Right Ankle Plantar Flexion 60    Right Ankle Inversion 25    Right Ankle Eversion 22    Left Ankle Dorsiflexion 12   -25 DF in 0 deg knee extension   Left Ankle Plantar Flexion 58    Left Ankle Inversion 30    Left Ankle Eversion 20      PROM   PROM Assessment Site Ankle    Right/Left Ankle Left;Right      Strength   Strength Assessment Site Ankle;Knee    Right/Left Knee Right    Right Knee Flexion 5/5    Right Knee Extension 5/5    Right/Left Ankle Left;Right    Right Ankle Dorsiflexion 4/5    Right Ankle Plantar Flexion 4/5   metatarsal area symptoms 12 reps in SL heel raise   Right Ankle Inversion 5/5    Right Ankle Eversion 5/5    Left Ankle Dorsiflexion 5/5    Left Ankle Plantar Flexion 5/5   20 reps in SL heel raise   Left Ankle Inversion 5/5    Left Ankle Eversion 5/5      Palpation   Palpation comment Mild tenderness between 2nd and 3rd metatarsal heads.  AP joint restriction Rt ankle compared to Lt      Ambulation/Gait   Gait Comments Reduced toe off progression, stance on Rt vs. Lt.  calcaneal eversion noted bilateral in stance shoe on and off                        Objective measurements completed on examination: See above findings.       [facility] Adult PT Treatment/Exercise - 02/24/21 0001       Exercises   Exercises Other Exercises;Ankle    Other Exercises  HEP instruction/performance c cues for techniques, handout provided.  Trial set performed of each for comprehension and symptom assessment.  HEP consisting of gastroc stretch standing, soleus stretch standing, DF on step mobility, SLS      Manual Therapy   Manual therapy comments Mobilization c movement posterior talocrural glide c DF on step bilateral                     PT Education -  02/24/21 1055     Education Details HEP, POC    Person(s) Educated Patient    Methods  Explanation;Demonstration;Verbal cues;Handout    Comprehension Returned demonstration;Verbalized understanding              PT Short Term Goals - 02/24/21 1056       PT SHORT TERM GOAL #1   Title Patient will demonstrate independent use of home exercise program to maintain progress from in clinic treatments.    Time 3    Period Weeks    Status New    Target Date 03/17/21               PT Long Term Goals - 02/24/21 1056       PT LONG TERM GOAL #1   Title Patient will demonstrate/report pain at worst less than or equal to 2/10 to facilitate minimal limitation in daily activity secondary to pain symptoms.    Time 10    Period Weeks    Status New    Target Date 05/05/21      PT LONG TERM GOAL #2   Title Patient will demonstrate independent use of home exercise program to facilitate ability to maintain/progress functional gains from skilled physical therapy services.    Time 10    Period Weeks    Status New    Target Date 05/05/21      PT LONG TERM GOAL #3   Title Pt. will demonstrate FOTO outcome > or = 68% to indicated reduced disability due to condition.    Time 10    Period Weeks    Status New    Target Date 05/05/21      PT LONG TERM GOAL #4   Title Pt. will demonstrate bilateral DF AROM > 10 degrees in 90 deg knee flexion, to neutral in 0 deg knee flexion to facilitate normalized heel to toe gait pattern for unrestricted ambulation/standing.    Time 10    Period Weeks    Status New    Target Date 05/05/21      PT LONG TERM GOAL #5   Title Pt. will demonstrate Rt ankle MMT equal to Lt 5/5 to facilitate usual standing, walking at PLOF.    Time 10    Period Weeks    Status New    Target Date 05/05/21                    Plan - 02/24/21 1057     Clinical Impression Statement Patient is a 70 y.o. who comes to clinic with complaints of bilateral ankle/foot pain with mobility, strength and movement coordination deficits that impair their ability  to perform usual daily and recreational functional activities without increase difficulty/symptoms at this time.  Patient to benefit from skilled PT services to address impairments and limitations to improve to previous level of function without restriction secondary to condition.    Personal Factors and Comorbidities Comorbidity 2    Comorbidities HTN, GERD    Examination-Activity Limitations Stand;Locomotion Level    Examination-Participation Restrictions Occupation;Community Activity;Shop;Interpersonal Relationship    Stability/Clinical Decision Making Stable/Uncomplicated    Clinical Decision Making Low    Rehab Potential Good    PT Frequency Other (comment)   1-2x/week   PT Duration Other (comment)   10 weeks   PT Treatment/Interventions ADLs/Self Care Home Management;Cryotherapy;Electrical Stimulation;Iontophoresis '4mg'$ /ml Dexamethasone;Moist Heat;Balance training;Therapeutic exercise;Therapeutic activities;Functional mobility training;Stair training;DME Instruction;Ultrasound;Neuromuscular re-education;Patient/family education;Spinal Manipulations;Passive range of motion;Dry needling;Joint Manipulations;Taping;Manual techniques    PT  Next Visit Plan Ankle joint mobility gains in manual and ther ex, improve posterior cord mobility    PT Home Exercise Plan [email or web address]    Consulted and Agree with Plan of Care Patient             Patient will benefit from skilled therapeutic intervention in order to improve the following deficits and impairments:  Pain, Hypomobility, Decreased activity tolerance, Decreased strength, Decreased mobility, Difficulty walking, Decreased balance, Decreased range of motion, Impaired perceived functional ability, Improper body mechanics, Impaired flexibility, Decreased coordination  Visit Diagnosis: Pain in right ankle and joints of right foot  Pain in left ankle and joints of left foot  Muscle weakness (generalized)  Difficulty in walking, not elsewhere  classified     Problem List Patient Active Problem List   Diagnosis Date Noted   Effusion, right knee 10/28/2019   Acute medial meniscal tear, right, initial encounter 09/04/2019   Scot Jun, PT, DPT, OCS, ATC 02/24/21  11:40 AM    [facility] Physical Therapy [address] [place], [place], [address] Phone: [phone]   Fax:  [phone]  Name: Juan Garrett MRN: [id number] Date of Birth: 1951-03-14

## 2021-02-24 NOTE — Patient Instructions (Signed)
Access Code: [email or web address] URL: [email or web address] Date: 02/24/2021 Prepared by: Scot Jun  Exercises Gastroc Stretch on Wall - 2 x daily - 7 x weekly - 1 sets - 5 reps - 30 hold Standing Gastroc Stretch on Step - 2 x daily - 7 x weekly - 1 sets - 5 reps - 30 sec hold Soleus Stretch on Wall - 2 x daily - 7 x weekly - 1 sets - 5 reps - 30 seconds hold Standing Dorsiflexion Self-Mobilization on Step - 2 x daily - 7 x weekly - 3 sets - 10 reps - 2-3 hold Single Leg Stance - 1 x daily - 7 x weekly - 1 sets - 5 reps - 30 hold

## 2021-03-01 ENCOUNTER — Encounter: Payer: Self-pay | Admitting: Rehabilitative and Restorative Service Providers"

## 2021-03-01 ENCOUNTER — Ambulatory Visit: Payer: Medicare HMO | Admitting: Rehabilitative and Restorative Service Providers"

## 2021-03-01 ENCOUNTER — Other Ambulatory Visit: Payer: Self-pay

## 2021-03-01 DIAGNOSIS — M25571 Pain in right ankle and joints of right foot: Secondary | ICD-10-CM | POA: Diagnosis not present

## 2021-03-01 DIAGNOSIS — R262 Difficulty in walking, not elsewhere classified: Secondary | ICD-10-CM

## 2021-03-01 DIAGNOSIS — M6281 Muscle weakness (generalized): Secondary | ICD-10-CM

## 2021-03-01 DIAGNOSIS — M25572 Pain in left ankle and joints of left foot: Secondary | ICD-10-CM | POA: Diagnosis not present

## 2021-03-01 NOTE — Patient Instructions (Signed)
Access Code: [email or web address] URL: [email or web address] Date: 03/01/2021 Prepared by: Scot Jun  Exercises Gastroc Stretch on Wall - 2 x daily - 7 x weekly - 1 sets - 5 reps - 30 hold Standing Gastroc Stretch on Step - 2 x daily - 7 x weekly - 1 sets - 5 reps - 30 sec hold Soleus Stretch on Wall - 2 x daily - 7 x weekly - 1 sets - 5 reps - 30 seconds hold Standing Dorsiflexion Self-Mobilization on Step - 2 x daily - 7 x weekly - 3 sets - 10 reps - 2-3 hold Single Leg Stance - 1 x daily - 7 x weekly - 1 sets - 5 reps - 30 hold Long Sitting Ankle Dorsiflexion with Anchored Resistance - 2 x daily - 7 x weekly - 10 reps - 3 sets Long Sitting Ankle Plantar Flexion with Resistance - 2 x daily - 7 x weekly - 10 reps - 3 sets Long Sitting Ankle Inversion with Resistance - 2 x daily - 7 x weekly - 10 reps - 3 sets Long Sitting Ankle Eversion with Resistance - 2 x daily - 7 x weekly - 10 reps - 3 sets

## 2021-03-01 NOTE — Therapy (Signed)
[facility] [address] [place], [place], [address] Phone: [phone]   Fax:  [phone]  Physical Therapy Treatment  Patient Details  Name: Juan Garrett MRN: [id number] Date of Birth: 01/05/1951 Referring Provider (PT): Persons, Bevely Palmer, [place]   Encounter Date: 03/01/2021   PT End of Session - 03/01/21 1427     Visit Number 2    Number of Visits 20    Date for PT Re-Evaluation 05/05/21    Authorization Type [employer] Medicare $35    Progress Note Due on Visit 10    PT Start Time 1427    PT Stop Time 1506    PT Time Calculation (min) 39 min    Activity Tolerance Patient tolerated treatment well    Behavior During Therapy [facility] for tasks assessed/performed             Past Medical History:  Diagnosis Date   Allergy    seasonal allergies   Arthritis    bilateral knees   Cataract    not a surgical candidate at this time (07/06/2020)   GERD (gastroesophageal reflux disease)    on meds   Hypertension    on meds    Past Surgical History:  Procedure Laterality Date   CARPAL TUNNEL RELEASE Bilateral 1997   CHONDROPLASTY Right 09/23/2019   Procedure: CHONDROPLASTY;  Surgeon: Leandrew Koyanagi, MD;  Location: [place];  Service: Orthopedics;  Laterality: Right;   KNEE ARTHROSCOPY WITH MEDIAL MENISECTOMY Right 09/23/2019   Procedure: RIGHT KNEE ARTHROSCOPY WITH PARTIAL MEDIAL MENISCECTOMY;  Surgeon: Leandrew Koyanagi, MD;  Location: [place];  Service: Orthopedics;  Laterality: Right;   KNEE SURGERY Left 1992    There were no vitals filed for this visit.   Subjective Assessment - 03/01/21 1448     Subjective Pt. indicated he felt some improvement a day or so after starting exercises but noted continued complaints overall.  Indicated anterior Lt ankle complaints after releasing soleus stretch.    Limitations Standing;Walking    How long can you stand comfortably? a couple hours    Patient Stated Goals  Reduce pain, standing/walking unrestricted    Currently in Pain? Yes    Pain Score 4     Pain Location Foot    Pain Orientation Left;Right    Pain Descriptors / Indicators Aching;Burning;Sharp;Tightness    Pain Type Chronic pain    Pain Onset More than a month ago    Pain Frequency Intermittent    Aggravating Factors  golf, releasing Lt soleus stretch, walking/standing    Pain Relieving Factors stretching helped some                               [facility] Adult PT Treatment/Exercise - 03/01/21 0001       Neuro Re-ed    Neuro Re-ed Details  SLS 30 sec x 4 bilateral (occasional finger tip assist)      Manual Therapy   Manual therapy comments Mobilization c movement posterior talocrural glide c DF on step bilateral      Ankle Exercises: Stretches   Other Stretch incline board gastroc stretch 60 sec x 3, soleus stretch 30 sec x 3 bilateral      Ankle Exercises: Seated   Other Seated Ankle Exercises seated blue band tband ankle 4 way 20x each bilateral slow control focus  PT Education - 03/01/21 1453     Education Details exercise progression    Person(s) Educated Patient    Methods Explanation;Demonstration;Verbal cues;Handout    Comprehension Verbalized understanding;Returned demonstration              PT Short Term Goals - 03/01/21 1453       PT SHORT TERM GOAL #1   Title Patient will demonstrate independent use of home exercise program to maintain progress from in clinic treatments.    Time 3    Period Weeks    Status On-going    Target Date 03/17/21               PT Long Term Goals - 02/24/21 1056       PT LONG TERM GOAL #1   Title Patient will demonstrate/report pain at worst less than or equal to 2/10 to facilitate minimal limitation in daily activity secondary to pain symptoms.    Time 10    Period Weeks    Status New    Target Date 05/05/21      PT LONG TERM GOAL #2   Title Patient will  demonstrate independent use of home exercise program to facilitate ability to maintain/progress functional gains from skilled physical therapy services.    Time 10    Period Weeks    Status New    Target Date 05/05/21      PT LONG TERM GOAL #3   Title Pt. will demonstrate FOTO outcome > or = 68% to indicated reduced disability due to condition.    Time 10    Period Weeks    Status New    Target Date 05/05/21      PT LONG TERM GOAL #4   Title Pt. will demonstrate bilateral DF AROM > 10 degrees in 90 deg knee flexion, to neutral in 0 deg knee flexion to facilitate normalized heel to toe gait pattern for unrestricted ambulation/standing.    Time 10    Period Weeks    Status New    Target Date 05/05/21      PT LONG TERM GOAL #5   Title Pt. will demonstrate Rt ankle MMT equal to Lt 5/5 to facilitate usual standing, walking at PLOF.    Time 10    Period Weeks    Status New    Target Date 05/05/21                   Plan - 03/01/21 1453     Clinical Impression Statement Reviewed HEP c good overall knowledge.  Continued intervention to progress DF mobility bilaterally as well as trying to improve calf mobility.  Continued skilled PT services warranted at this time due to contiued presentation of symptoms and noted impairments.    Personal Factors and Comorbidities Comorbidity 2    Comorbidities HTN, GERD    Examination-Activity Limitations Stand;Locomotion Level    Examination-Participation Restrictions Occupation;Community Activity;Shop;Interpersonal Relationship    Stability/Clinical Decision Making Stable/Uncomplicated    Rehab Potential Good    PT Frequency Other (comment)   1-2x/week   PT Duration Other (comment)   10 weeks   PT Treatment/Interventions ADLs/Self Care Home Management;Cryotherapy;Electrical Stimulation;Iontophoresis 4mg /ml Dexamethasone;Moist Heat;Balance training;Therapeutic exercise;Therapeutic activities;Functional mobility training;Stair training;DME  Instruction;Ultrasound;Neuromuscular re-education;Patient/family education;Spinal Manipulations;Passive range of motion;Dry needling;Joint Manipulations;Taping;Manual techniques    PT Next Visit Plan Continue manual to improve DF mobility, possible inclusion of eccentric PF strengthening.    PT Home Exercise Plan [id number]    Consulted and Agree with  Plan of Care Patient             Patient will benefit from skilled therapeutic intervention in order to improve the following deficits and impairments:  Pain, Hypomobility, Decreased activity tolerance, Decreased strength, Decreased mobility, Difficulty walking, Decreased balance, Decreased range of motion, Impaired perceived functional ability, Improper body mechanics, Impaired flexibility, Decreased coordination  Visit Diagnosis: Pain in right ankle and joints of right foot  Pain in left ankle and joints of left foot  Muscle weakness (generalized)  Difficulty in walking, not elsewhere classified     Problem List Patient Active Problem List   Diagnosis Date Noted   Effusion, right knee 10/28/2019   Acute medial meniscal tear, right, initial encounter 09/04/2019   Scot Jun, PT, DPT, OCS, ATC 03/01/21  3:02 PM    [facility] [address] [place], [place], [address] Phone: [phone]   Fax:  [phone]  Name: Lucile Didonato MRN: [id number] Date of Birth: [birth year]

## 2021-03-07 DIAGNOSIS — J3081 Allergic rhinitis due to animal (cat) (dog) hair and dander: Secondary | ICD-10-CM | POA: Diagnosis not present

## 2021-03-07 DIAGNOSIS — J3089 Other allergic rhinitis: Secondary | ICD-10-CM | POA: Diagnosis not present

## 2021-03-07 DIAGNOSIS — J301 Allergic rhinitis due to pollen: Secondary | ICD-10-CM | POA: Diagnosis not present

## 2021-03-10 ENCOUNTER — Encounter: Payer: Self-pay | Admitting: Rehabilitative and Restorative Service Providers"

## 2021-03-10 ENCOUNTER — Ambulatory Visit: Payer: Medicare HMO | Admitting: Rehabilitative and Restorative Service Providers"

## 2021-03-10 ENCOUNTER — Other Ambulatory Visit: Payer: Self-pay

## 2021-03-10 DIAGNOSIS — M6281 Muscle weakness (generalized): Secondary | ICD-10-CM | POA: Diagnosis not present

## 2021-03-10 DIAGNOSIS — M25571 Pain in right ankle and joints of right foot: Secondary | ICD-10-CM

## 2021-03-10 DIAGNOSIS — M25572 Pain in left ankle and joints of left foot: Secondary | ICD-10-CM

## 2021-03-10 DIAGNOSIS — R262 Difficulty in walking, not elsewhere classified: Secondary | ICD-10-CM | POA: Diagnosis not present

## 2021-03-10 NOTE — Therapy (Signed)
[facility] [address] [place], [place], [address] Phone: [phone]   Fax:  [phone]  Physical Therapy Treatment  Patient Details  Name: Juan Garrett MRN: [id number] Date of Birth: Oct 19, 1950 Referring Provider (PT): Persons, Bevely Palmer, [place]   Encounter Date: 03/10/2021   PT End of Session - 03/10/21 1251     Visit Number 3    Number of Visits 20    Date for PT Re-Evaluation 05/05/21    Authorization Type [employer] Medicare $35    Progress Note Due on Visit 10    PT Start Time 1252    PT Stop Time 1332    PT Time Calculation (min) 40 min    Activity Tolerance Patient tolerated treatment well    Behavior During Therapy [facility] for tasks assessed/performed             Past Medical History:  Diagnosis Date   Allergy    seasonal allergies   Arthritis    bilateral knees   Cataract    not a surgical candidate at this time (07/06/2020)   GERD (gastroesophageal reflux disease)    on meds   Hypertension    on meds    Past Surgical History:  Procedure Laterality Date   CARPAL TUNNEL RELEASE Bilateral 1997   CHONDROPLASTY Right 09/23/2019   Procedure: CHONDROPLASTY;  Surgeon: Leandrew Koyanagi, MD;  Location: [place];  Service: Orthopedics;  Laterality: Right;   KNEE ARTHROSCOPY WITH MEDIAL MENISECTOMY Right 09/23/2019   Procedure: RIGHT KNEE ARTHROSCOPY WITH PARTIAL MEDIAL MENISCECTOMY;  Surgeon: Leandrew Koyanagi, MD;  Location: [place];  Service: Orthopedics;  Laterality: Right;   KNEE SURGERY Left 1992    There were no vitals filed for this visit.   Subjective Assessment - 03/10/21 1254     Subjective Pt. stated things seem to be a bit better, less burning and pain not as bad.  Global Rating of Change +4 moderately better.    Limitations Standing;Walking    How long can you stand comfortably? a couple hours    Patient Stated Goals Reduce pain, standing/walking unrestricted    Currently in  Pain? No/denies    Pain Score 0-No pain   pain at worst since last visit : 4-5/10   Pain Onset More than a month ago                [facility] PT Assessment - 03/10/21 0001       Assessment   Medical Diagnosis M25.571 (ICD-10-CM) - Pain in right ankle and joints of right foot    Referring Provider (PT) Persons, Bevely Palmer, [place]    Onset Date/Surgical Date 12/09/20    Hand Dominance Right      AROM   Right Ankle Dorsiflexion 10   in 0 deg knee flexion : 0 degrees   Left Ankle Dorsiflexion 10   in 0 deg knee flexion : 4 degrees     Strength   Right Ankle Dorsiflexion 5/5    Right Ankle Plantar Flexion 5/5    Right Ankle Inversion 5/5    Right Ankle Eversion 5/5    Left Ankle Dorsiflexion 5/5    Left Ankle Plantar Flexion 5/5    Left Ankle Inversion 5/5    Left Ankle Eversion 5/5                           [facility] Adult PT Treatment/Exercise - 03/10/21 0001  Neuro Re-ed    Neuro Re-ed Details  tandem ambulation on foam 8 ft x 6 fwd/back each, fitter wobble board fwd/back control 30x each, SLS c cone touch (anterior, anterior/medial, anterior/lateral) x 10 each bilateral, SLS c 10 lb kettle bell side to side handoff 25 x each      Ankle Exercises: Aerobic   Recumbent Bike Lvl 3 6 mins      Ankle Exercises: Seated   Other Seated Ankle Exercises verbal review of band for HEP      Ankle Exercises: Standing   Other Standing Ankle Exercises Double leg PF c eccentric lowering focus single leg 20x each LE      Ankle Exercises: Stretches   Other Stretch incline gastroc stretch SL 60 sec x 3 bilateral                       PT Short Term Goals - 03/10/21 1308       PT SHORT TERM GOAL #1   Title Patient will demonstrate independent use of home exercise program to maintain progress from in clinic treatments.    Time 3    Period Weeks    Status Achieved    Target Date 03/17/21               PT Long Term Goals - 03/10/21 1308       PT  LONG TERM GOAL #1   Title Patient will demonstrate/report pain at worst less than or equal to 2/10 to facilitate minimal limitation in daily activity secondary to pain symptoms.    Time 10    Period Weeks    Status On-going    Target Date 05/05/21      PT LONG TERM GOAL #2   Title Patient will demonstrate independent use of home exercise program to facilitate ability to maintain/progress functional gains from skilled physical therapy services.    Time 10    Period Weeks    Status On-going    Target Date 05/05/21      PT LONG TERM GOAL #3   Title Pt. will demonstrate FOTO outcome > or = 68% to indicated reduced disability due to condition.    Time 10    Period Weeks    Status On-going    Target Date 05/05/21      PT LONG TERM GOAL #4   Title Pt. will demonstrate bilateral DF AROM > 10 degrees in 90 deg knee flexion, to neutral in 0 deg knee flexion to facilitate normalized heel to toe gait pattern for unrestricted ambulation/standing.    Time 10    Period Weeks    Status On-going    Target Date 05/05/21      PT LONG TERM GOAL #5   Title Pt. will demonstrate Rt ankle MMT equal to Lt 5/5 to facilitate usual standing, walking at PLOF.    Time 10    Period Weeks    Status Achieved                   Plan - 03/10/21 1256     Clinical Impression Statement Pt. has attended 3 visits, reporting moderately better on Global rating of Change.  See objective data for updated information.  Gains have been noted in all areas, specifically in DF mobility.  Continued skilled PT services indicated to continue progression in overall mobility and strength c eyes on long term management in HEP.    Personal Factors and Comorbidities Comorbidity  2    Comorbidities HTN, GERD    Examination-Activity Limitations Stand;Locomotion Level    Examination-Participation Restrictions Occupation;Community Activity;Shop;Interpersonal Relationship    Stability/Clinical Decision Making  Stable/Uncomplicated    Rehab Potential Good    PT Frequency Other (comment)   1-2x/week   PT Duration Other (comment)   10 weeks   PT Treatment/Interventions ADLs/Self Care Home Management;Cryotherapy;Electrical Stimulation;Iontophoresis 4mg /ml Dexamethasone;Moist Heat;Balance training;Therapeutic exercise;Therapeutic activities;Functional mobility training;Stair training;DME Instruction;Ultrasound;Neuromuscular re-education;Patient/family education;Spinal Manipulations;Passive range of motion;Dry needling;Joint Manipulations;Taping;Manual techniques    PT Next Visit Plan Continue manual to improve DF mobility, dynamic/compliant surface balance.    PT Home Exercise Plan [id number]    [id number] and Agree with Plan of Care Patient             Patient will benefit from skilled therapeutic intervention in order to improve the following deficits and impairments:  Pain, Hypomobility, Decreased activity tolerance, Decreased strength, Decreased mobility, Difficulty walking, Decreased balance, Decreased range of motion, Impaired perceived functional ability, Improper body mechanics, Impaired flexibility, Decreased coordination  Visit Diagnosis: Pain in right ankle and joints of right foot  Pain in left ankle and joints of left foot  Muscle weakness (generalized)  Difficulty in walking, not elsewhere classified     Problem List Patient Active Problem List   Diagnosis Date Noted   Effusion, right knee 10/28/2019   Acute medial meniscal tear, right, initial encounter 09/04/2019    Scot Jun, PT, DPT, OCS, ATC 03/10/21  1:31 PM    [facility] [address] [place], [place], [address] Phone: [phone]   Fax:  [phone]  Name: Juan Garrett MRN: [id number] Date of Birth: 1951/05/04

## 2021-03-13 ENCOUNTER — Ambulatory Visit: Payer: Medicare HMO | Admitting: Orthopedic Surgery

## 2021-03-13 ENCOUNTER — Encounter: Payer: Self-pay | Admitting: Orthopedic Surgery

## 2021-03-13 DIAGNOSIS — M25571 Pain in right ankle and joints of right foot: Secondary | ICD-10-CM

## 2021-03-13 DIAGNOSIS — M25572 Pain in left ankle and joints of left foot: Secondary | ICD-10-CM | POA: Diagnosis not present

## 2021-03-13 DIAGNOSIS — M25872 Other specified joint disorders, left ankle and foot: Secondary | ICD-10-CM

## 2021-03-15 ENCOUNTER — Encounter: Payer: Self-pay | Admitting: Physical Therapy

## 2021-03-15 ENCOUNTER — Ambulatory Visit: Payer: Medicare HMO | Admitting: Physical Therapy

## 2021-03-15 ENCOUNTER — Other Ambulatory Visit: Payer: Self-pay

## 2021-03-15 DIAGNOSIS — M6281 Muscle weakness (generalized): Secondary | ICD-10-CM | POA: Diagnosis not present

## 2021-03-15 DIAGNOSIS — M25571 Pain in right ankle and joints of right foot: Secondary | ICD-10-CM | POA: Diagnosis not present

## 2021-03-15 DIAGNOSIS — M25572 Pain in left ankle and joints of left foot: Secondary | ICD-10-CM

## 2021-03-15 DIAGNOSIS — R262 Difficulty in walking, not elsewhere classified: Secondary | ICD-10-CM

## 2021-03-15 NOTE — Therapy (Signed)
[facility] [address] [place], [place], [address] Phone: [phone]   Fax:  [phone]  Physical Therapy Treatment  Patient Details  Name: Juan Garrett MRN: [id number] Date of Birth: 28-Jul-1950 Referring Provider (PT): Persons, Bevely Palmer, [place]   Encounter Date: 03/15/2021   PT End of Session - 03/15/21 1511     Visit Number 4    Number of Visits 20    Date for PT Re-Evaluation 05/05/21    Authorization Type [employer] Medicare $35    Progress Note Due on Visit 10    PT Start Time 1430    PT Stop Time 1510    PT Time Calculation (min) 40 min    Activity Tolerance Patient tolerated treatment well    Behavior During Therapy [facility] for tasks assessed/performed             Past Medical History:  Diagnosis Date   Allergy    seasonal allergies   Arthritis    bilateral knees   Cataract    not a surgical candidate at this time (07/06/2020)   GERD (gastroesophageal reflux disease)    on meds   Hypertension    on meds    Past Surgical History:  Procedure Laterality Date   CARPAL TUNNEL RELEASE Bilateral 1997   CHONDROPLASTY Right 09/23/2019   Procedure: CHONDROPLASTY;  Surgeon: Leandrew Koyanagi, MD;  Location: [place];  Service: Orthopedics;  Laterality: Right;   KNEE ARTHROSCOPY WITH MEDIAL MENISECTOMY Right 09/23/2019   Procedure: RIGHT KNEE ARTHROSCOPY WITH PARTIAL MEDIAL MENISCECTOMY;  Surgeon: Leandrew Koyanagi, MD;  Location: [place];  Service: Orthopedics;  Laterality: Right;   KNEE SURGERY Left 1992    There were no vitals filed for this visit.   Subjective Assessment - 03/15/21 1433     Subjective had injection on Monday when he saw Dr. Sharol Given.  Lt ankle was a little more swollen this week.    Limitations Standing;Walking    How long can you stand comfortably? a couple hours    Patient Stated Goals Reduce pain, standing/walking unrestricted    Currently in Pain? Yes    Pain Score 2      Pain Location Foot    Pain Orientation Left;Right    Pain Descriptors / Indicators Aching;Burning;Sore;Sharp;Tightness    Pain Type Chronic pain    Pain Onset More than a month ago    Pain Frequency Intermittent    Aggravating Factors  golf, releasing Lt soleus stretch, walking/standing    Pain Relieving Factors stretching helped some                               [facility] Adult PT Treatment/Exercise - 03/15/21 1432       Manual Therapy   Manual therapy comments Mobilization c movement posterior talocrural glide c DF on step bilateral      Ankle Exercises: Aerobic   Recumbent Bike L4 x 6 min      Ankle Exercises: Stretches   Slant Board Stretch 3 reps;60 seconds   bil, performed SL     Ankle Exercises: Standing   SLS on foam with contralateral LE movement x 5 min, performed bil; 10# KB alternating hands x 25 each bil    Balance Beam sidestepping and tandem walking fwd/bwd x 4 laps eah    Other Standing Ankle Exercises on slant board: Double leg PF c eccentric lowering focus single leg  20x each LE    Other Standing Ankle Exercises heel tap off 6" step keeping foot on step without raising heel 2x10 bil; then crossover lateral step ups 2x10 bil                       PT Short Term Goals - 03/10/21 1308       PT SHORT TERM GOAL #1   Title Patient will demonstrate independent use of home exercise program to maintain progress from in clinic treatments.    Time 3    Period Weeks    Status Achieved    Target Date 03/17/21               PT Long Term Goals - 03/10/21 1308       PT LONG TERM GOAL #1   Title Patient will demonstrate/report pain at worst less than or equal to 2/10 to facilitate minimal limitation in daily activity secondary to pain symptoms.    Time 10    Period Weeks    Status On-going    Target Date 05/05/21      PT LONG TERM GOAL #2   Title Patient will demonstrate independent use of home exercise program to facilitate  ability to maintain/progress functional gains from skilled physical therapy services.    Time 10    Period Weeks    Status On-going    Target Date 05/05/21      PT LONG TERM GOAL #3   Title Pt. will demonstrate FOTO outcome > or = 68% to indicated reduced disability due to condition.    Time 10    Period Weeks    Status On-going    Target Date 05/05/21      PT LONG TERM GOAL #4   Title Pt. will demonstrate bilateral DF AROM > 10 degrees in 90 deg knee flexion, to neutral in 0 deg knee flexion to facilitate normalized heel to toe gait pattern for unrestricted ambulation/standing.    Time 10    Period Weeks    Status On-going    Target Date 05/05/21      PT LONG TERM GOAL #5   Title Pt. will demonstrate Rt ankle MMT equal to Lt 5/5 to facilitate usual standing, walking at PLOF.    Time 10    Period Weeks    Status Achieved                   Plan - 03/15/21 1515     Clinical Impression Statement Pt tolerated session well today, and reporting slight improvement in symptoms.  Will continue to benefit from PT to maximize function.  Still has difficulty especially with Lt dorsiflexion.    Personal Factors and Comorbidities Comorbidity 2    Comorbidities HTN, GERD    Examination-Activity Limitations Stand;Locomotion Level    Examination-Participation Restrictions Occupation;Community Activity;Shop;Interpersonal Relationship    Stability/Clinical Decision Making Stable/Uncomplicated    Rehab Potential Good    PT Frequency Other (comment)   1-2x/week   PT Duration Other (comment)   10 weeks   PT Treatment/Interventions ADLs/Self Care Home Management;Cryotherapy;Electrical Stimulation;Iontophoresis 4mg /ml Dexamethasone;Moist Heat;Balance training;Therapeutic exercise;Therapeutic activities;Functional mobility training;Stair training;DME Instruction;Ultrasound;Neuromuscular re-education;Patient/family education;Spinal Manipulations;Passive range of motion;Dry needling;Joint  Manipulations;Taping;Manual techniques    PT Next Visit Plan Continue manual to improve DF mobility, dynamic/compliant surface balance.; eccentric soleus work    PT [place] and Agree with Plan of Care Patient  Patient will benefit from skilled therapeutic intervention in order to improve the following deficits and impairments:  Pain, Hypomobility, Decreased activity tolerance, Decreased strength, Decreased mobility, Difficulty walking, Decreased balance, Decreased range of motion, Impaired perceived functional ability, Improper body mechanics, Impaired flexibility, Decreased coordination  Visit Diagnosis: Pain in right ankle and joints of right foot  Pain in left ankle and joints of left foot  Muscle weakness (generalized)  Difficulty in walking, not elsewhere classified     Problem List Patient Active Problem List   Diagnosis Date Noted   Effusion, right knee 10/28/2019   Acute medial meniscal tear, right, initial encounter 09/04/2019      Laureen Abrahams, PT, DPT 03/15/21 3:18 PM     [facility] [address] [place], [place], [address] Phone: [phone]   Fax:  [phone]  Name: Astin Sayre MRN: [id number] Date of Birth: March 10, 1951

## 2021-03-17 ENCOUNTER — Encounter: Payer: Medicare HMO | Admitting: Rehabilitative and Restorative Service Providers"

## 2021-03-20 DIAGNOSIS — J301 Allergic rhinitis due to pollen: Secondary | ICD-10-CM | POA: Diagnosis not present

## 2021-03-20 DIAGNOSIS — J3089 Other allergic rhinitis: Secondary | ICD-10-CM | POA: Diagnosis not present

## 2021-03-20 DIAGNOSIS — J3081 Allergic rhinitis due to animal (cat) (dog) hair and dander: Secondary | ICD-10-CM | POA: Diagnosis not present

## 2021-03-24 ENCOUNTER — Other Ambulatory Visit: Payer: Self-pay

## 2021-03-24 ENCOUNTER — Ambulatory Visit: Payer: Medicare HMO | Admitting: Rehabilitative and Restorative Service Providers"

## 2021-03-24 ENCOUNTER — Encounter: Payer: Self-pay | Admitting: Rehabilitative and Restorative Service Providers"

## 2021-03-24 DIAGNOSIS — M25572 Pain in left ankle and joints of left foot: Secondary | ICD-10-CM

## 2021-03-24 DIAGNOSIS — M6281 Muscle weakness (generalized): Secondary | ICD-10-CM | POA: Diagnosis not present

## 2021-03-24 DIAGNOSIS — R262 Difficulty in walking, not elsewhere classified: Secondary | ICD-10-CM

## 2021-03-24 DIAGNOSIS — M25571 Pain in right ankle and joints of right foot: Secondary | ICD-10-CM | POA: Diagnosis not present

## 2021-03-24 NOTE — Therapy (Addendum)
[facility] [address] [place], [place], [address] Phone: [phone]   Fax:  [phone]  Physical Therapy Treatment /Discharge   Patient Details  Name: Juan Garrett MRN: [id number] Date of Birth: [birth year] Referring Provider (PT): Persons, Bevely Palmer, [place]   Encounter Date: 03/24/2021   PT End of Session - 03/24/21 0839     Visit Number 5    Number of Visits 20    Date for PT Re-Evaluation 05/05/21    Authorization Type [employer] $35    Progress Note Due on Visit 10    PT Start Time [phone]    PT Stop Time 0901    PT Time Calculation (min) 23 min    Activity Tolerance Patient tolerated treatment well    Behavior During Therapy [facility] for tasks assessed/performed             Past Medical History:  Diagnosis Date   Allergy    seasonal allergies   Arthritis    bilateral knees   Cataract    not a surgical candidate at this time (07/06/2020)   GERD (gastroesophageal reflux disease)    on meds   Hypertension    on meds    Past Surgical History:  Procedure Laterality Date   CARPAL TUNNEL RELEASE Bilateral 1997   CHONDROPLASTY Right 09/23/2019   Procedure: CHONDROPLASTY;  Surgeon: Leandrew Koyanagi, MD;  Location: [place];  Service: Orthopedics;  Laterality: Right;   KNEE ARTHROSCOPY WITH MEDIAL MENISECTOMY Right 09/23/2019   Procedure: RIGHT KNEE ARTHROSCOPY WITH PARTIAL MEDIAL MENISCECTOMY;  Surgeon: Leandrew Koyanagi, MD;  Location: [place];  Service: Orthopedics;  Laterality: Right;   KNEE SURGERY Left 1992    There were no vitals filed for this visit.   Subjective Assessment - 03/24/21 0843     Subjective Pt. indicated he has been doing fairly well.  Pt. indicated getting new shoes and thought it was helping foot pain complaints.  Pt. indicated he felt comfortable c HEP at this time.  Global Rating of change +4 moderately better.    Limitations Standing;Walking    How long can you  stand comfortably? a couple hours    Patient Stated Goals Reduce pain, standing/walking unrestricted    Currently in Pain? No/denies    Pain Score 0-No pain    Pain Onset More than a month ago                [facility] PT Assessment - 03/24/21 0001       Assessment   Medical Diagnosis M25.571 (ICD-10-CM) - Pain in right ankle and joints of right foot    Referring Provider (PT) Persons, Bevely Palmer, [place]    Onset Date/Surgical Date 12/09/20    Hand Dominance Right      Observation/Other Assessments   Focus on Therapeutic Outcomes (FOTO)  updated 67%      Single Leg Stance   Comments bilateral SLS > 15 seconds, no complaints                           [facility] Adult PT Treatment/Exercise - 03/24/21 0001       Exercises   Other Exercises  HEP review c handout provided again      Ankle Exercises: Aerobic   Recumbent Bike Lvl 4 7 mins      Ankle Exercises: Standing   Other Standing Ankle Exercises 6 inch lateral step down c heel  touch 2 x 10 bilateral, aerobic step over lateral x 5 each way 6 inch step                     PT Education - 03/24/21 0855     Education Details HEP, D/C review    Person(s) Educated Patient    Methods Explanation;Demonstration;Verbal cues;Handout    Comprehension Verbalized understanding;Returned demonstration              PT Short Term Goals - 03/10/21 1308       PT SHORT TERM GOAL #1   Title Patient will demonstrate independent use of home exercise program to maintain progress from in clinic treatments.    Time 3    Period Weeks    Status Achieved    Target Date 03/17/21               PT Long Term Goals - 03/24/21 0900       PT LONG TERM GOAL #1   Title Patient will demonstrate/report pain at worst less than or equal to 2/10 to facilitate minimal limitation in daily activity secondary to pain symptoms.    Time 10    Period Weeks    Status Partially Met    Target Date 05/05/21      PT LONG TERM  GOAL #2   Title Patient will demonstrate independent use of home exercise program to facilitate ability to maintain/progress functional gains from skilled physical therapy services.    Time 10    Period Weeks    Status Achieved    Target Date 05/05/21      PT LONG TERM GOAL #3   Title Pt. will demonstrate FOTO outcome > or = 68% to indicated reduced disability due to condition.    Time 10    Period Weeks    Status Partially Met    Target Date 05/05/21      PT LONG TERM GOAL #4   Title Pt. will demonstrate bilateral DF AROM > 10 degrees in 90 deg knee flexion, to neutral in 0 deg knee flexion to facilitate normalized heel to toe gait pattern for unrestricted ambulation/standing.    Time 10    Period Weeks    Status Achieved    Target Date 05/05/21      PT LONG TERM GOAL #5   Title Pt. will demonstrate Rt ankle MMT equal to Lt 5/5 to facilitate usual standing, walking at PLOF.    Time 10    Period Weeks    Status Achieved    Target Date 05/05/21                   Plan - 03/24/21 2505     Clinical Impression Statement Pt. has attended 5 visits overall during course of treatment, reporting global rating of change +4 at this time.  See objective data for updated information.  Gains noted in symptoms and functional activity tolerance to this point.  Recommend continued HEP at this time for continued gains.  Trial d/c indicated at this time.    Personal Factors and Comorbidities Comorbidity 2    Comorbidities HTN, GERD    Examination-Activity Limitations Stand;Locomotion Level    Examination-Participation Restrictions Occupation;Community Activity;Shop;Interpersonal Relationship    Stability/Clinical Decision Making Stable/Uncomplicated    Rehab Potential Good    PT Frequency Other (comment)   1-2x/week   PT Duration Other (comment)   10 weeks   PT Treatment/Interventions ADLs/Self Care Home Management;Cryotherapy;Electrical Stimulation;Iontophoresis  59m/ml  Dexamethasone;Moist Heat;Balance training;Therapeutic exercise;Therapeutic activities;Functional mobility training;Stair training;DME Instruction;Ultrasound;Neuromuscular re-education;Patient/family education;Spinal Manipulations;Passive range of motion;Dry needling;Joint Manipulations;Taping;Manual techniques    PT Next Visit Plan trial HEP, d/c after 30 days    PT Home Exercise Plan [email or web address]    Consulted and Agree with Plan of Care Patient             Patient will benefit from skilled therapeutic intervention in order to improve the following deficits and impairments:  Pain, Hypomobility, Decreased activity tolerance, Decreased strength, Decreased mobility, Difficulty walking, Decreased balance, Decreased range of motion, Impaired perceived functional ability, Improper body mechanics, Impaired flexibility, Decreased coordination  Visit Diagnosis: Pain in right ankle and joints of right foot  Pain in left ankle and joints of left foot  Muscle weakness (generalized)  Difficulty in walking, not elsewhere classified     Problem List Patient Active Problem List   Diagnosis Date Noted   Effusion, right knee 10/28/2019   Acute medial meniscal tear, right, initial encounter 09/04/2019    MScot Jun PT, DPT, OCS, ATC 03/24/21  9:02 AM  PHYSICAL THERAPY DISCHARGE SUMMARY  Visits from Start of Care: 5  Current functional level related to goals / functional outcomes: See note   Remaining deficits: See note   Education / Equipment: HEP   Patient agrees to discharge. Patient goals were partially met. Patient is being discharged due to being pleased with the current functional level.  MScot Jun PT, DPT, OCS, ATC 05/18/21  10:41 AM       [facility] [address] [address]Phone: [phone]  Fax:  [phone] Name: MSadao WeyerMRN: [id number] of Birth: [birth year]

## 2021-03-24 NOTE — Patient Instructions (Signed)
Access Code: [email or web address] URL: [email or web address] Date: 03/24/2021 Prepared by: Scot Jun  Exercises Gastroc Stretch on Wall - 2 x daily - 7 x weekly - 1 sets - 5 reps - 30 hold Standing Gastroc Stretch on Step - 2 x daily - 7 x weekly - 1 sets - 5 reps - 30 sec hold Soleus Stretch on Wall - 2 x daily - 7 x weekly - 1 sets - 5 reps - 30 seconds hold Standing Dorsiflexion Self-Mobilization on Step - 2 x daily - 7 x weekly - 3 sets - 10 reps - 2-3 hold Single Leg Stance - 1 x daily - 7 x weekly - 1 sets - 5 reps - 30 hold Long Sitting Ankle Dorsiflexion with Anchored Resistance - 2 x daily - 7 x weekly - 3 sets - 10 reps Long Sitting Ankle Plantar Flexion with Resistance - 2 x daily - 7 x weekly - 3 sets - 10 reps Long Sitting Ankle Inversion with Resistance - 2 x daily - 7 x weekly - 3 sets - 10 reps Long Sitting Ankle Eversion with Resistance - 2 x daily - 7 x weekly - 3 sets - 10 reps

## 2021-03-26 DIAGNOSIS — M25872 Other specified joint disorders, left ankle and foot: Secondary | ICD-10-CM

## 2021-03-26 DIAGNOSIS — M25571 Pain in right ankle and joints of right foot: Secondary | ICD-10-CM | POA: Diagnosis not present

## 2021-03-26 DIAGNOSIS — M25572 Pain in left ankle and joints of left foot: Secondary | ICD-10-CM | POA: Diagnosis not present

## 2021-03-26 MED ORDER — LIDOCAINE HCL 1 % IJ SOLN
2.0000 mL | INTRAMUSCULAR | Status: AC | PRN
  Administered 2021-03-26: 2 mL

## 2021-03-26 MED ORDER — METHYLPREDNISOLONE ACETATE 40 MG/ML IJ SUSP
40.0000 mg | INTRAMUSCULAR | Status: AC | PRN
  Administered 2021-03-26: 40 mg via INTRA_ARTICULAR

## 2021-03-26 NOTE — Progress Notes (Signed)
Office Visit Note   Patient: Juan Garrett           Date of Birth: Apr 03, 1951           MRN: [id number] Visit Date: 03/13/2021              Requested by: Wenda Low, MD [address] [place] [address] [place],  [place] [address] PCP: Wenda Low, MD  Chief Complaint  Patient presents with   Left Foot - Pain, Follow-up   Right Foot - Pain, Follow-up   Left Ankle - Pain, Follow-up   Right Ankle - Pain, Follow-up      HPI: Patient is a 70 year old gentleman who presents complaining of bilateral forefoot pain.  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  He states he has done some therapy which has helped.  He is not taking Neurontin.  He does have a stiff soled sneaker.  He occasionally has burning sensation across the forefoot.  He does have inserts.  Assessment & Plan: Visit Diagnoses:  1. Impingement syndrome of left ankle   2. Pain in right ankle and joints of right foot   3. Pain in left ankle and joints of left foot     Plan: Left ankle was injected he tolerated this well recommended continue with Achilles stretching and shoe wear and orthotics.  Follow-Up Instructions: No follow-ups on file.   Ortho Exam  Patient is alert, oriented, no adenopathy, well-dressed, normal affect, normal respiratory effort. Examination patient has pain to palpation anteriorly over the left ankle joint.  He does have significant varus collapse of the left knee with osteoarthritis.  Examination of the right foot he does have a long second and third metatarsal by x-ray with flattening of the metatarsal heads the right foot is pain to palpation of the ball of the foot he has dorsiflexion to neutral with Achilles tightness.  Imaging: No results found. No images are attached to the encounter.  Labs: No results found for: HGBA1C, ESRSEDRATE, CRP, LABURIC, REPTSTATUS, GRAMSTAIN, CULT, LABORGA   No results found for: ALBUMIN, PREALBUMIN, CBC  No results found for: MG No results  found for: VD25OH  No results found for: PREALBUMIN No flowsheet data found.   There is no height or weight on file to calculate BMI.  Orders:  Orders Placed This Encounter  Procedures   Medium Joint Inj: L ankle   No orders of the defined types were placed in this encounter.    Procedures: Medium Joint Inj: L ankle on 03/26/2021 10:27 AM Indications: pain and diagnostic evaluation Details: 22 G 1.5 in needle, anteromedial approach Medications: 2 mL lidocaine 1 %; 40 mg methylPREDNISolone acetate 40 MG/ML Outcome: tolerated well, no immediate complications Procedure, treatment alternatives, risks and benefits explained, specific risks discussed. Consent was given by the patient. Immediately prior to procedure a time out was called to verify the correct patient, procedure, equipment, support staff and site/side marked as required. Patient was prepped and draped in the usual sterile fashion.     Clinical Data: No additional findings.  ROS:  All other systems negative, except as noted in the HPI. Review of Systems  Objective: Vital Signs: There were no vitals taken for this visit.  Specialty Comments:  No specialty comments available.  PMFS History: Patient Active Problem List   Diagnosis Date Noted   Effusion, right knee 10/28/2019   Acute medial meniscal tear, right, initial encounter 09/04/2019   Past Medical History:  Diagnosis Date  Allergy    seasonal allergies   Arthritis    bilateral knees   Cataract    not a surgical candidate at this time (07/06/2020)   GERD (gastroesophageal reflux disease)    on meds   Hypertension    on meds    Family History  Problem Relation Age of Onset   Colon polyps Neg Hx    Colon cancer Neg Hx    Esophageal cancer Neg Hx    Stomach cancer Neg Hx    Rectal cancer Neg Hx     Past Surgical History:  Procedure Laterality Date   CARPAL TUNNEL RELEASE Bilateral 1997   CHONDROPLASTY Right 09/23/2019   Procedure:  CHONDROPLASTY;  Surgeon: Leandrew Koyanagi, MD;  Location: [place];  Service: Orthopedics;  Laterality: Right;   KNEE ARTHROSCOPY WITH MEDIAL MENISECTOMY Right 09/23/2019   Procedure: RIGHT KNEE ARTHROSCOPY WITH PARTIAL MEDIAL MENISCECTOMY;  Surgeon: Leandrew Koyanagi, MD;  Location: [place];  Service: Orthopedics;  Laterality: Right;   KNEE SURGERY Left 1992   Social History   Occupational History   Not on file  Tobacco Use   Smoking status: Former   Smokeless tobacco: Never  Vaping Use   Vaping Use: Never used  Substance and Sexual Activity   Alcohol use: Not Currently   Drug use: Never   Sexual activity: Not on file

## 2021-03-31 ENCOUNTER — Encounter: Payer: Medicare HMO | Admitting: Rehabilitative and Restorative Service Providers"

## 2021-04-03 DIAGNOSIS — J301 Allergic rhinitis due to pollen: Secondary | ICD-10-CM | POA: Diagnosis not present

## 2021-04-03 DIAGNOSIS — J3089 Other allergic rhinitis: Secondary | ICD-10-CM | POA: Diagnosis not present

## 2021-04-03 DIAGNOSIS — J3081 Allergic rhinitis due to animal (cat) (dog) hair and dander: Secondary | ICD-10-CM | POA: Diagnosis not present

## 2021-04-06 DIAGNOSIS — Z1389 Encounter for screening for other disorder: Secondary | ICD-10-CM | POA: Diagnosis not present

## 2021-04-06 DIAGNOSIS — E559 Vitamin D deficiency, unspecified: Secondary | ICD-10-CM | POA: Diagnosis not present

## 2021-04-06 DIAGNOSIS — K219 Gastro-esophageal reflux disease without esophagitis: Secondary | ICD-10-CM | POA: Diagnosis not present

## 2021-04-06 DIAGNOSIS — H8110 Benign paroxysmal vertigo, unspecified ear: Secondary | ICD-10-CM | POA: Diagnosis not present

## 2021-04-06 DIAGNOSIS — I10 Essential (primary) hypertension: Secondary | ICD-10-CM | POA: Diagnosis not present

## 2021-04-06 DIAGNOSIS — Z0000 Encounter for general adult medical examination without abnormal findings: Secondary | ICD-10-CM | POA: Diagnosis not present

## 2021-04-06 DIAGNOSIS — Z125 Encounter for screening for malignant neoplasm of prostate: Secondary | ICD-10-CM | POA: Diagnosis not present

## 2021-04-06 DIAGNOSIS — Z23 Encounter for immunization: Secondary | ICD-10-CM | POA: Diagnosis not present

## 2021-04-06 DIAGNOSIS — E785 Hyperlipidemia, unspecified: Secondary | ICD-10-CM | POA: Diagnosis not present

## 2021-04-06 DIAGNOSIS — N182 Chronic kidney disease, stage 2 (mild): Secondary | ICD-10-CM | POA: Diagnosis not present

## 2021-04-17 DIAGNOSIS — J3089 Other allergic rhinitis: Secondary | ICD-10-CM | POA: Diagnosis not present

## 2021-04-17 DIAGNOSIS — J3081 Allergic rhinitis due to animal (cat) (dog) hair and dander: Secondary | ICD-10-CM | POA: Diagnosis not present

## 2021-04-17 DIAGNOSIS — J301 Allergic rhinitis due to pollen: Secondary | ICD-10-CM | POA: Diagnosis not present

## 2021-04-21 DIAGNOSIS — D225 Melanocytic nevi of trunk: Secondary | ICD-10-CM | POA: Diagnosis not present

## 2021-04-21 DIAGNOSIS — L570 Actinic keratosis: Secondary | ICD-10-CM | POA: Diagnosis not present

## 2021-04-21 DIAGNOSIS — L821 Other seborrheic keratosis: Secondary | ICD-10-CM | POA: Diagnosis not present

## 2021-04-21 DIAGNOSIS — L814 Other melanin hyperpigmentation: Secondary | ICD-10-CM | POA: Diagnosis not present

## 2021-04-21 DIAGNOSIS — L538 Other specified erythematous conditions: Secondary | ICD-10-CM | POA: Diagnosis not present

## 2021-04-21 DIAGNOSIS — L820 Inflamed seborrheic keratosis: Secondary | ICD-10-CM | POA: Diagnosis not present

## 2021-05-09 DIAGNOSIS — J3081 Allergic rhinitis due to animal (cat) (dog) hair and dander: Secondary | ICD-10-CM | POA: Diagnosis not present

## 2021-05-09 DIAGNOSIS — J3089 Other allergic rhinitis: Secondary | ICD-10-CM | POA: Diagnosis not present

## 2021-05-09 DIAGNOSIS — J301 Allergic rhinitis due to pollen: Secondary | ICD-10-CM | POA: Diagnosis not present

## 2021-05-23 DIAGNOSIS — J301 Allergic rhinitis due to pollen: Secondary | ICD-10-CM | POA: Diagnosis not present

## 2021-05-23 DIAGNOSIS — J3081 Allergic rhinitis due to animal (cat) (dog) hair and dander: Secondary | ICD-10-CM | POA: Diagnosis not present

## 2021-05-23 DIAGNOSIS — J3089 Other allergic rhinitis: Secondary | ICD-10-CM | POA: Diagnosis not present

## 2021-06-22 DIAGNOSIS — J301 Allergic rhinitis due to pollen: Secondary | ICD-10-CM | POA: Diagnosis not present

## 2021-06-22 DIAGNOSIS — J3081 Allergic rhinitis due to animal (cat) (dog) hair and dander: Secondary | ICD-10-CM | POA: Diagnosis not present

## 2021-06-22 DIAGNOSIS — J3089 Other allergic rhinitis: Secondary | ICD-10-CM | POA: Diagnosis not present

## 2021-06-23 DIAGNOSIS — J3081 Allergic rhinitis due to animal (cat) (dog) hair and dander: Secondary | ICD-10-CM | POA: Diagnosis not present

## 2021-06-23 DIAGNOSIS — J3089 Other allergic rhinitis: Secondary | ICD-10-CM | POA: Diagnosis not present

## 2021-06-23 DIAGNOSIS — J301 Allergic rhinitis due to pollen: Secondary | ICD-10-CM | POA: Diagnosis not present

## 2021-07-04 DIAGNOSIS — J3081 Allergic rhinitis due to animal (cat) (dog) hair and dander: Secondary | ICD-10-CM | POA: Diagnosis not present

## 2021-07-04 DIAGNOSIS — J301 Allergic rhinitis due to pollen: Secondary | ICD-10-CM | POA: Diagnosis not present

## 2021-07-04 DIAGNOSIS — J3089 Other allergic rhinitis: Secondary | ICD-10-CM | POA: Diagnosis not present

## 2021-07-05 ENCOUNTER — Other Ambulatory Visit: Payer: Self-pay | Admitting: Internal Medicine

## 2021-07-05 ENCOUNTER — Ambulatory Visit
Admission: RE | Admit: 2021-07-05 | Discharge: 2021-07-05 | Disposition: A | Discharge status: Home / Self Care | Payer: Medicare HMO | Source: Ambulatory Visit | Attending: Internal Medicine | Admitting: Internal Medicine

## 2021-07-05 DIAGNOSIS — R059 Cough, unspecified: Secondary | ICD-10-CM | POA: Diagnosis not present

## 2021-07-05 DIAGNOSIS — R052 Subacute cough: Secondary | ICD-10-CM

## 2021-07-05 DIAGNOSIS — R5383 Other fatigue: Secondary | ICD-10-CM | POA: Diagnosis not present

## 2021-07-19 DIAGNOSIS — J3081 Allergic rhinitis due to animal (cat) (dog) hair and dander: Secondary | ICD-10-CM | POA: Diagnosis not present

## 2021-07-19 DIAGNOSIS — J301 Allergic rhinitis due to pollen: Secondary | ICD-10-CM | POA: Diagnosis not present

## 2021-07-19 DIAGNOSIS — J3089 Other allergic rhinitis: Secondary | ICD-10-CM | POA: Diagnosis not present

## 2021-08-03 DIAGNOSIS — J301 Allergic rhinitis due to pollen: Secondary | ICD-10-CM | POA: Diagnosis not present

## 2021-08-03 DIAGNOSIS — J3081 Allergic rhinitis due to animal (cat) (dog) hair and dander: Secondary | ICD-10-CM | POA: Diagnosis not present

## 2021-08-03 DIAGNOSIS — J3089 Other allergic rhinitis: Secondary | ICD-10-CM | POA: Diagnosis not present

## 2021-08-17 DIAGNOSIS — J3089 Other allergic rhinitis: Secondary | ICD-10-CM | POA: Diagnosis not present

## 2021-08-17 DIAGNOSIS — J3081 Allergic rhinitis due to animal (cat) (dog) hair and dander: Secondary | ICD-10-CM | POA: Diagnosis not present

## 2021-08-17 DIAGNOSIS — J301 Allergic rhinitis due to pollen: Secondary | ICD-10-CM | POA: Diagnosis not present

## 2021-08-24 DIAGNOSIS — H524 Presbyopia: Secondary | ICD-10-CM | POA: Diagnosis not present

## 2021-08-24 DIAGNOSIS — H2513 Age-related nuclear cataract, bilateral: Secondary | ICD-10-CM | POA: Diagnosis not present

## 2021-08-24 DIAGNOSIS — H16223 Keratoconjunctivitis sicca, not specified as Sjogren's, bilateral: Secondary | ICD-10-CM | POA: Diagnosis not present

## 2021-08-31 DIAGNOSIS — J3089 Other allergic rhinitis: Secondary | ICD-10-CM | POA: Diagnosis not present

## 2021-08-31 DIAGNOSIS — J3081 Allergic rhinitis due to animal (cat) (dog) hair and dander: Secondary | ICD-10-CM | POA: Diagnosis not present

## 2021-08-31 DIAGNOSIS — J301 Allergic rhinitis due to pollen: Secondary | ICD-10-CM | POA: Diagnosis not present

## 2021-10-17 DIAGNOSIS — J3081 Allergic rhinitis due to animal (cat) (dog) hair and dander: Secondary | ICD-10-CM | POA: Diagnosis not present

## 2021-10-17 DIAGNOSIS — J301 Allergic rhinitis due to pollen: Secondary | ICD-10-CM | POA: Diagnosis not present

## 2021-10-17 DIAGNOSIS — J3089 Other allergic rhinitis: Secondary | ICD-10-CM | POA: Diagnosis not present

## 2021-10-18 DIAGNOSIS — J3081 Allergic rhinitis due to animal (cat) (dog) hair and dander: Secondary | ICD-10-CM | POA: Diagnosis not present

## 2021-10-18 DIAGNOSIS — J301 Allergic rhinitis due to pollen: Secondary | ICD-10-CM | POA: Diagnosis not present

## 2021-10-18 DIAGNOSIS — L219 Seborrheic dermatitis, unspecified: Secondary | ICD-10-CM | POA: Diagnosis not present

## 2021-10-18 DIAGNOSIS — J3089 Other allergic rhinitis: Secondary | ICD-10-CM | POA: Diagnosis not present

## 2021-11-02 DIAGNOSIS — J3081 Allergic rhinitis due to animal (cat) (dog) hair and dander: Secondary | ICD-10-CM | POA: Diagnosis not present

## 2021-11-02 DIAGNOSIS — J301 Allergic rhinitis due to pollen: Secondary | ICD-10-CM | POA: Diagnosis not present

## 2021-11-02 DIAGNOSIS — J3089 Other allergic rhinitis: Secondary | ICD-10-CM | POA: Diagnosis not present

## 2021-11-16 DIAGNOSIS — J301 Allergic rhinitis due to pollen: Secondary | ICD-10-CM | POA: Diagnosis not present

## 2021-11-16 DIAGNOSIS — J3081 Allergic rhinitis due to animal (cat) (dog) hair and dander: Secondary | ICD-10-CM | POA: Diagnosis not present

## 2021-11-16 DIAGNOSIS — J3089 Other allergic rhinitis: Secondary | ICD-10-CM | POA: Diagnosis not present

## 2021-11-30 DIAGNOSIS — J3081 Allergic rhinitis due to animal (cat) (dog) hair and dander: Secondary | ICD-10-CM | POA: Diagnosis not present

## 2021-11-30 DIAGNOSIS — J301 Allergic rhinitis due to pollen: Secondary | ICD-10-CM | POA: Diagnosis not present

## 2021-11-30 DIAGNOSIS — J3089 Other allergic rhinitis: Secondary | ICD-10-CM | POA: Diagnosis not present

## 2021-12-13 DIAGNOSIS — J3081 Allergic rhinitis due to animal (cat) (dog) hair and dander: Secondary | ICD-10-CM | POA: Diagnosis not present

## 2021-12-13 DIAGNOSIS — J3089 Other allergic rhinitis: Secondary | ICD-10-CM | POA: Diagnosis not present

## 2021-12-13 DIAGNOSIS — J301 Allergic rhinitis due to pollen: Secondary | ICD-10-CM | POA: Diagnosis not present

## 2021-12-28 DIAGNOSIS — J301 Allergic rhinitis due to pollen: Secondary | ICD-10-CM | POA: Diagnosis not present

## 2021-12-28 DIAGNOSIS — J3089 Other allergic rhinitis: Secondary | ICD-10-CM | POA: Diagnosis not present

## 2021-12-28 DIAGNOSIS — J3081 Allergic rhinitis due to animal (cat) (dog) hair and dander: Secondary | ICD-10-CM | POA: Diagnosis not present

## 2022-01-12 IMAGING — MR MR KNEE*R* W/O CM
6 series · 39 of 40 positions shown · non-contrast
Comparison: Radiographs from 08/19/2019

CLINICAL DATA: Chronic knee pain and popping

EXAM:
MRI OF THE RIGHT KNEE WITHOUT CONTRAST
TECHNIQUE: Multiplanar, multisequence MR imaging of the knee was performed. No
intravenous contrast was administered.

[Series 5: T2 fat-sat · axial · right · 4.0mm · 0.50mm/px · z∈[-55,+85]mm · 8 of 33 slices shown (1 of 3)]
[im 1/33]
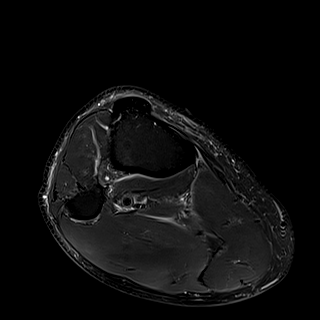
[im 5/33]
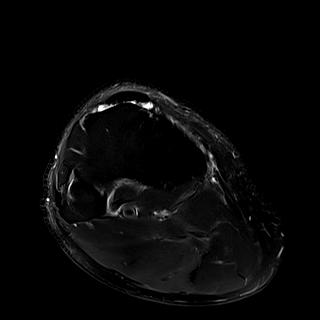
[im 10/33]
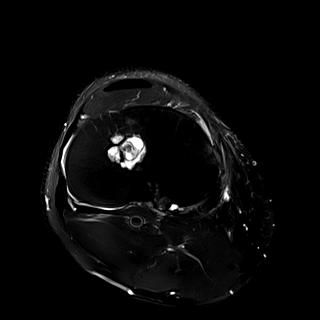
[im 14/33]
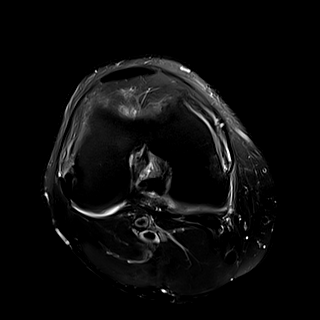
[im 19/33]
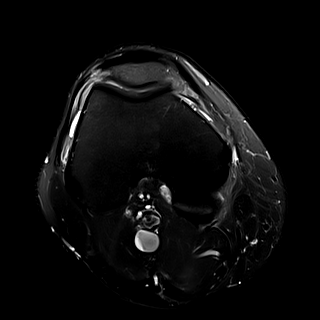
[im 23/33]
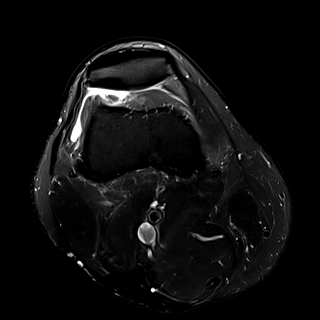
[im 28/33]
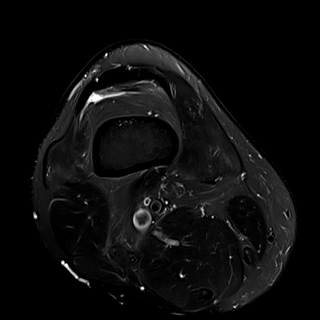
[im 33/33]
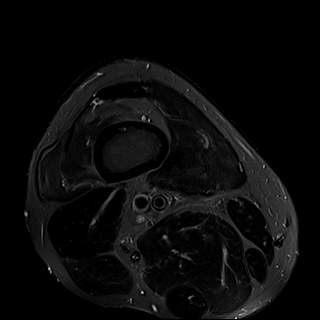

[Series 6: T1 · coronal · right · 4.0mm · 0.29mm/px · 5 of 25 slices shown]
[im 1/25]
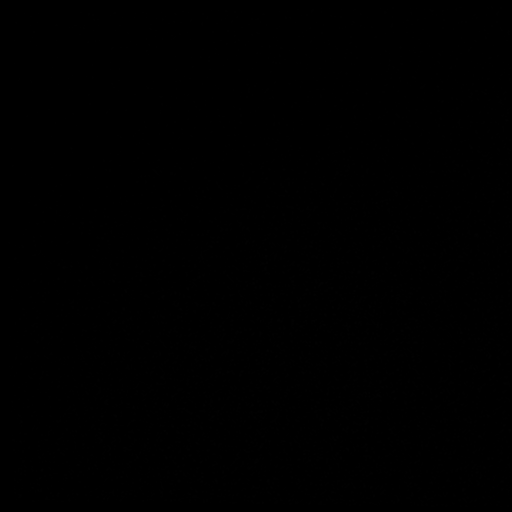
[im 5/25]
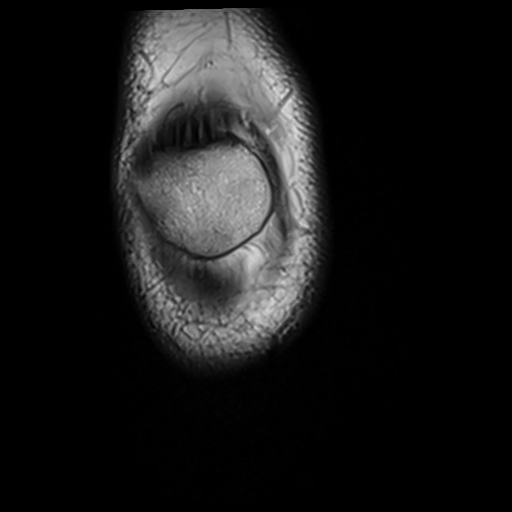
[im 10/25]
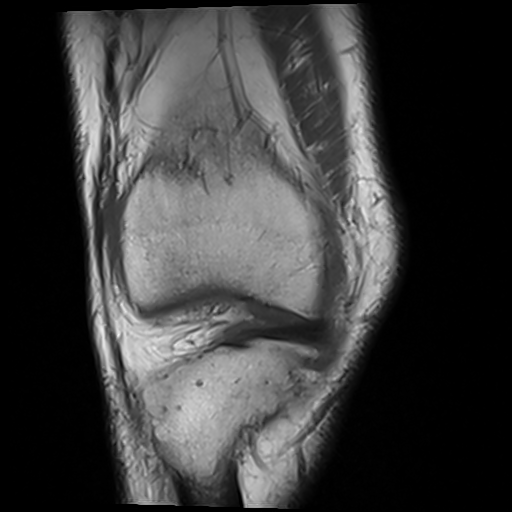
[im 15/25]
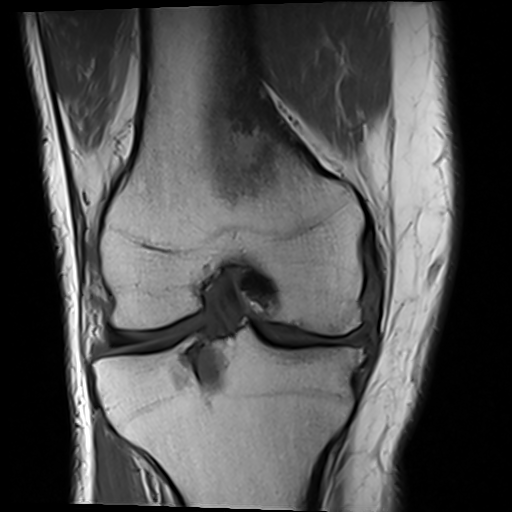
[im 20/25]
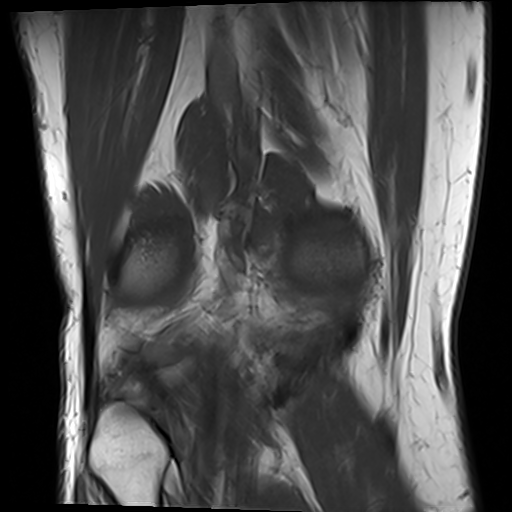

[Series 7: T2 fat-sat · coronal · right · 4.0mm · 0.59mm/px · 7 of 25 slices shown (2 of 3)]
[im 1/25]
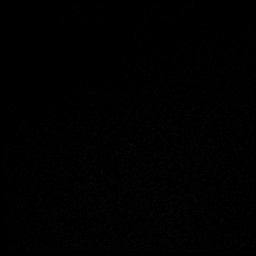
[im 5/25]
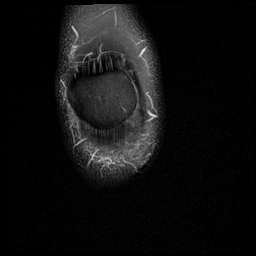
[im 9/25]
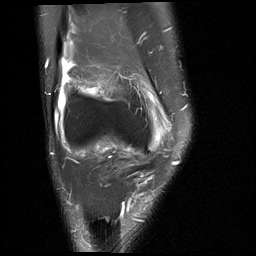
[im 13/25]
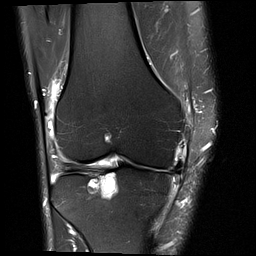
[im 17/25]
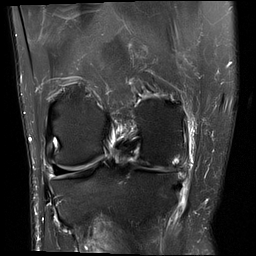
[im 21/25]
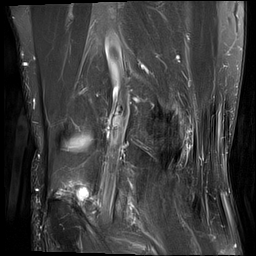
[im 25/25]
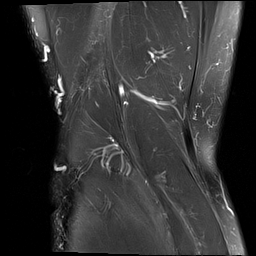

[Series 8: PD fat-sat · coronal · right · 4.0mm · 0.47mm/px · 7 of 25 slices shown (1 of 2)]
[im 1/25]
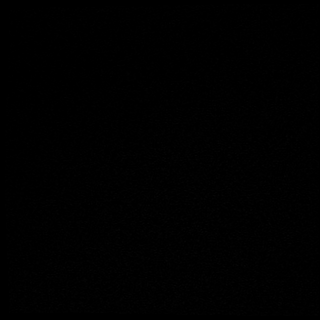
[im 5/25]
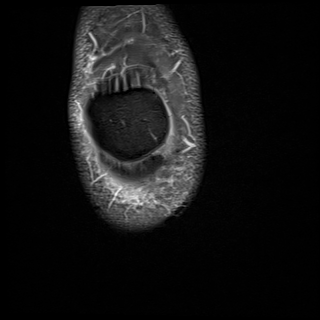
[im 9/25]
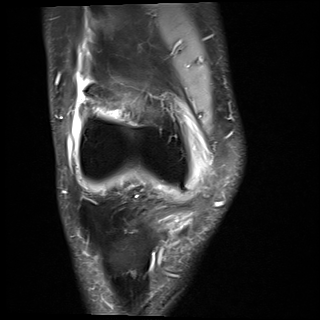
[im 13/25]
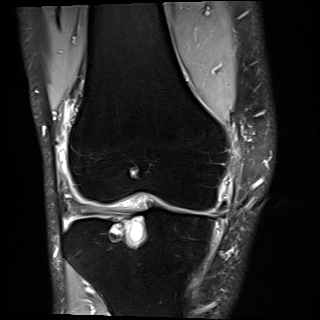
[im 17/25]
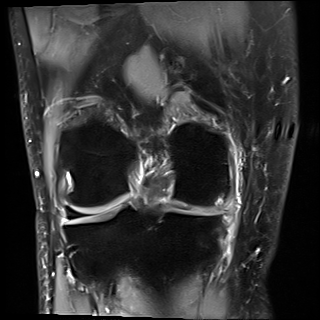
[im 21/25]
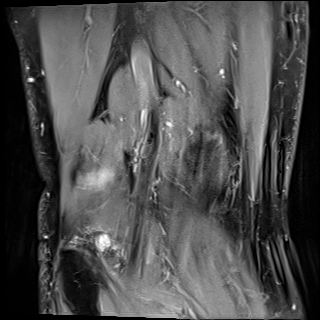
[im 25/25]
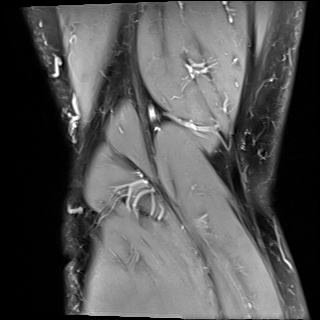

[Series 9: PD fat-sat · sagittal · right · 4.0mm · 0.47mm/px · 6 of 22 slices shown (2 of 2)]
[im 1/22]
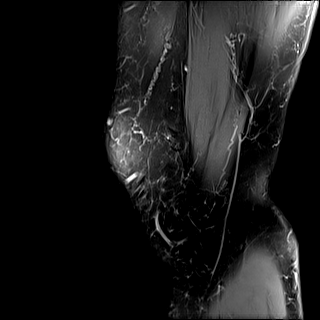
[im 5/22]
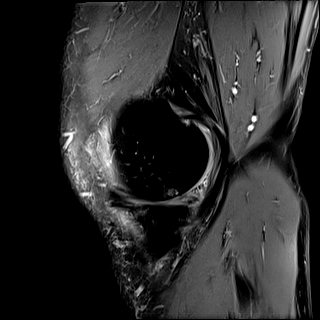
[im 9/22]
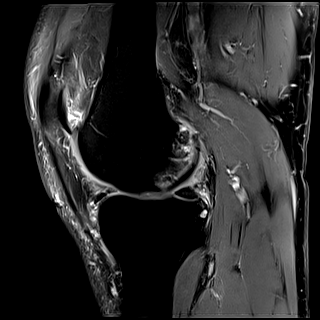
[im 13/22]
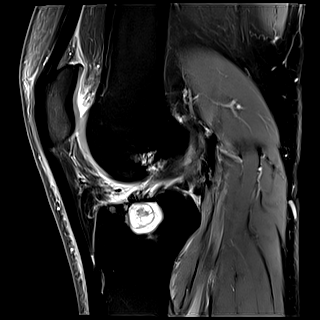
[im 17/22]
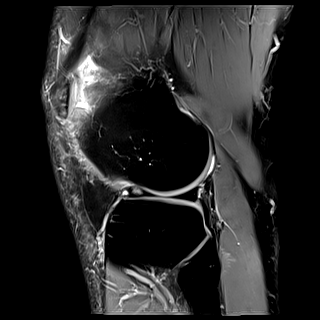
[im 22/22]
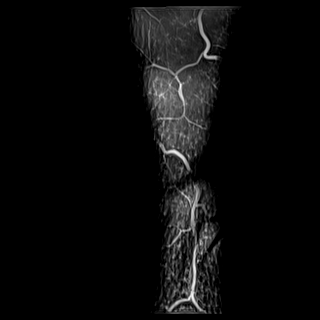

[Series 10: T2 fat-sat · sagittal · right · 4.0mm · 0.47mm/px · 6 of 22 slices shown (3 of 3)]
[im 1/22]
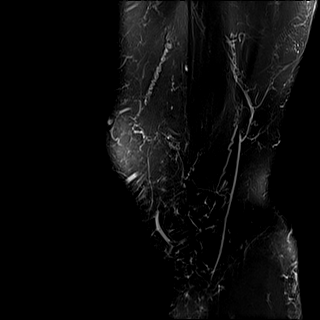
[im 5/22]
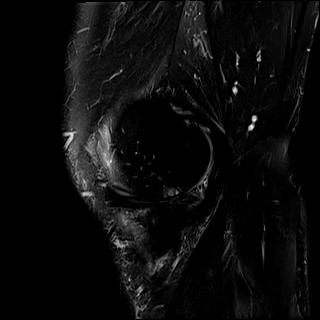
[im 9/22]
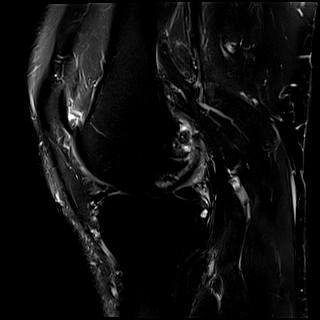
[im 13/22]
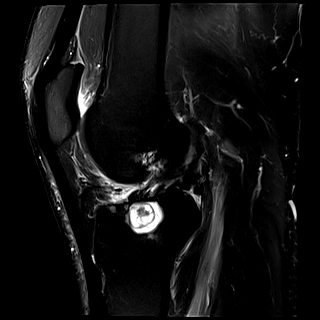
[im 17/22]
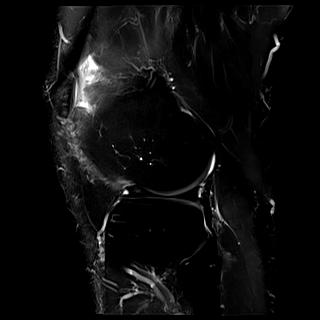
[im 22/22]
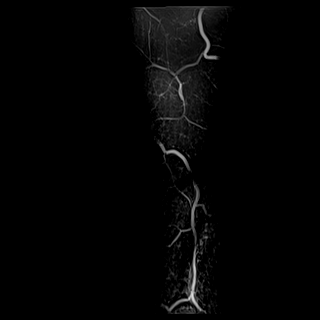

[39 of 40 positions shown; findings below may reference images not displayed]

FINDINGS: MENISCI

Medial meniscus: Considerable degenerative tearing of the midbody
and posterior horn with diminution in size of the midbody, free edge
irregularity of the posterior horn, and accentuated signal
throughout the involved segment of meniscal tissue.

Lateral meniscus: Degenerative tear of the anterior horn extending
to the superior surface on images 15-17 of series 9.

LIGAMENTS

Cruciates:  Unremarkable

Collaterals: Edema tracks along the MCL, without thickening or
discontinuity of the MCL. Trace MCL bursitis.

CARTILAGE

Patellofemoral: Mild chondral thinning along the medial facet, with
a 2 mm degenerative subcortical cystic lesion along the medial facet
on image [DATE]. Mild chondral thinning centrally along the femoral
trochlear groove.

Medial: Prominent chondral thinning along portions of the medial
femoral condyle with otherwise moderate degenerative chondral
thinning. Marginal spurring. Degenerative subcortical cyst or
non-fragmented osteochondral lesion along the posteromedial portion
of the medial femoral condyle, image [DATE].

Lateral: 4 by 7 mm partial-thickness chondral defect posteriorly
along the lateral femoral condyle on image [DATE].

Joint:  Small knee joint effusion. Mildly thickened medial plica.

Popliteal Fossa:  Unremarkable

Extensor Mechanism:  Unremarkable

Bones:  1.9 by 1.4 by 1.8 cm geode below the tibial spine.

Other: No supplemental non-categorized findings.
IMPRESSION: 1. Considerable degenerative tearing of the midbody and posterior
horn medial meniscus.
2. Degenerative tear of the anterior horn lateral meniscus extending
to the superior surface.
3. Trace MCL bursitis.  Potential grade 1 MCL sprain.
4. Variable degree of degenerative chondral thinning, most severe in
the medial compartment.
5. Small focal partial-thickness chondral defect posteriorly along
the lateral femoral condyle.
6. Small knee joint effusion with mildly thickened medial plica.
7. 1.9 cm geode below the tibial spine.

## 2022-01-22 DIAGNOSIS — J3089 Other allergic rhinitis: Secondary | ICD-10-CM | POA: Diagnosis not present

## 2022-01-22 DIAGNOSIS — J301 Allergic rhinitis due to pollen: Secondary | ICD-10-CM | POA: Diagnosis not present

## 2022-01-22 DIAGNOSIS — J3081 Allergic rhinitis due to animal (cat) (dog) hair and dander: Secondary | ICD-10-CM | POA: Diagnosis not present

## 2022-02-06 DIAGNOSIS — J3089 Other allergic rhinitis: Secondary | ICD-10-CM | POA: Diagnosis not present

## 2022-02-06 DIAGNOSIS — J301 Allergic rhinitis due to pollen: Secondary | ICD-10-CM | POA: Diagnosis not present

## 2022-02-06 DIAGNOSIS — J3081 Allergic rhinitis due to animal (cat) (dog) hair and dander: Secondary | ICD-10-CM | POA: Diagnosis not present

## 2022-02-16 ENCOUNTER — Ambulatory Visit: Payer: Medicare HMO | Admitting: Orthopaedic Surgery

## 2022-02-16 ENCOUNTER — Ambulatory Visit (INDEPENDENT_AMBULATORY_CARE_PROVIDER_SITE_OTHER): Payer: Medicare HMO

## 2022-02-16 DIAGNOSIS — M25512 Pain in left shoulder: Secondary | ICD-10-CM

## 2022-02-16 DIAGNOSIS — G8929 Other chronic pain: Secondary | ICD-10-CM

## 2022-02-16 MED ORDER — METHYLPREDNISOLONE ACETATE 40 MG/ML IJ SUSP
40.0000 mg | INTRAMUSCULAR | Status: AC | PRN
  Administered 2022-02-16: 40 mg via INTRA_ARTICULAR

## 2022-02-16 MED ORDER — LIDOCAINE HCL 1 % IJ SOLN
3.0000 mL | INTRAMUSCULAR | Status: AC | PRN
  Administered 2022-02-16: 3 mL

## 2022-02-16 MED ORDER — BUPIVACAINE HCL 0.5 % IJ SOLN
3.0000 mL | INTRAMUSCULAR | Status: AC | PRN
  Administered 2022-02-16: 3 mL via INTRA_ARTICULAR

## 2022-02-16 NOTE — Progress Notes (Signed)
Office Visit Note   Patient: Juan Garrett           Date of Birth: 05-Feb-1951           MRN: [id number] Visit Date: 02/16/2022              Requested by: Wenda Low, MD [address] [place] [address] [place],  [place] [address] PCP: Wenda Low, MD   Assessment & Plan: Visit Diagnoses:  1. Chronic left shoulder pain     Plan: Impression is chronic left shoulder pain.  Nothing to suggest rotator cuff pathology.  I feel like its more along the lines of overuse.  Based on options we would like to try subacromial injection relative rest.  If symptoms persist could consider MRI in about 6 weeks.  Follow-Up Instructions: No follow-ups on file.   Orders:  Orders Placed This Encounter  Procedures   XR Shoulder Left   No orders of the defined types were placed in this encounter.     Procedures: Large Joint Inj: L subacromial bursa on 02/16/2022 8:34 AM Indications: pain Details: 22 G needle  Arthrogram: No  Medications: 3 mL lidocaine 1 %; 3 mL bupivacaine 0.5 %; 40 mg methylPREDNISolone acetate 40 MG/ML Outcome: tolerated well, no immediate complications Patient was prepped and draped in the usual sterile fashion.       Clinical Data: No additional findings.   Subjective: Chief Complaint  Patient presents with   Left Shoulder - Pain    HPI Juan Garrett is a very pleasant 71 year old gentleman who is well-known to me comes in for left shoulder pain of insidious onset.  Works as a Theme park manager for living.  Has pain with reaching out and up.  Denies any radicular symptoms.  Denies any night pain.  Feels achiness in his shoulder. Review of Systems  Constitutional: Negative.   All other systems reviewed and are negative.    Objective: Vital Signs: There were no vitals taken for this visit.  Physical Exam Vitals and nursing note reviewed.  Constitutional:      Appearance: He is well-developed.  Pulmonary:     Effort: Pulmonary effort is normal.   Abdominal:     Palpations: Abdomen is soft.  Skin:    General: Skin is warm.  Neurological:     Mental Status: He is alert and oriented to person, place, and time.  Psychiatric:        Behavior: Behavior normal.        Thought Content: Thought content normal.        Judgment: Judgment normal.     Ortho Exam Examination of left shoulder shows pain with impingement and O'Brien and crank test.  Cuff strength is normal to manual muscle testing.  Range of motion is preserved.  AC joint unremarkable.  Biceps tendon unremarkable. Specialty Comments:  No specialty comments available.  Imaging: No results found.   PMFS History: Patient Active Problem List   Diagnosis Date Noted   Effusion, right knee 10/28/2019   Acute medial meniscal tear, right, initial encounter 09/04/2019   Past Medical History:  Diagnosis Date   Allergy    seasonal allergies   Arthritis    bilateral knees   Cataract    not a surgical candidate at this time (07/06/2020)   GERD (gastroesophageal reflux disease)    on meds   Hypertension    on meds    Family History  Problem Relation Age of Onset   Colon polyps Neg  Hx    Colon cancer Neg Hx    Esophageal cancer Neg Hx    Stomach cancer Neg Hx    Rectal cancer Neg Hx     Past Surgical History:  Procedure Laterality Date   CARPAL TUNNEL RELEASE Bilateral 1997   CHONDROPLASTY Right 09/23/2019   Procedure: CHONDROPLASTY;  Surgeon: Leandrew Koyanagi, MD;  Location: [facility];  Service: Orthopedics;  Laterality: Right;   KNEE ARTHROSCOPY WITH MEDIAL MENISECTOMY Right 09/23/2019   Procedure: RIGHT KNEE ARTHROSCOPY WITH PARTIAL MEDIAL MENISCECTOMY;  Surgeon: Leandrew Koyanagi, MD;  Location: [facility];  Service: Orthopedics;  Laterality: Right;   KNEE SURGERY Left 1992   Social History   Occupational History   Not on file  Tobacco Use   Smoking status: Former   Smokeless tobacco: Never  Vaping Use   Vaping Use: Never used   Substance and Sexual Activity   Alcohol use: Not Currently   Drug use: Never   Sexual activity: Not on file

## 2022-02-19 DIAGNOSIS — J3089 Other allergic rhinitis: Secondary | ICD-10-CM | POA: Diagnosis not present

## 2022-02-19 DIAGNOSIS — J3081 Allergic rhinitis due to animal (cat) (dog) hair and dander: Secondary | ICD-10-CM | POA: Diagnosis not present

## 2022-02-19 DIAGNOSIS — J301 Allergic rhinitis due to pollen: Secondary | ICD-10-CM | POA: Diagnosis not present

## 2022-03-15 DIAGNOSIS — J3081 Allergic rhinitis due to animal (cat) (dog) hair and dander: Secondary | ICD-10-CM | POA: Diagnosis not present

## 2022-03-15 DIAGNOSIS — J301 Allergic rhinitis due to pollen: Secondary | ICD-10-CM | POA: Diagnosis not present

## 2022-03-15 DIAGNOSIS — J3089 Other allergic rhinitis: Secondary | ICD-10-CM | POA: Diagnosis not present

## 2022-03-23 DIAGNOSIS — J3089 Other allergic rhinitis: Secondary | ICD-10-CM | POA: Diagnosis not present

## 2022-03-23 DIAGNOSIS — J301 Allergic rhinitis due to pollen: Secondary | ICD-10-CM | POA: Diagnosis not present

## 2022-03-23 DIAGNOSIS — J3081 Allergic rhinitis due to animal (cat) (dog) hair and dander: Secondary | ICD-10-CM | POA: Diagnosis not present

## 2022-03-27 DIAGNOSIS — J3089 Other allergic rhinitis: Secondary | ICD-10-CM | POA: Diagnosis not present

## 2022-03-27 DIAGNOSIS — J3081 Allergic rhinitis due to animal (cat) (dog) hair and dander: Secondary | ICD-10-CM | POA: Diagnosis not present

## 2022-03-27 DIAGNOSIS — J301 Allergic rhinitis due to pollen: Secondary | ICD-10-CM | POA: Diagnosis not present

## 2022-04-12 DIAGNOSIS — J3081 Allergic rhinitis due to animal (cat) (dog) hair and dander: Secondary | ICD-10-CM | POA: Diagnosis not present

## 2022-04-12 DIAGNOSIS — J301 Allergic rhinitis due to pollen: Secondary | ICD-10-CM | POA: Diagnosis not present

## 2022-04-12 DIAGNOSIS — J3089 Other allergic rhinitis: Secondary | ICD-10-CM | POA: Diagnosis not present

## 2022-04-13 DIAGNOSIS — Z23 Encounter for immunization: Secondary | ICD-10-CM | POA: Diagnosis not present

## 2022-04-13 DIAGNOSIS — I10 Essential (primary) hypertension: Secondary | ICD-10-CM | POA: Diagnosis not present

## 2022-04-13 DIAGNOSIS — H8110 Benign paroxysmal vertigo, unspecified ear: Secondary | ICD-10-CM | POA: Diagnosis not present

## 2022-04-13 DIAGNOSIS — E785 Hyperlipidemia, unspecified: Secondary | ICD-10-CM | POA: Diagnosis not present

## 2022-04-13 DIAGNOSIS — Z1389 Encounter for screening for other disorder: Secondary | ICD-10-CM | POA: Diagnosis not present

## 2022-04-13 DIAGNOSIS — K219 Gastro-esophageal reflux disease without esophagitis: Secondary | ICD-10-CM | POA: Diagnosis not present

## 2022-04-13 DIAGNOSIS — J309 Allergic rhinitis, unspecified: Secondary | ICD-10-CM | POA: Diagnosis not present

## 2022-04-13 DIAGNOSIS — E559 Vitamin D deficiency, unspecified: Secondary | ICD-10-CM | POA: Diagnosis not present

## 2022-04-13 DIAGNOSIS — Z0000 Encounter for general adult medical examination without abnormal findings: Secondary | ICD-10-CM | POA: Diagnosis not present

## 2022-04-13 DIAGNOSIS — N182 Chronic kidney disease, stage 2 (mild): Secondary | ICD-10-CM | POA: Diagnosis not present

## 2022-04-13 DIAGNOSIS — N400 Enlarged prostate without lower urinary tract symptoms: Secondary | ICD-10-CM | POA: Diagnosis not present

## 2022-04-25 DIAGNOSIS — L570 Actinic keratosis: Secondary | ICD-10-CM | POA: Diagnosis not present

## 2022-04-25 DIAGNOSIS — D225 Melanocytic nevi of trunk: Secondary | ICD-10-CM | POA: Diagnosis not present

## 2022-04-25 DIAGNOSIS — L821 Other seborrheic keratosis: Secondary | ICD-10-CM | POA: Diagnosis not present

## 2022-04-25 DIAGNOSIS — L814 Other melanin hyperpigmentation: Secondary | ICD-10-CM | POA: Diagnosis not present

## 2022-04-25 DIAGNOSIS — D485 Neoplasm of uncertain behavior of skin: Secondary | ICD-10-CM | POA: Diagnosis not present

## 2022-04-25 DIAGNOSIS — L280 Lichen simplex chronicus: Secondary | ICD-10-CM | POA: Diagnosis not present

## 2022-04-25 DIAGNOSIS — C44519 Basal cell carcinoma of skin of other part of trunk: Secondary | ICD-10-CM | POA: Diagnosis not present

## 2022-05-08 DIAGNOSIS — J3081 Allergic rhinitis due to animal (cat) (dog) hair and dander: Secondary | ICD-10-CM | POA: Diagnosis not present

## 2022-05-08 DIAGNOSIS — J301 Allergic rhinitis due to pollen: Secondary | ICD-10-CM | POA: Diagnosis not present

## 2022-05-08 DIAGNOSIS — J3089 Other allergic rhinitis: Secondary | ICD-10-CM | POA: Diagnosis not present

## 2022-05-22 DIAGNOSIS — J3081 Allergic rhinitis due to animal (cat) (dog) hair and dander: Secondary | ICD-10-CM | POA: Diagnosis not present

## 2022-05-22 DIAGNOSIS — J301 Allergic rhinitis due to pollen: Secondary | ICD-10-CM | POA: Diagnosis not present

## 2022-05-22 DIAGNOSIS — J3089 Other allergic rhinitis: Secondary | ICD-10-CM | POA: Diagnosis not present

## 2022-06-20 DIAGNOSIS — C44519 Basal cell carcinoma of skin of other part of trunk: Secondary | ICD-10-CM | POA: Diagnosis not present

## 2022-07-03 DIAGNOSIS — J3081 Allergic rhinitis due to animal (cat) (dog) hair and dander: Secondary | ICD-10-CM | POA: Diagnosis not present

## 2022-07-03 DIAGNOSIS — J301 Allergic rhinitis due to pollen: Secondary | ICD-10-CM | POA: Diagnosis not present

## 2022-07-03 DIAGNOSIS — J3089 Other allergic rhinitis: Secondary | ICD-10-CM | POA: Diagnosis not present

## 2022-07-23 DIAGNOSIS — J3089 Other allergic rhinitis: Secondary | ICD-10-CM | POA: Diagnosis not present

## 2022-07-23 DIAGNOSIS — J3081 Allergic rhinitis due to animal (cat) (dog) hair and dander: Secondary | ICD-10-CM | POA: Diagnosis not present

## 2022-07-23 DIAGNOSIS — J301 Allergic rhinitis due to pollen: Secondary | ICD-10-CM | POA: Diagnosis not present

## 2022-08-01 ENCOUNTER — Encounter: Payer: Self-pay | Admitting: Orthopaedic Surgery

## 2022-08-01 ENCOUNTER — Ambulatory Visit: Payer: Medicare Other | Admitting: Orthopaedic Surgery

## 2022-08-01 DIAGNOSIS — G8929 Other chronic pain: Secondary | ICD-10-CM

## 2022-08-01 DIAGNOSIS — M25512 Pain in left shoulder: Secondary | ICD-10-CM | POA: Diagnosis not present

## 2022-08-01 NOTE — Progress Notes (Signed)
Office Visit Note   Patient: Juan Garrett           Date of Birth: 08-20-1950           MRN: [id number] Visit Date: 08/01/2022              Requested by: Wenda Low, MD [address] [place] [address] [place],  [place] [address] PCP: Wenda Low, MD   Assessment & Plan: Visit Diagnoses:  1. Chronic left shoulder pain     Plan: Impression is chronic left shoulder pain with minimal to no improvement following subacromial cortisone injection as well as a guided home exercise program for the past several months.  At this point, the patient's symptoms have continued to persist and actually have worsened.  We will go ahead and get an MRI of the left shoulder to assess for structural abnormalities.  He will follow-up with [place] once completed.  Follow-Up Instructions: Return for f/u after mri.   Orders:  Orders Placed This Encounter  Procedures   MR Shoulder Left w/o contrast   No orders of the defined types were placed in this encounter.     Procedures: No procedures performed   Clinical Data: No additional findings.   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  He was seen in our office this past fall with insidious onset of pain.  Symptoms improved a little after undergoing subacromial cortisone injection back in September.  He has been working on a home exercise program since without relief.  His symptoms have gradually returned and have become worse than they were prior to his visit with [place] in September.  The pain he has occurs when he is moving his shoulder in certain directions such as with forward flexion and internal rotation.  He does feel as though he loses some strength with his arm forward flex past 90 degrees.  He is taking ibuprofen as needed for pain.  Review of Systems as detailed in HPI.  All others  reviewed and are negative.   Objective: Vital Signs: There were no vitals taken for this visit.  Physical Exam well-developed well-nourished gentleman in no acute distress.  Alert and oriented x 3.  Ortho Exam unchanged right shoulder exam  Specialty Comments:  No specialty comments available.  Imaging: No new imaging   PMFS History: Patient Active Problem List   Diagnosis Date Noted   Effusion, right knee 10/28/2019   Acute medial meniscal tear, right, initial encounter 09/04/2019   Past Medical History:  Diagnosis Date   Allergy    seasonal allergies   Arthritis    bilateral knees   Cataract    not a surgical candidate at this time (07/06/2020)   GERD (gastroesophageal reflux disease)    on meds   Hypertension    on meds    Family History  Problem Relation Age of Onset   Colon polyps Neg Hx    Colon cancer Neg Hx    Esophageal cancer Neg Hx    Stomach cancer Neg Hx    Rectal cancer Neg Hx     Past Surgical History:  Procedure Laterality Date   CARPAL TUNNEL RELEASE Bilateral 1997   CHONDROPLASTY Right 09/23/2019   Procedure: CHONDROPLASTY;  Surgeon: Leandrew Koyanagi, MD;  Location: [facility];  Service: Orthopedics;  Laterality: Right;  KNEE ARTHROSCOPY WITH MEDIAL MENISECTOMY Right 09/23/2019   Procedure: RIGHT KNEE ARTHROSCOPY WITH PARTIAL MEDIAL MENISCECTOMY;  Surgeon: Leandrew Koyanagi, MD;  Location: [facility];  Service: Orthopedics;  Laterality: Right;   KNEE SURGERY Left 1992   Social History   Occupational History   Not on file  Tobacco Use   Smoking status: Former   Smokeless tobacco: Never  Vaping Use   Vaping Use: Never used  Substance and Sexual Activity   Alcohol use: Not Currently   Drug use: Never   Sexual activity: Not on file

## 2022-08-06 DIAGNOSIS — J3089 Other allergic rhinitis: Secondary | ICD-10-CM | POA: Diagnosis not present

## 2022-08-06 DIAGNOSIS — J3081 Allergic rhinitis due to animal (cat) (dog) hair and dander: Secondary | ICD-10-CM | POA: Diagnosis not present

## 2022-08-06 DIAGNOSIS — J301 Allergic rhinitis due to pollen: Secondary | ICD-10-CM | POA: Diagnosis not present

## 2022-08-14 DIAGNOSIS — J301 Allergic rhinitis due to pollen: Secondary | ICD-10-CM | POA: Diagnosis not present

## 2022-08-14 DIAGNOSIS — J3089 Other allergic rhinitis: Secondary | ICD-10-CM | POA: Diagnosis not present

## 2022-08-14 DIAGNOSIS — J3081 Allergic rhinitis due to animal (cat) (dog) hair and dander: Secondary | ICD-10-CM | POA: Diagnosis not present

## 2022-08-17 ENCOUNTER — Ambulatory Visit
Admission: RE | Admit: 2022-08-17 | Discharge: 2022-08-17 | Disposition: A | Discharge status: Home / Self Care | Payer: Medicare Other | Source: Ambulatory Visit | Attending: Orthopaedic Surgery | Admitting: Orthopaedic Surgery

## 2022-08-17 DIAGNOSIS — G8929 Other chronic pain: Secondary | ICD-10-CM

## 2022-08-17 DIAGNOSIS — M19012 Primary osteoarthritis, left shoulder: Secondary | ICD-10-CM | POA: Diagnosis not present

## 2022-08-18 NOTE — Progress Notes (Signed)
Need f/u appt.  Thanks.

## 2022-08-23 DIAGNOSIS — J3089 Other allergic rhinitis: Secondary | ICD-10-CM | POA: Diagnosis not present

## 2022-08-23 DIAGNOSIS — J3081 Allergic rhinitis due to animal (cat) (dog) hair and dander: Secondary | ICD-10-CM | POA: Diagnosis not present

## 2022-08-23 DIAGNOSIS — J301 Allergic rhinitis due to pollen: Secondary | ICD-10-CM | POA: Diagnosis not present

## 2022-08-29 ENCOUNTER — Ambulatory Visit: Payer: Medicare Other | Admitting: Orthopaedic Surgery

## 2022-08-29 ENCOUNTER — Encounter: Payer: Self-pay | Admitting: Orthopaedic Surgery

## 2022-08-29 DIAGNOSIS — G8929 Other chronic pain: Secondary | ICD-10-CM | POA: Diagnosis not present

## 2022-08-29 DIAGNOSIS — M25512 Pain in left shoulder: Secondary | ICD-10-CM | POA: Diagnosis not present

## 2022-08-29 NOTE — Progress Notes (Signed)
Office Visit Note   Patient: Juan Garrett           Date of Birth: 1950/10/01           MRN: [id number] Visit Date: 08/29/2022              Requested by: Wenda Low, MD [address] [place],  [place] [address] PCP: Wenda Low, MD   Assessment & Plan: Visit Diagnoses:  1. Chronic left shoulder pain     Plan: MRI of the left shoulder independently reviewed and interpreted shows tendinopathy of the supraspinatus and infraspinatus with low-grade partial thickness tear.  Biceps tendinopathy and degenerative superior and posterior and anterior labral tears.  He has a fair amount of AC joint arthritis.  These findings were reviewed with the patient in detail and treatment options were discussed.  He will think about his options and get back in touch with [place].  Follow-up as needed.  Follow-Up Instructions: No follow-ups on file.   Orders:  No orders of the defined types were placed in this encounter.  No orders of the defined types were placed in this encounter.     Procedures: No procedures performed   Clinical Data: No additional findings.   Subjective: Chief Complaint  Patient presents with   Left Shoulder - Follow-up    MRI review    HPI  Mr. Speier returns today to discuss left shoulder MRI scan.  Review of Systems   Objective: Vital Signs: There were no vitals taken for this visit.  Physical Exam  Ortho Exam  Examination of the left shoulder shows good strength with manual muscle testing of the rotator cuff.  Pain with impingement and cross body adduction.  AC joint is slightly tender.  Specialty Comments:  No specialty comments available.  Imaging: No results found.   PMFS History: Patient Active Problem List   Diagnosis Date Noted   Effusion, right knee 10/28/2019   Acute medial meniscal tear, right, initial encounter 09/04/2019   Past Medical History:  Diagnosis Date   Allergy    seasonal allergies   Arthritis     bilateral knees   Cataract    not a surgical candidate at this time (07/06/2020)   GERD (gastroesophageal reflux disease)    on meds   Hypertension    on meds    Family History  Problem Relation Age of Onset   Colon polyps Neg Hx    Colon cancer Neg Hx    Esophageal cancer Neg Hx    Stomach cancer Neg Hx    Rectal cancer Neg Hx     Past Surgical History:  Procedure Laterality Date   CARPAL TUNNEL RELEASE Bilateral 1997   CHONDROPLASTY Right 09/23/2019   Procedure: CHONDROPLASTY;  Surgeon: Leandrew Koyanagi, MD;  Location: [facility];  Service: Orthopedics;  Laterality: Right;   KNEE ARTHROSCOPY WITH MEDIAL MENISECTOMY Right 09/23/2019   Procedure: RIGHT KNEE ARTHROSCOPY WITH PARTIAL MEDIAL MENISCECTOMY;  Surgeon: Leandrew Koyanagi, MD;  Location: [facility];  Service: Orthopedics;  Laterality: Right;   KNEE SURGERY Left 1992   Social History   Occupational History   Not on file  Tobacco Use   Smoking status: Former   Smokeless tobacco: Never  Vaping Use   Vaping Use: Never used  Substance and Sexual Activity   Alcohol use: Not Currently   Drug use: Never   Sexual activity: Not on file

## 2022-09-19 DIAGNOSIS — D485 Neoplasm of uncertain behavior of skin: Secondary | ICD-10-CM | POA: Diagnosis not present

## 2022-09-19 DIAGNOSIS — Z85828 Personal history of other malignant neoplasm of skin: Secondary | ICD-10-CM | POA: Diagnosis not present

## 2022-09-19 DIAGNOSIS — L814 Other melanin hyperpigmentation: Secondary | ICD-10-CM | POA: Diagnosis not present

## 2022-09-19 DIAGNOSIS — D225 Melanocytic nevi of trunk: Secondary | ICD-10-CM | POA: Diagnosis not present

## 2022-09-19 DIAGNOSIS — C44729 Squamous cell carcinoma of skin of left lower limb, including hip: Secondary | ICD-10-CM | POA: Diagnosis not present

## 2022-09-19 DIAGNOSIS — Z08 Encounter for follow-up examination after completed treatment for malignant neoplasm: Secondary | ICD-10-CM | POA: Diagnosis not present

## 2022-09-20 DIAGNOSIS — J3089 Other allergic rhinitis: Secondary | ICD-10-CM | POA: Diagnosis not present

## 2022-09-20 DIAGNOSIS — J3081 Allergic rhinitis due to animal (cat) (dog) hair and dander: Secondary | ICD-10-CM | POA: Diagnosis not present

## 2022-09-20 DIAGNOSIS — J301 Allergic rhinitis due to pollen: Secondary | ICD-10-CM | POA: Diagnosis not present

## 2022-10-17 DIAGNOSIS — I10 Essential (primary) hypertension: Secondary | ICD-10-CM | POA: Diagnosis not present

## 2022-10-17 DIAGNOSIS — K219 Gastro-esophageal reflux disease without esophagitis: Secondary | ICD-10-CM | POA: Diagnosis not present

## 2022-10-17 DIAGNOSIS — E559 Vitamin D deficiency, unspecified: Secondary | ICD-10-CM | POA: Diagnosis not present

## 2022-10-17 DIAGNOSIS — E785 Hyperlipidemia, unspecified: Secondary | ICD-10-CM | POA: Diagnosis not present

## 2022-10-18 DIAGNOSIS — J3089 Other allergic rhinitis: Secondary | ICD-10-CM | POA: Diagnosis not present

## 2022-10-18 DIAGNOSIS — J3081 Allergic rhinitis due to animal (cat) (dog) hair and dander: Secondary | ICD-10-CM | POA: Diagnosis not present

## 2022-10-18 DIAGNOSIS — J301 Allergic rhinitis due to pollen: Secondary | ICD-10-CM | POA: Diagnosis not present

## 2022-10-19 DIAGNOSIS — C44321 Squamous cell carcinoma of skin of nose: Secondary | ICD-10-CM | POA: Diagnosis not present

## 2022-11-07 DIAGNOSIS — J3089 Other allergic rhinitis: Secondary | ICD-10-CM | POA: Diagnosis not present

## 2022-11-07 DIAGNOSIS — J3081 Allergic rhinitis due to animal (cat) (dog) hair and dander: Secondary | ICD-10-CM | POA: Diagnosis not present

## 2022-11-07 DIAGNOSIS — J301 Allergic rhinitis due to pollen: Secondary | ICD-10-CM | POA: Diagnosis not present

## 2022-11-12 DIAGNOSIS — J3081 Allergic rhinitis due to animal (cat) (dog) hair and dander: Secondary | ICD-10-CM | POA: Diagnosis not present

## 2022-11-12 DIAGNOSIS — J301 Allergic rhinitis due to pollen: Secondary | ICD-10-CM | POA: Diagnosis not present

## 2022-11-12 DIAGNOSIS — J3089 Other allergic rhinitis: Secondary | ICD-10-CM | POA: Diagnosis not present

## 2022-12-05 ENCOUNTER — Other Ambulatory Visit (INDEPENDENT_AMBULATORY_CARE_PROVIDER_SITE_OTHER): Payer: Medicare Other

## 2022-12-05 ENCOUNTER — Ambulatory Visit: Payer: Medicare Other | Admitting: Family

## 2022-12-05 ENCOUNTER — Encounter: Payer: Self-pay | Admitting: Family

## 2022-12-05 DIAGNOSIS — M7671 Peroneal tendinitis, right leg: Secondary | ICD-10-CM | POA: Diagnosis not present

## 2022-12-05 DIAGNOSIS — M79671 Pain in right foot: Secondary | ICD-10-CM

## 2022-12-05 DIAGNOSIS — M25872 Other specified joint disorders, left ankle and foot: Secondary | ICD-10-CM | POA: Diagnosis not present

## 2022-12-05 MED ORDER — LIDOCAINE HCL 1 % IJ SOLN
2.0000 mL | INTRAMUSCULAR | Status: AC | PRN
  Administered 2022-12-05: 2 mL

## 2022-12-05 MED ORDER — PREDNISONE 10 MG PO TABS: 10.0000 mg | ORAL_TABLET | Freq: Every day | ORAL | 0 refills | Status: DC

## 2022-12-05 MED ORDER — METHYLPREDNISOLONE ACETATE 40 MG/ML IJ SUSP
40.0000 mg | INTRAMUSCULAR | Status: AC | PRN
Start: 2022-12-05 — End: 2022-12-05
  Administered 2022-12-05: 40 mg via INTRA_ARTICULAR

## 2022-12-05 NOTE — Progress Notes (Signed)
Office Visit Note   Patient: Juan Garrett           Date of Birth: March 15, 1951           MRN: [id number] Visit Date: 12/05/2022              Requested by: Georgann Housekeeper, MD [address] [place],  [place] [address] PCP: Georgann Housekeeper, MD  Chief Complaint  Patient presents with   Left Ankle - Pain    Requesting cortisone injection      HPI: The patient is a 72 year old gentleman who presents today complaining of a many year history of left ankle pain primarily over the anterior joint line he is having some radiation up to the mid shin pain especially with pivoting of the ankle.  As long as he stands still his symptoms are less has been gradually worsening.  He has had 1 Depo-Medrol injection for impingement in the left ankle without much relief.  Today would like to consider repeat injection.  Last injection was in October 2022  He stands on his feet for prolonged hours as he works as a Paediatric nurse.  He does have Hoka's he did feel some relief with his first pair of Hoka's however the last pair he has been in for the last 5 to 6 months have not been as comfortable.  He has tried different orthotic inserts including sole orthotics in the past  Assessment & Plan: Visit Diagnoses:  1. Peroneal tendonitis, right   2. Pain in right foot   3. Impingement syndrome of left ankle     Plan: Depo-Medrol injection left ankle patient tolerated well.  Discussed orthotics versus new Hoka's for arch support.  Discussed chronic management anti-inflammatories.  Will provide short course of prednisone orally.  Follow-Up Instructions: No follow-ups on file.   Ortho Exam  Patient is alert, oriented, no adenopathy, well-dressed, normal affect, normal respiratory effort. On examination of the left ankle he does have pain anteriorly over the ankle joint especially over the sinus Tarsi.  Significant varus collapse of the left knee secondary osteoarthritis.  She on examination of the  right foot he is having some tenderness to palpation of the insertion of the peroneal tendon with pain with resisted in which is version.  Imaging: No results found. No images are attached to the encounter.  Labs: No results found for: "HGBA1C", "ESRSEDRATE", "CRP", "LABURIC", "REPTSTATUS", "GRAMSTAIN", "CULT", "LABORGA"   No results found for: "ALBUMIN", "PREALBUMIN", "CBC"  No results found for: "MG" No results found for: "VD25OH"  No results found for: "PREALBUMIN"     No data to display           There is no height or weight on file to calculate BMI.  Orders:  Orders Placed This Encounter  Procedures   XR Foot Complete Right   No orders of the defined types were placed in this encounter.    Procedures: Medium Joint Inj: L ankle on 12/05/2022 2:16 PM Indications: pain Details: 25 G needle, anterolateral approach Medications: 2 mL lidocaine 1 %; 40 mg methylPREDNISolone acetate 40 MG/ML Outcome: tolerated well, no immediate complications Consent was given by the patient. Patient was prepped and draped in the usual sterile fashion.      Clinical Data: No additional findings.  ROS:  All other systems negative, except as noted in the HPI. Review of Systems  Musculoskeletal:  Positive for arthralgias, gait problem and myalgias.    Objective: Vital Signs: There  were no vitals taken for this visit.  Specialty Comments:  No specialty comments available.  PMFS History: Patient Active Problem List   Diagnosis Date Noted   Effusion, right knee 10/28/2019   Acute medial meniscal tear, right, initial encounter 09/04/2019   Past Medical History:  Diagnosis Date   Allergy    seasonal allergies   Arthritis    bilateral knees   Cataract    not a surgical candidate at this time (07/06/2020)   GERD (gastroesophageal reflux disease)    on meds   Hypertension    on meds    Family History  Problem Relation Age of Onset   Colon polyps Neg Hx    Colon  cancer Neg Hx    Esophageal cancer Neg Hx    Stomach cancer Neg Hx    Rectal cancer Neg Hx     Past Surgical History:  Procedure Laterality Date   CARPAL TUNNEL RELEASE Bilateral 1997   CHONDROPLASTY Right 09/23/2019   Procedure: CHONDROPLASTY;  Surgeon: Tarry Kos, MD;  Location: [facility];  Service: Orthopedics;  Laterality: Right;   KNEE ARTHROSCOPY WITH MEDIAL MENISECTOMY Right 09/23/2019   Procedure: RIGHT KNEE ARTHROSCOPY WITH PARTIAL MEDIAL MENISCECTOMY;  Surgeon: Tarry Kos, MD;  Location: [facility];  Service: Orthopedics;  Laterality: Right;   KNEE SURGERY Left 1992   Social History   Occupational History   Not on file  Tobacco Use   Smoking status: Former   Smokeless tobacco: Never  Vaping Use   Vaping Use: Never used  Substance and Sexual Activity   Alcohol use: Not Currently   Drug use: Never   Sexual activity: Not on file

## 2022-12-10 DIAGNOSIS — J301 Allergic rhinitis due to pollen: Secondary | ICD-10-CM | POA: Diagnosis not present

## 2022-12-10 DIAGNOSIS — J3089 Other allergic rhinitis: Secondary | ICD-10-CM | POA: Diagnosis not present

## 2022-12-10 DIAGNOSIS — J3081 Allergic rhinitis due to animal (cat) (dog) hair and dander: Secondary | ICD-10-CM | POA: Diagnosis not present

## 2023-01-07 DIAGNOSIS — J3089 Other allergic rhinitis: Secondary | ICD-10-CM | POA: Diagnosis not present

## 2023-01-07 DIAGNOSIS — J3081 Allergic rhinitis due to animal (cat) (dog) hair and dander: Secondary | ICD-10-CM | POA: Diagnosis not present

## 2023-01-07 DIAGNOSIS — J301 Allergic rhinitis due to pollen: Secondary | ICD-10-CM | POA: Diagnosis not present

## 2023-01-22 ENCOUNTER — Other Ambulatory Visit: Payer: Self-pay | Admitting: Family

## 2023-01-22 MED ORDER — PREDNISONE 10 MG PO TABS: 10.0000 mg | ORAL_TABLET | Freq: Every day | ORAL | 0 refills | Status: DC

## 2023-01-25 ENCOUNTER — Other Ambulatory Visit: Payer: Self-pay | Admitting: Internal Medicine

## 2023-01-25 DIAGNOSIS — R5383 Other fatigue: Secondary | ICD-10-CM | POA: Diagnosis not present

## 2023-01-25 DIAGNOSIS — Z8659 Personal history of other mental and behavioral disorders: Secondary | ICD-10-CM

## 2023-01-25 DIAGNOSIS — G479 Sleep disorder, unspecified: Secondary | ICD-10-CM | POA: Diagnosis not present

## 2023-01-25 DIAGNOSIS — R519 Headache, unspecified: Secondary | ICD-10-CM

## 2023-01-25 DIAGNOSIS — R4182 Altered mental status, unspecified: Secondary | ICD-10-CM

## 2023-01-30 ENCOUNTER — Ambulatory Visit
Admission: RE | Admit: 2023-01-30 | Discharge: 2023-01-30 | Disposition: A | Discharge status: Home / Self Care | Payer: Medicare Other | Source: Ambulatory Visit | Attending: Internal Medicine | Admitting: Internal Medicine

## 2023-01-30 ENCOUNTER — Other Ambulatory Visit: Payer: Self-pay | Admitting: Neurosurgery

## 2023-01-30 ENCOUNTER — Inpatient Hospital Stay (HOSPITAL_COMMUNITY)
Admission: EM | Admit: 2023-01-30 | Discharge: 2023-02-02 | DRG: 025 | Disposition: A | Discharge status: Home / Self Care | Payer: Medicare Other | Attending: Neurological Surgery | Admitting: Neurological Surgery

## 2023-01-30 ENCOUNTER — Other Ambulatory Visit: Payer: Self-pay

## 2023-01-30 ENCOUNTER — Encounter (HOSPITAL_COMMUNITY): Payer: Self-pay

## 2023-01-30 ENCOUNTER — Emergency Department (HOSPITAL_COMMUNITY): Payer: Medicare Other

## 2023-01-30 ENCOUNTER — Other Ambulatory Visit: Payer: Medicare Other

## 2023-01-30 DIAGNOSIS — N182 Chronic kidney disease, stage 2 (mild): Secondary | ICD-10-CM | POA: Diagnosis present

## 2023-01-30 DIAGNOSIS — R519 Headache, unspecified: Secondary | ICD-10-CM

## 2023-01-30 DIAGNOSIS — M79672 Pain in left foot: Secondary | ICD-10-CM | POA: Diagnosis present

## 2023-01-30 DIAGNOSIS — C719 Malignant neoplasm of brain, unspecified: Secondary | ICD-10-CM | POA: Diagnosis not present

## 2023-01-30 DIAGNOSIS — Z886 Allergy status to analgesic agent status: Secondary | ICD-10-CM | POA: Diagnosis not present

## 2023-01-30 DIAGNOSIS — R4182 Altered mental status, unspecified: Secondary | ICD-10-CM

## 2023-01-30 DIAGNOSIS — E785 Hyperlipidemia, unspecified: Secondary | ICD-10-CM | POA: Diagnosis present

## 2023-01-30 DIAGNOSIS — G9389 Other specified disorders of brain: Secondary | ICD-10-CM | POA: Diagnosis not present

## 2023-01-30 DIAGNOSIS — C712 Malignant neoplasm of temporal lobe: Principal | ICD-10-CM | POA: Diagnosis present

## 2023-01-30 DIAGNOSIS — G935 Compression of brain: Secondary | ICD-10-CM | POA: Diagnosis present

## 2023-01-30 DIAGNOSIS — M25512 Pain in left shoulder: Secondary | ICD-10-CM | POA: Diagnosis not present

## 2023-01-30 DIAGNOSIS — I129 Hypertensive chronic kidney disease with stage 1 through stage 4 chronic kidney disease, or unspecified chronic kidney disease: Secondary | ICD-10-CM | POA: Diagnosis present

## 2023-01-30 DIAGNOSIS — R42 Dizziness and giddiness: Secondary | ICD-10-CM | POA: Diagnosis not present

## 2023-01-30 DIAGNOSIS — R29810 Facial weakness: Secondary | ICD-10-CM | POA: Diagnosis present

## 2023-01-30 DIAGNOSIS — D496 Neoplasm of unspecified behavior of brain: Secondary | ICD-10-CM | POA: Diagnosis not present

## 2023-01-30 DIAGNOSIS — Z79899 Other long term (current) drug therapy: Secondary | ICD-10-CM

## 2023-01-30 DIAGNOSIS — N400 Enlarged prostate without lower urinary tract symptoms: Secondary | ICD-10-CM | POA: Diagnosis not present

## 2023-01-30 DIAGNOSIS — Z87891 Personal history of nicotine dependence: Secondary | ICD-10-CM

## 2023-01-30 DIAGNOSIS — G939 Disorder of brain, unspecified: Secondary | ICD-10-CM | POA: Diagnosis not present

## 2023-01-30 DIAGNOSIS — G930 Cerebral cysts: Secondary | ICD-10-CM | POA: Diagnosis not present

## 2023-01-30 DIAGNOSIS — I10 Essential (primary) hypertension: Secondary | ICD-10-CM | POA: Diagnosis not present

## 2023-01-30 DIAGNOSIS — Z85828 Personal history of other malignant neoplasm of skin: Secondary | ICD-10-CM | POA: Diagnosis not present

## 2023-01-30 DIAGNOSIS — M170 Bilateral primary osteoarthritis of knee: Secondary | ICD-10-CM | POA: Diagnosis not present

## 2023-01-30 DIAGNOSIS — K219 Gastro-esophageal reflux disease without esophagitis: Secondary | ICD-10-CM | POA: Diagnosis present

## 2023-01-30 DIAGNOSIS — D430 Neoplasm of uncertain behavior of brain, supratentorial: Secondary | ICD-10-CM | POA: Diagnosis not present

## 2023-01-30 LAB — COMPREHENSIVE METABOLIC PANEL
ALT: 22 U/L (ref 0–44)
AST: 19 U/L (ref 15–41)
Albumin: 3.8 g/dL (ref 3.5–5.0)
Alkaline Phosphatase: 50 U/L (ref 38–126)
Anion gap: 15 (ref 5–15)
BUN: 22 mg/dL (ref 8–23)
CO2: 23 mmol/L (ref 22–32)
Calcium: 9.6 mg/dL (ref 8.9–10.3)
Chloride: 103 mmol/L (ref 98–111)
Creatinine, Ser: 1.12 mg/dL (ref 0.61–1.24)
GFR, Estimated: >60 mL/min (ref >60)
Glucose, Bld: 111 mg/dL — ABNORMAL HIGH (ref 70–99)
Potassium: 3.9 mmol/L (ref 3.5–5.1)
Sodium: 141 mmol/L (ref 135–145)
Total Bilirubin: 0.6 mg/dL (ref 0.3–1.2)
Total Protein: 6.8 g/dL (ref 6.5–8.1)

## 2023-01-30 LAB — CBC
HCT: 44.2 % (ref 39.0–52.0)
Hemoglobin: 15 g/dL (ref 13.0–17.0)
MCH: 28.5 pg (ref 26.0–34.0)
MCHC: 33.9 g/dL (ref 30.0–36.0)
MCV: 84 fL (ref 80.0–100.0)
Platelets: 256 10*3/uL (ref 150–400)
RBC: 5.26 MIL/uL (ref 4.22–5.81)
RDW: 12.8 % (ref 11.5–15.5)
WBC: 8.7 10*3/uL (ref 4.0–10.5)
nRBC: 0 % (ref 0.0–0.2)

## 2023-01-30 LAB — PROTIME-INR
INR: 0.9 (ref 0.8–1.2)
Prothrombin Time: 12.7 seconds (ref 11.4–15.2)

## 2023-01-30 LAB — CBG MONITORING, ED: Glucose-Capillary: 115 mg/dL — ABNORMAL HIGH (ref 70–99)

## 2023-01-30 MED ORDER — FLUTICASONE PROPIONATE 50 MCG/ACT NA SUSP: 1.0000 | Freq: Every day | NASAL | Status: DC

## 2023-01-30 MED ORDER — VITAMIN D (ERGOCALCIFEROL) 1.25 MG (50000 UNIT) PO CAPS: 50000.0000 [IU] | ORAL_CAPSULE | ORAL | Status: DC

## 2023-01-30 MED ORDER — DEXAMETHASONE SODIUM PHOSPHATE 10 MG/ML IJ SOLN
10.0000 mg | Freq: Once | INTRAMUSCULAR | Status: AC
  Administered 2023-01-30: 10 mg via INTRAVENOUS
  Filled 2023-01-30: qty 1

## 2023-01-30 MED ORDER — HYDROMORPHONE HCL 1 MG/ML IJ SOLN
0.5000 mg | INTRAMUSCULAR | Status: DC | PRN
  Administered 2023-02-01: 1 mg via INTRAVENOUS
  Administered 2023-02-01: 0.5 mg via INTRAVENOUS
  Administered 2023-02-01 (×2): 1 mg via INTRAVENOUS
  Filled 2023-01-30 (×4): qty 1

## 2023-01-30 MED ORDER — ONDANSETRON HCL 4 MG PO TABS
4.0000 mg | ORAL_TABLET | Freq: Four times a day (QID) | ORAL | Status: DC | PRN
  Administered 2023-02-01 – 2023-02-02 (×2): 4 mg via ORAL
  Filled 2023-01-30 (×2): qty 1

## 2023-01-30 MED ORDER — ACETAMINOPHEN 325 MG PO TABS
650.0000 mg | ORAL_TABLET | Freq: Four times a day (QID) | ORAL | Status: DC | PRN
  Administered 2023-01-30 – 2023-02-01 (×4): 650 mg via ORAL
  Filled 2023-01-30 (×5): qty 2

## 2023-01-30 MED ORDER — AMLODIPINE BESYLATE 5 MG PO TABS
5.0000 mg | ORAL_TABLET | Freq: Every day | ORAL | Status: DC
  Administered 2023-01-30 – 2023-02-02 (×4): 5 mg via ORAL
  Filled 2023-01-30 (×4): qty 1

## 2023-01-30 MED ORDER — DOCUSATE SODIUM 100 MG PO CAPS
100.0000 mg | ORAL_CAPSULE | Freq: Two times a day (BID) | ORAL | Status: DC
  Administered 2023-01-31 – 2023-02-02 (×4): 100 mg via ORAL
  Filled 2023-01-30 (×4): qty 1

## 2023-01-30 MED ORDER — CHLORHEXIDINE GLUCONATE CLOTH 2 % EX PADS
6.0000 | MEDICATED_PAD | Freq: Every day | CUTANEOUS | Status: DC
  Administered 2023-01-31: 6 via TOPICAL

## 2023-01-30 MED ORDER — ACETAMINOPHEN 650 MG RE SUPP: 650.0000 mg | Freq: Four times a day (QID) | RECTAL | Status: DC | PRN

## 2023-01-30 MED ORDER — HYDROCODONE-ACETAMINOPHEN 5-325 MG PO TABS
1.0000 | ORAL_TABLET | ORAL | Status: DC | PRN
  Administered 2023-02-01 – 2023-02-02 (×3): 1 via ORAL
  Filled 2023-01-30 (×3): qty 1

## 2023-01-30 MED ORDER — HYDRALAZINE HCL 20 MG/ML IJ SOLN
5.0000 mg | INTRAMUSCULAR | Status: DC | PRN
  Filled 2023-01-30: qty 1

## 2023-01-30 MED ORDER — FAMOTIDINE 20 MG PO TABS
40.0000 mg | ORAL_TABLET | Freq: Every day | ORAL | Status: DC
  Administered 2023-01-30 – 2023-02-02 (×4): 40 mg via ORAL
  Filled 2023-01-30 (×4): qty 2

## 2023-01-30 MED ORDER — FLEET ENEMA RE ENEM: 1.0000 | ENEMA | Freq: Once | RECTAL | Status: DC | PRN

## 2023-01-30 MED ORDER — GADOBUTROL 1 MMOL/ML IV SOLN
10.0000 mL | Freq: Once | INTRAVENOUS | Status: AC | PRN
  Administered 2023-01-30: 10 mL via INTRAVENOUS

## 2023-01-30 MED ORDER — BISACODYL 10 MG RE SUPP: 10.0000 mg | Freq: Every day | RECTAL | Status: DC | PRN

## 2023-01-30 MED ORDER — BENAZEPRIL HCL 5 MG PO TABS
10.0000 mg | ORAL_TABLET | Freq: Every day | ORAL | Status: DC
  Administered 2023-01-30 – 2023-02-02 (×4): 10 mg via ORAL
  Filled 2023-01-30 (×5): qty 2

## 2023-01-30 MED ORDER — ATORVASTATIN CALCIUM 10 MG PO TABS
10.0000 mg | ORAL_TABLET | Freq: Every day | ORAL | Status: DC
  Administered 2023-01-30 – 2023-02-02 (×4): 10 mg via ORAL
  Filled 2023-01-30 (×4): qty 1

## 2023-01-30 MED ORDER — DEXAMETHASONE SODIUM PHOSPHATE 4 MG/ML IJ SOLN
4.0000 mg | Freq: Four times a day (QID) | INTRAMUSCULAR | Status: DC
  Administered 2023-01-30 – 2023-02-02 (×10): 4 mg via INTRAVENOUS
  Filled 2023-01-30 (×10): qty 1

## 2023-01-30 MED ORDER — POLYETHYLENE GLYCOL 3350 17 G PO PACK: 17.0000 g | PACK | Freq: Every day | ORAL | Status: DC | PRN

## 2023-01-30 MED ORDER — ONDANSETRON HCL 4 MG/2ML IJ SOLN
4.0000 mg | Freq: Four times a day (QID) | INTRAMUSCULAR | Status: DC | PRN
  Administered 2023-02-01: 4 mg via INTRAVENOUS
  Filled 2023-01-30: qty 2

## 2023-01-30 MED ORDER — ORAL CARE MOUTH RINSE: 15.0000 mL | OROMUCOSAL | Status: DC | PRN

## 2023-01-30 MED ORDER — LORATADINE 10 MG PO TABS
10.0000 mg | ORAL_TABLET | Freq: Every day | ORAL | Status: DC
  Filled 2023-01-30: qty 1

## 2023-01-30 MED ORDER — LORAZEPAM 2 MG/ML IJ SOLN
0.5000 mg | Freq: Once | INTRAMUSCULAR | Status: AC
  Administered 2023-01-30: 0.5 mg via INTRAVENOUS
  Filled 2023-01-30: qty 1

## 2023-01-30 NOTE — ED Triage Notes (Signed)
Pt was sent by PCP due to CT head show brain tumor and swelling of brain, AMS. Pt is A&Ox4. Pt walked well to triage.

## 2023-01-30 NOTE — ED Notes (Signed)
ED TO INPATIENT HANDOFF REPORT  ED Nurse Name and Phone #: Marcello Moores [phone]  S Name/Age/Gender Juan Garrett 71 y.o. male Room/Bed: 032C/032C  Code Status   Code Status: Full Code  Home/SNF/Other Home Patient oriented to: self, place, time, and situation Is this baseline? Yes   Triage Complete: Triage complete  Chief Complaint Brain lesion [G93.9]  Triage Note Pt was sent by PCP due to CT head show brain tumor and swelling of brain, AMS. Pt is A&Ox4. Pt walked well to triage.   Allergies Allergies  Allergen Reactions   Aspirin Other (See Comments)    Level of Care/Admitting Diagnosis ED Disposition     ED Disposition  Admit   Condition  --   Comment  Hospital Area: [facility] [[id number]]  Level of Care: Progressive [102]  Admit to Progressive based on following criteria: NEUROLOGICAL AND NEUROSURGICAL complex patients with significant risk of instability, who do not meet ICU criteria, yet require close observation or frequent assessment (< / = every 2 - 4 hours) with medical / nursing intervention.  May admit patient to [facility] or [facility] if equivalent level of care is available:: No  Covid Evaluation: Asymptomatic - no recent exposure (last 10 days) testing not required  Diagnosis: Brain lesion [536644]  Admitting Physician: Tressie Stalker [[id number]]  Attending Physician: [place] Sheehan, JEFFREY [[id number]]  Bed request comments: 4NP  Certification:: I certify this patient will need inpatient services for at least 2 midnights  Expected Medical Readiness: 02/04/2023          B Medical/Surgery History Past Medical History:  Diagnosis Date   Allergy    seasonal allergies   Arthritis    bilateral knees   Cataract    not a surgical candidate at this time (07/06/2020)   GERD (gastroesophageal reflux disease)    on meds   Hypertension    on meds   Past Surgical History:  Procedure Laterality Date   CARPAL TUNNEL RELEASE Bilateral 1997    CHONDROPLASTY Right 09/23/2019   Procedure: CHONDROPLASTY;  Surgeon: Tarry Kos, MD;  Location: [facility];  Service: Orthopedics;  Laterality: Right;   KNEE ARTHROSCOPY WITH MEDIAL MENISECTOMY Right 09/23/2019   Procedure: RIGHT KNEE ARTHROSCOPY WITH PARTIAL MEDIAL MENISCECTOMY;  Surgeon: Tarry Kos, MD;  Location: [facility];  Service: Orthopedics;  Laterality: Right;   KNEE SURGERY Left 1992     A IV Location/Drains/Wounds Patient Lines/Drains/Airways Status     Active Line/Drains/Airways     Name Placement date Placement time Site Days   Peripheral IV 01/30/23 20 G Anterior;Left;Proximal Forearm 01/30/23  1430  Forearm  less than 1            Intake/Output Last 24 hours No intake or output data in the 24 hours ending 01/30/23 2216  Labs/Imaging Results for orders placed or performed during the hospital encounter of 01/30/23 (from the past 48 hour(s))  Comprehensive metabolic panel     Status: Abnormal   Collection Time: 01/30/23 12:33 PM  Result Value Ref Range   Sodium 141 135 - 145 mmol/L   Potassium 3.9 3.5 - 5.1 mmol/L   Chloride 103 98 - 111 mmol/L   CO2 23 22 - 32 mmol/L   Glucose, Bld 111 (H) 70 - 99 mg/dL    Comment: Glucose reference range applies only to samples taken after fasting for at least 8 hours.   BUN 22 8 - 23 mg/dL   Creatinine, Ser 0.34  0.61 - 1.24 mg/dL   Calcium 9.6 8.9 - 16.1 mg/dL   Total Protein 6.8 6.5 - 8.1 g/dL   Albumin 3.8 3.5 - 5.0 g/dL   AST 19 15 - 41 U/L   ALT 22 0 - 44 U/L   Alkaline Phosphatase 50 38 - 126 U/L   Total Bilirubin 0.6 0.3 - 1.2 mg/dL   GFR, Estimated >09 >60 mL/min    Comment: (NOTE) Calculated using the CKD-EPI Creatinine Equation (2021)    Anion gap 15 5 - 15    Comment: Performed at [facility], [address] [place], [place] [address]  CBC     Status: None   Collection Time: 01/30/23 12:33 PM  Result Value Ref Range   WBC 8.7 4.0 - 10.5 K/uL   RBC 5.26 4.22 -  5.81 MIL/uL   Hemoglobin 15.0 13.0 - 17.0 g/dL   HCT 81.1 91.4 - 78.2 %   MCV 84.0 80.0 - 100.0 fL   MCH 28.5 26.0 - 34.0 pg   MCHC 33.9 30.0 - 36.0 g/dL   RDW 95.6 21.3 - 08.6 %   Platelets 256 150 - 400 K/uL   nRBC 0.0 0.0 - 0.2 %    Comment: Performed at [facility], [address] [place], [place] [address]  CBG monitoring, ED     Status: Abnormal   Collection Time: 01/30/23 12:39 PM  Result Value Ref Range   Glucose-Capillary 115 (H) 70 - 99 mg/dL    Comment: Glucose reference range applies only to samples taken after fasting for at least 8 hours.  Protime-INR     Status: None   Collection Time: 01/30/23  2:24 PM  Result Value Ref Range   Prothrombin Time 12.7 11.4 - 15.2 seconds   INR 0.9 0.8 - 1.2    Comment: (NOTE) INR goal varies based on device and disease states. Performed at [facility], [address] [place], [place] [address]    MR BRAIN W WO CONTRAST  Result Date: 01/30/2023 CLINICAL DATA:  Provided history: Brain/CNS neoplasm, staging EXAM: MRI HEAD WITHOUT AND WITH CONTRAST TECHNIQUE: Multiplanar, multiecho pulse sequences of the brain and surrounding structures were obtained without and with intravenous contrast. CONTRAST:  10 mL Gadavist intravenous contrast COMPARISON:  Head CT 01/30/2023. FINDINGS: Brain: No age advanced or lobar predominant parenchymal atrophy. 6.8 x 5.5 x 5.1 cm predominantly cystic/necrotic mass in the right cerebral hemisphere, centered within the right temporal lobe. Pre-contrast T1 hyperintensity and diffusion-weighted signal hyperintensity within the cystic/necrotic component of the mass posteriorly, likely reflecting non-acute blood products. Curvilinear and irregular enhancement surrounding the cystic/necrotic component of the mass, measuring up to 1.3 cm in thickness. Moderate surrounding T2 FLAIR hyperintense parenchymal signal abnormality, which may reflect edema and/or non-enhancing infiltrative tumor. Mass effect with  near complete effacement of the right lateral ventricle and 8 mm leftward midline shift. Additionally, there is a degree of right uncal herniation with partial effacement of the basal cisterns on the right and mass effect upon the right aspect of the midbrain. 1-2 mm nodular enhancing lesion within the right internal auditory canal (series 13, image 24). Multifocal T2 FLAIR hyperintense signal abnormality elsewhere within the cerebral white matter, nonspecific but compatible with mild chronic small vessel ischemic disease. There is no acute infarct. No extra-axial fluid collection. Vascular: Maintained flow voids within the proximal large arterial vessels. Skull and upper cervical spine: No focal suspicious marrow lesion. Incompletely assessed cervical spondylosis. Sinuses/Orbits: No  mass or acute finding within the imaged orbits. Minimal mucosal thickening within the left frontal and bilateral ethmoid sinuses. IMPRESSION: 1. 6.8 x 5.5 x 5.1 cm predominantly cystic/necrotic mass within the right cerebral hemisphere, centered within the right temporal lobe, as described. This is favored to reflect a primary CNS neoplasm (such as a high-grade glioma), although a metastasis cannot be excluded. Neurosurgery and neuro-oncology consultation recommended. Mass effect with 8 mm leftward midline shift, a degree of right uncal herniation (with mass effect upon the right aspect of the midbrain) and near complete effacement of the right lateral ventricle. No evidence of ventricular entrapment. 2. 1-2 mm nodular enhancing lesion within the right internal auditory canal, which may reflect a vestibular schwannoma. Again, an intracranial metastasis cannot be excluded. Electronically Signed   By: Jackey Loge D.O.   On: 01/30/2023 17:59   DG Chest Port 1 View  Result Date: 01/30/2023 CLINICAL DATA:  new brain lesion EXAM: PORTABLE CHEST 1 VIEW COMPARISON:  07/05/2021. FINDINGS: Bilateral lung fields are clear. Bilateral  costophrenic angles are clear. Normal cardio-mediastinal silhouette. No acute osseous abnormalities. The soft tissues are within normal limits. IMPRESSION: No active disease. Electronically Signed   By: Jules Schick M.D.   On: 01/30/2023 14:21   CT HEAD WO CONTRAST ( )  Addendum Date: 01/30/2023   ADDENDUM REPORT: 01/30/2023 11:38 ADDENDUM: Study discussed by telephone with Dr. Georgann Housekeeper on 01/30/2023 at 1129 hours. Electronically Signed   By: Odessa Fleming M.D.   On: 01/30/2023 11:38   Result Date: 01/30/2023 CLINICAL DATA:  72 year old male with headache, vertigo for 3 weeks. EXAM: CT HEAD WITHOUT CONTRAST TECHNIQUE: Contiguous axial images were obtained from the base of the skull through the vertex without intravenous contrast. RADIATION DOSE REDUCTION: This exam was performed according to the departmental dose-optimization program which includes automated exposure control, adjustment of the mA and/or kV according to patient size and/or use of iterative reconstruction technique. COMPARISON:  None Available. FINDINGS: Brain: Mixed density but predominantly cystic/fluid density mass in the right cerebral hemisphere. The abnormality expands the right temporal lobe. The fairly circumscribed cystic component encompasses 67 by 40 x 45 mm (AP by transverse by CC), but there is additional regional tumoral edema, and hyperdensity of the parenchyma lateral to the cystic mass is indeterminate for additional tumor versus compressed normal parenchyma. Mass effect on the right lateral ventricle as well as up to 9 mm of leftward midline shift (series 2, image 16). No ventriculomegaly or trapping of the left lateral ventricle. Basilar cisterns remain normal. Left hemisphere and posterior fossa gray-white differentiation is normal. No acute intracranial hemorrhage identified. No cortically based acute infarct identified. Vascular: Calcified atherosclerosis at the skull base. No suspicious intracranial vascular  hyperdensity. Skull: Intact, negative. Sinuses/Orbits: Visualized paranasal sinuses and mastoids are well aerated. Other: Visualized orbits and scalp soft tissues are within normal limits. IMPRESSION: Large nearly 7 cm predominantly cystic mass in the right cerebral hemisphere, epicenter at the temporal lobe. Regional tumoral edema, and associated intracranial mass effect with and up to 9 mm of leftward midline shift. Constellation favors of partially cystic primary brain tumor over solitary metastasis or other differential considerations. Recommend Brain MRI without and with contrast to further characterize. Electronically Signed: By: Odessa Fleming M.D. On: 01/30/2023 11:14    Pending Labs Unresulted Labs (From admission, onward)    None       Vitals/Pain Today's Vitals   01/30/23 2015 01/30/23 2030 01/30/23 2038 01/30/23 2150  BP: 97/66 (!) 104/59  98/65  Pulse: 74 65  66  Resp:  18  14  Temp:   98 F (36.7 C)   TempSrc:   Oral   SpO2: 92% 98%  94%  Weight:      Height:      PainSc:        Isolation Precautions No active isolations  Medications Medications  dexamethasone (DECADRON) injection 4 mg (4 mg Intravenous Given 01/30/23 1830)  amLODipine (NORVASC) tablet 5 mg (5 mg Oral Given 01/30/23 1849)    And  benazepril (LOTENSIN) tablet 10 mg (10 mg Oral Given 01/30/23 1921)  atorvastatin (LIPITOR) tablet 10 mg (10 mg Oral Given 01/30/23 1830)  famotidine (PEPCID) tablet 40 mg (40 mg Oral Given 01/30/23 1830)  acetaminophen (TYLENOL) tablet 650 mg (650 mg Oral Given 01/30/23 1837)    Or  acetaminophen (TYLENOL) suppository 650 mg ( Rectal See Alternative 01/30/23 1837)  HYDROcodone-acetaminophen (NORCO/VICODIN) 5-325 MG per tablet 1-2 tablet (has no administration in time range)  HYDROmorphone (DILAUDID) injection 0.5-1 mg (has no administration in time range)  docusate sodium (COLACE) capsule 100 mg (100 mg Oral Not Given 01/30/23 2147)  polyethylene glycol (MIRALAX / GLYCOLAX) packet  17 g (has no administration in time range)  bisacodyl (DULCOLAX) suppository 10 mg (has no administration in time range)  sodium phosphate (FLEET) enema 1 enema (has no administration in time range)  ondansetron (ZOFRAN) tablet 4 mg (has no administration in time range)    Or  ondansetron (ZOFRAN) injection 4 mg (has no administration in time range)  hydrALAZINE (APRESOLINE) injection 5 mg (has no administration in time range)  dexamethasone (DECADRON) injection 10 mg (10 mg Intravenous Given 01/30/23 1430)  LORazepam (ATIVAN) injection 0.5 mg (0.5 mg Intravenous Given 01/30/23 1431)  gadobutrol (GADAVIST) 1 MMOL/ML injection 10 mL (10 mLs Intravenous Contrast Given 01/30/23 1558)    Mobility walks     Focused Assessments Neuro Assessment Handoff:  Swallow screen pass? Yes          Neuro Assessment: Within Defined Limits (No complaints or change in mental status) Neuro Checks:      Has TPA been given? No If patient is a Neuro Trauma and patient is going to OR before floor call report to 4N Charge nurse: [phone] or [phone]   R Recommendations: See Admitting Provider Note  Report given to:   Additional Notes:  Neuro checks q2 hours. Has had no change in mental status. Patient is A&Ox4.

## 2023-01-30 NOTE — H&P (Addendum)
Providing Compassionate, Quality Care - Together    Juan Garrett is an 72 y.o. male.   Chief Complaint: Headache, CT head demonstrates brain mass HPI:  Juan Garrett is a 72 year old male with a past medical history of hypertension, hyperlipidemia, GERD, Stage 2 CKD, and BPH. His wife reports a history of basal cell carcinoma that was fully resected with Mohs surgery. She reports no other history of cancer. He visited his primary care provider on 01/25/2023 with complaints of fatigue, headache, and memory changes. At that office visit, his wife reported Juan Garrett was having trouble with stumbling/falls and had gotten lost driving to work. His PCP ordered a head CT scan that demonstrated a mass with a large cystic component. Juan Garrett was advised to come to the emergency department for further evaluation. Upon his arrival to the emergency department, Neurosurgery was contacted for further evaluation and recommendations.   During this assessment, Juan Garrett reports headaches that have been worsening over the last few weeks. The headache is more focal now and is mainly in the right occipital area. He has noticed facial asymmetry and feeling uncoordinated. He has worked as a Paediatric nurse for years and has gotten lost on the way to work on two separate occasions. He denies changes in vision, nausea, vomiting, or issues with speech.  Past Medical History:  Diagnosis Date   Allergy    seasonal allergies   Arthritis    bilateral knees   Cataract    not a surgical candidate at this time (07/06/2020)   GERD (gastroesophageal reflux disease)    on meds   Hypertension    on meds    Past Surgical History:  Procedure Laterality Date   CARPAL TUNNEL RELEASE Bilateral 1997   CHONDROPLASTY Right 09/23/2019   Procedure: CHONDROPLASTY;  Surgeon: Tarry Kos, MD;  Location: [facility];  Service: Orthopedics;  Laterality: Right;   KNEE ARTHROSCOPY WITH MEDIAL MENISECTOMY Right  09/23/2019   Procedure: RIGHT KNEE ARTHROSCOPY WITH PARTIAL MEDIAL MENISCECTOMY;  Surgeon: Tarry Kos, MD;  Location: [facility];  Service: Orthopedics;  Laterality: Right;   KNEE SURGERY Left 1992    Family History  Problem Relation Age of Onset   Colon polyps Neg Hx    Colon cancer Neg Hx    Esophageal cancer Neg Hx    Stomach cancer Neg Hx    Rectal cancer Neg Hx    Social History:  reports that he has quit smoking. His smoking use included cigarettes. He has never used smokeless tobacco. He reports that he does not currently use alcohol. He reports that he does not use drugs.  Allergies:  Allergies  Allergen Reactions   Aspirin Other (See Comments)    (Not in a hospital admission)   Results for orders placed or performed during the hospital encounter of 01/30/23 (from the past 48 hour(s))  Comprehensive metabolic panel     Status: Abnormal   Collection Time: 01/30/23 12:33 PM  Result Value Ref Range   Sodium 141 135 - 145 mmol/L   Potassium 3.9 3.5 - 5.1 mmol/L   Chloride 103 98 - 111 mmol/L   CO2 23 22 - 32 mmol/L   Glucose, Bld 111 (H) 70 - 99 mg/dL    Comment: Glucose reference range applies only to samples taken after fasting for at least 8 hours.   BUN 22 8 - 23 mg/dL   Creatinine, Ser 1.61 0.61 - 1.24 mg/dL  Calcium 9.6 8.9 - 10.3 mg/dL   Total Protein 6.8 6.5 - 8.1 g/dL   Albumin 3.8 3.5 - 5.0 g/dL   AST 19 15 - 41 U/L   ALT 22 0 - 44 U/L   Alkaline Phosphatase 50 38 - 126 U/L   Total Bilirubin 0.6 0.3 - 1.2 mg/dL   GFR, Estimated >32 >35 mL/min    Comment: (NOTE) Calculated using the CKD-EPI Creatinine Equation (2021)    Anion gap 15 5 - 15    Comment: Performed at [facility], [address] [place], [place] [address]  CBC     Status: None   Collection Time: 01/30/23 12:33 PM  Result Value Ref Range   WBC 8.7 4.0 - 10.5 K/uL   RBC 5.26 4.22 - 5.81 MIL/uL   Hemoglobin 15.0 13.0 - 17.0 g/dL   HCT 02.5 42.7 - 06.2 %   MCV  84.0 80.0 - 100.0 fL   MCH 28.5 26.0 - 34.0 pg   MCHC 33.9 30.0 - 36.0 g/dL   RDW 37.6 28.3 - 15.1 %   Platelets 256 150 - 400 K/uL   nRBC 0.0 0.0 - 0.2 %    Comment: Performed at [facility], [address], [place], [place] [address]  CBG monitoring, ED     Status: Abnormal   Collection Time: 01/30/23 12:39 PM  Result Value Ref Range   Glucose-Capillary 115 (H) 70 - 99 mg/dL    Comment: Glucose reference range applies only to samples taken after fasting for at least 8 hours.  Protime-INR     Status: None   Collection Time: 01/30/23  2:24 PM  Result Value Ref Range   Prothrombin Time 12.7 11.4 - 15.2 seconds   INR 0.9 0.8 - 1.2    Comment: (NOTE) INR goal varies based on device and disease states. Performed at [facility], [address] [place], [place] [address]    DG Chest Port 1 View  Result Date: 01/30/2023 CLINICAL DATA:  new brain lesion EXAM: PORTABLE CHEST 1 VIEW COMPARISON:  07/05/2021. FINDINGS: Bilateral lung fields are clear. Bilateral costophrenic angles are clear. Normal cardio-mediastinal silhouette. No acute osseous abnormalities. The soft tissues are within normal limits. IMPRESSION: No active disease. Electronically Signed   By: Jules Schick M.D.   On: 01/30/2023 14:21   CT HEAD WO CONTRAST ( )  Addendum Date: 01/30/2023   ADDENDUM REPORT: 01/30/2023 11:38 ADDENDUM: Study discussed by telephone with Dr. Georgann Housekeeper on 01/30/2023 at 1129 hours. Electronically Signed   By: Odessa Fleming M.D.   On: 01/30/2023 11:38   Result Date: 01/30/2023 CLINICAL DATA:  72 year old male with headache, vertigo for 3 weeks. EXAM: CT HEAD WITHOUT CONTRAST TECHNIQUE: Contiguous axial images were obtained from the base of the skull through the vertex without intravenous contrast. RADIATION DOSE REDUCTION: This exam was performed according to the departmental dose-optimization program which includes automated exposure control, adjustment of the mA and/or kV according to  patient size and/or use of iterative reconstruction technique. COMPARISON:  None Available. FINDINGS: Brain: Mixed density but predominantly cystic/fluid density mass in the right cerebral hemisphere. The abnormality expands the right temporal lobe. The fairly circumscribed cystic component encompasses 67 by 40 x 45 mm (AP by transverse by CC), but there is additional regional tumoral edema, and hyperdensity of the parenchyma lateral to the cystic mass is indeterminate for additional tumor versus compressed normal parenchyma. Mass effect on the right lateral ventricle as well as  up to 9 mm of leftward midline shift (series 2, image 16). No ventriculomegaly or trapping of the left lateral ventricle. Basilar cisterns remain normal. Left hemisphere and posterior fossa gray-white differentiation is normal. No acute intracranial hemorrhage identified. No cortically based acute infarct identified. Vascular: Calcified atherosclerosis at the skull base. No suspicious intracranial vascular hyperdensity. Skull: Intact, negative. Sinuses/Orbits: Visualized paranasal sinuses and mastoids are well aerated. Other: Visualized orbits and scalp soft tissues are within normal limits. IMPRESSION: Large nearly 7 cm predominantly cystic mass in the right cerebral hemisphere, epicenter at the temporal lobe. Regional tumoral edema, and associated intracranial mass effect with and up to 9 mm of leftward midline shift. Constellation favors of partially cystic primary brain tumor over solitary metastasis or other differential considerations. Recommend Brain MRI without and with contrast to further characterize. Electronically Signed: By: Odessa Fleming M.D. On: 01/30/2023 11:14    Review of Systems  Constitutional: Negative.   HENT: Negative.    Eyes: Negative.   Respiratory: Negative.    Cardiovascular: Negative.   Gastrointestinal: Negative.   Endocrine: Negative.   Genitourinary: Negative.   Musculoskeletal: Negative.   Skin:  Negative.   Allergic/Immunologic: Negative.   Neurological:  Positive for facial asymmetry, weakness and headaches.  Hematological: Negative.   Psychiatric/Behavioral:  Positive for confusion and decreased concentration.     Blood pressure (!) 122/107, pulse 68, temperature 99.4 F (37.4 C), temperature source Oral, resp. rate 14, height 5\' 5"  (1.651 m), weight 81.6 kg, SpO2 100%. Physical Exam Constitutional:      Appearance: Normal appearance.  HENT:     Head: Normocephalic and atraumatic.     Nose: Nose normal.  Eyes:     Extraocular Movements: Extraocular movements intact.     Pupils: Pupils are equal, round, and reactive to light.  Cardiovascular:     Rate and Rhythm: Normal rate and regular rhythm.     Pulses: Normal pulses.  Pulmonary:     Effort: Pulmonary effort is normal. No respiratory distress.  Abdominal:     Palpations: Abdomen is soft.  Musculoskeletal:        General: Normal range of motion.     Cervical back: Normal range of motion and neck supple.  Skin:    General: Skin is warm and dry.     Capillary Refill: Capillary refill takes less than 2 seconds.  Neurological:     Mental Status: He is alert.     GCS: GCS eye subscore is 4. GCS verbal subscore is 5. GCS motor subscore is 6.     Cranial Nerves: Facial asymmetry present.     Sensory: Sensation is intact.     Motor: Motor function is intact.     Coordination: Finger-Nose-Finger Test abnormal.     Comments: Left finger to nose test abnormal Left facial droop  Psychiatric:        Attention and Perception: Attention normal.        Mood and Affect: Affect is flat.        Speech: Speech normal.        Behavior: Behavior is cooperative.        Thought Content: Thought content normal.      Assessment/Plan Juan Garrett has a large right-sided cerebral mass that appears predominantly cystic. There is 9 mm of leftward midline shift. The patient will be admitted for surgical planning and further work up. An  MRI with and without contrast has been ordered. IV decadron has also been ordered. The  patient should be made NPO at midnight tonight.   Floreen Comber, NP 01/30/2023, 4:11 PM

## 2023-01-30 NOTE — ED Provider Notes (Signed)
[place] EMERGENCY DEPARTMENT AT [facility] Provider Note   CSN: [id number] Arrival date & time: 01/30/23  1223     History  No chief complaint on file.   Juan Garrett is a 72 y.o. male.  HPI 72 year old male presents today with report of new brain lesion noted on CT.  Patient states that he has had some symptoms for "a while".  However, worse over the past 2 weeks.  Has had difficulty with balance and some headaches.  He was placed on prednisone for pain in his left foot and shoulder.  He states that this helped the shoulder but not with the foot.  He was seen by his primary care doctor and had a head CT done.  He was called today and told to come to the emergency department because he had a brain tumor.     Home Medications Prior to Admission medications   Medication Sig Start Date End Date Taking? Authorizing Provider  amLODipine-benazepril (LOTREL) 5-10 MG capsule Take 1 capsule by mouth daily. 08/17/19  Yes [provider]  atorvastatin (LIPITOR) 10 MG tablet Take 10 mg by mouth daily.   Yes [provider]  famotidine (PEPCID) 40 MG tablet Take 40 mg by mouth daily. 07/31/19  Yes [provider]  Fexofenadine HCl (ALLEGRA PO) Take by mouth daily.   Yes [provider]  fluticasone (FLONASE) 50 MCG/ACT nasal spray Place 1 spray into both nostrils daily. 11/27/22  Yes [provider]  ibuprofen (ADVIL) 200 MG tablet Take 200 mg by mouth daily as needed for mild pain.   Yes [provider]  predniSONE (DELTASONE) 10 MG tablet Take 1 tablet (10 mg total) by mouth daily with breakfast. 01/22/23  Yes Barnie Del R, NP  Vitamin D, Ergocalciferol, (DRISDOL) 1.25 MG (50000 UNIT) CAPS capsule Take 50,000 Units by mouth every 14 (fourteen) days. 05/29/19  Yes [provider]      Allergies    Aspirin    Review of Systems   Review of Systems  Physical Exam Updated Vital Signs BP (!) 122/107   Pulse 68    Temp 99.4 F (37.4 C) (Oral)   Resp 14   Ht 1.651 m (5\' 5" )   Wt 81.6 kg   SpO2 100%   BMI 29.94 kg/m  Physical Exam Vitals and nursing note reviewed.  HENT:     Head: Normocephalic.     Right Ear: External ear normal.     Left Ear: External ear normal.     Nose: Nose normal.     Mouth/Throat:     Pharynx: Oropharynx is clear.  Cardiovascular:     Rate and Rhythm: Normal rate and regular rhythm.     Pulses: Normal pulses.  Pulmonary:     Effort: Pulmonary effort is normal.     Breath sounds: Normal breath sounds.  Abdominal:     General: Bowel sounds are normal. There is no distension.     Palpations: Abdomen is soft.  Musculoskeletal:        General: Normal range of motion.     Cervical back: Normal range of motion.  Skin:    General: Skin is warm.     Capillary Refill: Capillary refill takes less than 2 seconds.  Neurological:     General: No focal deficit present.     Mental Status: He is alert.     Cranial Nerves: No cranial nerve deficit.     Motor: No weakness.  Coordination: Coordination normal.  Psychiatric:        Mood and Affect: Mood normal.        Behavior: Behavior normal.     ED Results / Procedures / Treatments   Labs (all labs ordered are listed, but only abnormal results are displayed) Labs Reviewed  COMPREHENSIVE METABOLIC PANEL - Abnormal; Notable for the following components:      Result Value   Glucose, Bld 111 (*)    All other components within normal limits  CBG MONITORING, ED - Abnormal; Notable for the following components:   Glucose-Capillary 115 (*)    All other components within normal limits  CBC  PROTIME-INR    EKG None  Radiology DG Chest Port 1 View  Result Date: 01/30/2023 CLINICAL DATA:  new brain lesion EXAM: PORTABLE CHEST 1 VIEW COMPARISON:  07/05/2021. FINDINGS: Bilateral lung fields are clear. Bilateral costophrenic angles are clear. Normal cardio-mediastinal silhouette. No acute osseous abnormalities. The  soft tissues are within normal limits. IMPRESSION: No active disease. Electronically Signed   By: Jules Schick M.D.   On: 01/30/2023 14:21   CT HEAD WO CONTRAST ( )  Addendum Date: 01/30/2023   ADDENDUM REPORT: 01/30/2023 11:38 ADDENDUM: Study discussed by telephone with Dr. Georgann Housekeeper on 01/30/2023 at 1129 hours. Electronically Signed   By: Odessa Fleming M.D.   On: 01/30/2023 11:38   Result Date: 01/30/2023 CLINICAL DATA:  72 year old male with headache, vertigo for 3 weeks. EXAM: CT HEAD WITHOUT CONTRAST TECHNIQUE: Contiguous axial images were obtained from the base of the skull through the vertex without intravenous contrast. RADIATION DOSE REDUCTION: This exam was performed according to the departmental dose-optimization program which includes automated exposure control, adjustment of the mA and/or kV according to patient size and/or use of iterative reconstruction technique. COMPARISON:  None Available. FINDINGS: Brain: Mixed density but predominantly cystic/fluid density mass in the right cerebral hemisphere. The abnormality expands the right temporal lobe. The fairly circumscribed cystic component encompasses 67 by 40 x 45 mm (AP by transverse by CC), but there is additional regional tumoral edema, and hyperdensity of the parenchyma lateral to the cystic mass is indeterminate for additional tumor versus compressed normal parenchyma. Mass effect on the right lateral ventricle as well as up to 9 mm of leftward midline shift (series 2, image 16). No ventriculomegaly or trapping of the left lateral ventricle. Basilar cisterns remain normal. Left hemisphere and posterior fossa gray-white differentiation is normal. No acute intracranial hemorrhage identified. No cortically based acute infarct identified. Vascular: Calcified atherosclerosis at the skull base. No suspicious intracranial vascular hyperdensity. Skull: Intact, negative. Sinuses/Orbits: Visualized paranasal sinuses and mastoids are well aerated.  Other: Visualized orbits and scalp soft tissues are within normal limits. IMPRESSION: Large nearly 7 cm predominantly cystic mass in the right cerebral hemisphere, epicenter at the temporal lobe. Regional tumoral edema, and associated intracranial mass effect with and up to 9 mm of leftward midline shift. Constellation favors of partially cystic primary brain tumor over solitary metastasis or other differential considerations. Recommend Brain MRI without and with contrast to further characterize. Electronically Signed: By: Odessa Fleming M.D. On: 01/30/2023 11:14    Procedures Procedures    Medications Ordered in ED Medications  dexamethasone (DECADRON) injection 4 mg (has no administration in time range)  dexamethasone (DECADRON) injection 10 mg (10 mg Intravenous Given 01/30/23 1430)  LORazepam (ATIVAN) injection 0.5 mg (0.5 mg Intravenous Given 01/30/23 1431)    ED Course/ Medical Decision Making/ A&P Clinical Course as of 01/30/23  1610  Wed Jan 30, 2023  1540 Labs reviewed interpreted and within normal limits including CBC and complete metabolic panel [DR]    Clinical Course User Index [DR] Margarita Grizzle, MD                                 Medical Decision Making Amount and/or Complexity of Data Reviewed Labs: ordered. Radiology: ordered.  Risk Prescription drug management.    72 year old man presents today with reports of new brain lesion. He reports that he has been having difficulty walking for at least 2 weeks.  He states that he has been off balance.  Is also been having headaches. CT reviewed interpreted and significant for new large lesion Differential diagnosis includes but is not limited to primary brain tumor, metastatic disease, infection. Was some mass effect noted.  Patient treated here with Decadron and given Ativan 0.5 mg IV. Care discussed with neurosurgery.  They have placed orders for MRI and message that they will admit patient.       Final Clinical  Impression(s) / ED Diagnoses Final diagnoses:  Brain mass    Rx / DC Orders ED Discharge Orders     None         Margarita Grizzle, MD 01/30/23 1541

## 2023-01-31 ENCOUNTER — Other Ambulatory Visit: Payer: Self-pay

## 2023-01-31 ENCOUNTER — Encounter (HOSPITAL_COMMUNITY): Payer: Self-pay | Admitting: Neurosurgery

## 2023-01-31 ENCOUNTER — Encounter (HOSPITAL_COMMUNITY)
Admission: EM | Disposition: A | Discharge status: Home / Self Care | Payer: Self-pay | Source: Home / Self Care | Attending: Neurosurgery

## 2023-01-31 ENCOUNTER — Inpatient Hospital Stay (HOSPITAL_COMMUNITY): Payer: Medicare Other

## 2023-01-31 DIAGNOSIS — D496 Neoplasm of unspecified behavior of brain: Secondary | ICD-10-CM

## 2023-01-31 DIAGNOSIS — I10 Essential (primary) hypertension: Secondary | ICD-10-CM | POA: Diagnosis not present

## 2023-01-31 DIAGNOSIS — Z87891 Personal history of nicotine dependence: Secondary | ICD-10-CM

## 2023-01-31 HISTORY — PX: APPLICATION OF CRANIAL NAVIGATION: SHX6578

## 2023-01-31 HISTORY — PX: CRANIOTOMY: SHX93

## 2023-01-31 LAB — SURGICAL PCR SCREEN
MRSA, PCR: NEGATIVE
Staphylococcus aureus: NEGATIVE

## 2023-01-31 LAB — TYPE AND SCREEN
ABO/RH(D): A NEG
Antibody Screen: NEGATIVE

## 2023-01-31 LAB — ABO/RH: ABO/RH(D): A NEG

## 2023-01-31 SURGERY — CRANIOTOMY TUMOR EXCISION
Anesthesia: General | Site: Head | Laterality: Right

## 2023-01-31 MED ORDER — THROMBIN 5000 UNITS EX SOLR
CUTANEOUS | Status: AC
  Filled 2023-01-31: qty 5000

## 2023-01-31 MED ORDER — OXYCODONE HCL 5 MG PO TABS: 5.0000 mg | ORAL_TABLET | Freq: Once | ORAL | Status: DC | PRN

## 2023-01-31 MED ORDER — BUPIVACAINE-EPINEPHRINE 0.5% -1:200000 IJ SOLN
INTRAMUSCULAR | Status: DC | PRN
  Administered 2023-01-31: 10 mL

## 2023-01-31 MED ORDER — LIDOCAINE 2% (20 MG/ML) 5 ML SYRINGE
INTRAMUSCULAR | Status: AC
  Filled 2023-01-31: qty 20

## 2023-01-31 MED ORDER — ROCURONIUM BROMIDE 10 MG/ML (PF) SYRINGE
PREFILLED_SYRINGE | INTRAVENOUS | Status: DC | PRN
  Administered 2023-01-31: 30 mg via INTRAVENOUS
  Administered 2023-01-31: 70 mg via INTRAVENOUS

## 2023-01-31 MED ORDER — EPHEDRINE 5 MG/ML INJ
INTRAVENOUS | Status: AC
  Filled 2023-01-31: qty 10

## 2023-01-31 MED ORDER — SODIUM CHLORIDE 0.9 % IV SOLN: INTRAVENOUS | Status: DC | PRN

## 2023-01-31 MED ORDER — SODIUM CHLORIDE 0.9 % IV SOLN: INTRAVENOUS | Status: DC

## 2023-01-31 MED ORDER — ACETAMINOPHEN 10 MG/ML IV SOLN: 1000.0000 mg | Freq: Once | INTRAVENOUS | Status: DC | PRN

## 2023-01-31 MED ORDER — ACETAMINOPHEN 160 MG/5ML PO SOLN: 1000.0000 mg | Freq: Once | ORAL | Status: DC | PRN

## 2023-01-31 MED ORDER — PROPOFOL 10 MG/ML IV BOLUS
INTRAVENOUS | Status: AC
  Filled 2023-01-31: qty 20

## 2023-01-31 MED ORDER — DEXAMETHASONE SODIUM PHOSPHATE 10 MG/ML IJ SOLN
INTRAMUSCULAR | Status: DC | PRN
  Administered 2023-01-31: 10 mg via INTRAVENOUS

## 2023-01-31 MED ORDER — PHENYLEPHRINE 80 MCG/ML (10ML) SYRINGE FOR IV PUSH (FOR BLOOD PRESSURE SUPPORT)
PREFILLED_SYRINGE | INTRAVENOUS | Status: AC
  Filled 2023-01-31: qty 30

## 2023-01-31 MED ORDER — MICROFIBRILLAR COLL HEMOSTAT EX POWD
CUTANEOUS | Status: DC | PRN
  Administered 2023-01-31: 5 g via TOPICAL

## 2023-01-31 MED ORDER — ORAL CARE MOUTH RINSE: 15.0000 mL | Freq: Once | OROMUCOSAL | Status: AC

## 2023-01-31 MED ORDER — BACITRACIN ZINC 500 UNIT/GM EX OINT
TOPICAL_OINTMENT | CUTANEOUS | Status: DC | PRN
  Administered 2023-01-31: 1 via TOPICAL

## 2023-01-31 MED ORDER — SUGAMMADEX SODIUM 200 MG/2ML IV SOLN
INTRAVENOUS | Status: DC | PRN
  Administered 2023-01-31: 319.2 mg via INTRAVENOUS

## 2023-01-31 MED ORDER — FENTANYL CITRATE (PF) 250 MCG/5ML IJ SOLN
INTRAMUSCULAR | Status: DC | PRN
  Administered 2023-01-31: 100 ug via INTRAVENOUS
  Administered 2023-01-31 (×2): 50 ug via INTRAVENOUS

## 2023-01-31 MED ORDER — ONDANSETRON HCL 4 MG/2ML IJ SOLN
INTRAMUSCULAR | Status: DC | PRN
  Administered 2023-01-31: 4 mg via INTRAVENOUS

## 2023-01-31 MED ORDER — ESMOLOL HCL 100 MG/10ML IV SOLN
INTRAVENOUS | Status: DC | PRN
  Administered 2023-01-31 (×2): 20 mg via INTRAVENOUS

## 2023-01-31 MED ORDER — LACTATED RINGERS IV SOLN: INTRAVENOUS | Status: DC

## 2023-01-31 MED ORDER — SUCCINYLCHOLINE CHLORIDE 200 MG/10ML IV SOSY
PREFILLED_SYRINGE | INTRAVENOUS | Status: AC
  Filled 2023-01-31: qty 10

## 2023-01-31 MED ORDER — KETOROLAC TROMETHAMINE 30 MG/ML IJ SOLN
INTRAMUSCULAR | Status: AC
  Filled 2023-01-31: qty 1

## 2023-01-31 MED ORDER — BACITRACIN ZINC 500 UNIT/GM EX OINT
TOPICAL_OINTMENT | CUTANEOUS | Status: AC
  Filled 2023-01-31: qty 28.35

## 2023-01-31 MED ORDER — LEVETIRACETAM IN NACL 500 MG/100ML IV SOLN
500.0000 mg | INTRAVENOUS | Status: AC
  Administered 2023-01-31: 500 mg via INTRAVENOUS
  Filled 2023-01-31: qty 100

## 2023-01-31 MED ORDER — ONDANSETRON HCL 4 MG/2ML IJ SOLN
INTRAMUSCULAR | Status: AC
  Filled 2023-01-31: qty 4

## 2023-01-31 MED ORDER — ROCURONIUM BROMIDE 10 MG/ML (PF) SYRINGE
PREFILLED_SYRINGE | INTRAVENOUS | Status: AC
  Filled 2023-01-31: qty 40

## 2023-01-31 MED ORDER — ESMOLOL HCL 100 MG/10ML IV SOLN
INTRAVENOUS | Status: AC
  Filled 2023-01-31: qty 10

## 2023-01-31 MED ORDER — ACETAMINOPHEN 500 MG PO TABS: 1000.0000 mg | ORAL_TABLET | Freq: Once | ORAL | Status: DC | PRN

## 2023-01-31 MED ORDER — PROPOFOL 10 MG/ML IV BOLUS
INTRAVENOUS | Status: DC | PRN
  Administered 2023-01-31: 150 mg via INTRAVENOUS
  Administered 2023-01-31: 30 mg via INTRAVENOUS
  Administered 2023-01-31: 50 mg via INTRAVENOUS

## 2023-01-31 MED ORDER — FENTANYL CITRATE (PF) 250 MCG/5ML IJ SOLN
INTRAMUSCULAR | Status: AC
  Filled 2023-01-31: qty 5

## 2023-01-31 MED ORDER — PHENYLEPHRINE 80 MCG/ML (10ML) SYRINGE FOR IV PUSH (FOR BLOOD PRESSURE SUPPORT)
PREFILLED_SYRINGE | INTRAVENOUS | Status: DC | PRN
  Administered 2023-01-31: 80 ug via INTRAVENOUS

## 2023-01-31 MED ORDER — CEFAZOLIN SODIUM 1 G IJ SOLR
INTRAMUSCULAR | Status: AC
  Filled 2023-01-31: qty 20

## 2023-01-31 MED ORDER — DEXAMETHASONE SODIUM PHOSPHATE 10 MG/ML IJ SOLN
INTRAMUSCULAR | Status: AC
  Filled 2023-01-31: qty 2

## 2023-01-31 MED ORDER — THROMBIN 5000 UNITS EX SOLR: OROMUCOSAL | Status: DC | PRN

## 2023-01-31 MED ORDER — 0.9 % SODIUM CHLORIDE (POUR BTL) OPTIME
TOPICAL | Status: DC | PRN
  Administered 2023-01-31: 2000 mL

## 2023-01-31 MED ORDER — CHLORHEXIDINE GLUCONATE 0.12 % MT SOLN
15.0000 mL | Freq: Once | OROMUCOSAL | Status: AC
  Administered 2023-01-31: 15 mL via OROMUCOSAL
  Filled 2023-01-31: qty 15

## 2023-01-31 MED ORDER — THROMBIN 20000 UNITS EX SOLR
CUTANEOUS | Status: AC
  Filled 2023-01-31: qty 20000

## 2023-01-31 MED ORDER — LIDOCAINE 2% (20 MG/ML) 5 ML SYRINGE
INTRAMUSCULAR | Status: DC | PRN
  Administered 2023-01-31: 100 mg via INTRAVENOUS

## 2023-01-31 MED ORDER — OXYCODONE HCL 5 MG/5ML PO SOLN: 5.0000 mg | Freq: Once | ORAL | Status: DC | PRN

## 2023-01-31 MED ORDER — PHENYLEPHRINE HCL-NACL 20-0.9 MG/250ML-% IV SOLN
INTRAVENOUS | Status: DC | PRN
  Administered 2023-01-31: 25 ug/min via INTRAVENOUS

## 2023-01-31 MED ORDER — CEFAZOLIN SODIUM-DEXTROSE 2-3 GM-%(50ML) IV SOLR
INTRAVENOUS | Status: DC | PRN
Start: 2023-01-31 — End: 2023-01-31
  Administered 2023-01-31: 2 g via INTRAVENOUS

## 2023-01-31 MED ORDER — THROMBIN 20000 UNITS EX SOLR: CUTANEOUS | Status: DC | PRN

## 2023-01-31 MED ORDER — BUPIVACAINE-EPINEPHRINE (PF) 0.5% -1:200000 IJ SOLN
INTRAMUSCULAR | Status: AC
  Filled 2023-01-31: qty 30

## 2023-01-31 MED ORDER — FENTANYL CITRATE (PF) 100 MCG/2ML IJ SOLN: 25.0000 ug | INTRAMUSCULAR | Status: DC | PRN

## 2023-01-31 MED ORDER — MICROFIBRILLAR COLL HEMOSTAT EX POWD
CUTANEOUS | Status: AC
  Filled 2023-01-31: qty 5

## 2023-01-31 SURGICAL SUPPLY — 82 items
BAG COUNTER SPONGE SURGICOUNT (BAG) ×2 IMPLANT
BAG SPNG CNTER NS LX DISP (BAG) ×2
BIT DRILL WIRE PASS 1.3MM (BIT) IMPLANT
BLADE CLIPPER SPEC (BLADE) IMPLANT
BLADE ULTRA TIP 2M (BLADE) IMPLANT
BUR PRECISION FLUTE 6.0 (BURR) ×2 IMPLANT
BUR SPIRAL ROUTER 2.3 (BUR) IMPLANT
CANISTER SUCT 3000ML PPV (MISCELLANEOUS) ×4 IMPLANT
CASSETTE SUCT IRRIG SONOPET IQ (MISCELLANEOUS) IMPLANT
CATH VENTRIC 35X38 W/TROCAR LG (CATHETERS) IMPLANT
CLIP RANEY DISP (INSTRUMENTS) IMPLANT
CLIP TI MEDIUM 6 (CLIP) IMPLANT
CNTNR URN SCR LID CUP LEK RST (MISCELLANEOUS) ×2 IMPLANT
CONT SPEC 4OZ STRL OR WHT (MISCELLANEOUS) ×2
COVER BACK TABLE 60X90IN (DRAPES) IMPLANT
COVERAGE SUPPORT O-ARM STEALTH (MISCELLANEOUS) ×2 IMPLANT
DRAIN RELI 100 BL SUC LF ST (DRAIN)
DRAPE MICROSCOPE SLANT 54X150 (MISCELLANEOUS) ×2 IMPLANT
DRAPE NEUROLOGICAL W/INCISE (DRAPES) ×2 IMPLANT
DRAPE SURG 17X23 STRL (DRAPES) IMPLANT
DRAPE WARM FLUID 44X44 (DRAPES) ×2 IMPLANT
DRILL WIRE PASS 1.3MM (BIT)
ELECT REM PT RETURN 9FT ADLT (ELECTROSURGICAL) ×2
ELECTRODE REM PT RTRN 9FT ADLT (ELECTROSURGICAL) ×2 IMPLANT
EVACUATOR 1/8 PVC DRAIN (DRAIN) IMPLANT
EVACUATOR SILICONE 100CC (DRAIN) IMPLANT
FEE COVERAGE SUPPORT O-ARM (MISCELLANEOUS) ×2 IMPLANT
FORCEPS BIPOLAR SPETZLER 8 1.0 (NEUROSURGERY SUPPLIES) IMPLANT
GAUZE 4X4 16PLY ~~LOC~~+RFID DBL (SPONGE) IMPLANT
GAUZE SPONGE 4X4 12PLY STRL (GAUZE/BANDAGES/DRESSINGS) IMPLANT
GLOVE BIO SURGEON STRL SZ 6.5 (GLOVE) ×2 IMPLANT
GLOVE BIO SURGEON STRL SZ7 (GLOVE) IMPLANT
GLOVE BIO SURGEON STRL SZ8 (GLOVE) ×4 IMPLANT
GLOVE BIO SURGEON STRL SZ8.5 (GLOVE) ×4 IMPLANT
GLOVE BIOGEL PI IND STRL 6.5 (GLOVE) ×2 IMPLANT
GLOVE BIOGEL PI IND STRL 7.5 (GLOVE) IMPLANT
GLOVE EXAM NITRILE XL STR (GLOVE) IMPLANT
GOWN STRL REUS W/ TWL LRG LVL3 (GOWN DISPOSABLE) IMPLANT
GOWN STRL REUS W/ TWL XL LVL3 (GOWN DISPOSABLE) ×2 IMPLANT
GOWN STRL REUS W/TWL LRG LVL3 (GOWN DISPOSABLE) ×4
GOWN STRL REUS W/TWL XL LVL3 (GOWN DISPOSABLE) ×2
HEMOSTAT POWDER KIT SURGIFOAM (HEMOSTASIS) ×2 IMPLANT
HEMOSTAT SURGICEL 2X14 (HEMOSTASIS) ×2 IMPLANT
KIT BASIN OR (CUSTOM PROCEDURE TRAY) ×2 IMPLANT
KIT DRAIN CSF ACCUDRAIN (MISCELLANEOUS) IMPLANT
KIT TURNOVER KIT B (KITS) ×2 IMPLANT
MARKER SKIN DUAL TIP RULER LAB (MISCELLANEOUS) IMPLANT
MARKER SPHERE PSV REFLC NDI (MISCELLANEOUS) IMPLANT
NDL HYPO 22X1.5 SAFETY MO (MISCELLANEOUS) ×2 IMPLANT
NEEDLE HYPO 22X1.5 SAFETY MO (MISCELLANEOUS) ×2 IMPLANT
NS IRRIG 1000ML POUR BTL (IV SOLUTION) ×2 IMPLANT
PACK BATTERY CMF DISP FOR DVR (ORTHOPEDIC DISPOSABLE SUPPLIES) ×2 IMPLANT
PACK CRANIOTOMY CUSTOM (CUSTOM PROCEDURE TRAY) ×2 IMPLANT
PAD ARMBOARD 7.5X6 YLW CONV (MISCELLANEOUS) ×2 IMPLANT
PATTIES SURGICAL .25X.25 (GAUZE/BANDAGES/DRESSINGS) IMPLANT
PATTIES SURGICAL .5 X.5 (GAUZE/BANDAGES/DRESSINGS) IMPLANT
PATTIES SURGICAL .5 X3 (DISPOSABLE) IMPLANT
PATTIES SURGICAL 1X1 (DISPOSABLE) IMPLANT
PIN MAYFIELD SKULL DISP (PIN) ×2 IMPLANT
PLATE CRANIAL 12 2H RIGID UNI (Plate) IMPLANT
SCREW UNIII AXS SD 1.5X4 (Screw) IMPLANT
SPECIMEN JAR SMALL (MISCELLANEOUS) IMPLANT
SPONGE NEURO XRAY DETECT 1X3 (DISPOSABLE) IMPLANT
SPONGE SURGIFOAM ABS GEL 100 (HEMOSTASIS) ×2 IMPLANT
STAPLER SKIN PROX WIDE 3.9 (STAPLE) ×2 IMPLANT
STOCKINETTE 6 STRL (DRAPES) IMPLANT
SUT ETHILON 3 0 FSL (SUTURE) IMPLANT
SUT ETHILON 3 0 PS 1 (SUTURE) IMPLANT
SUT NURALON 4 0 TR CR/8 (SUTURE) ×4 IMPLANT
SUT PROLENE 6 0 BV (SUTURE) IMPLANT
SUT SILK 0 TIES 10X30 (SUTURE) IMPLANT
SUT VIC AB 2-0 CP2 18 (SUTURE) ×2 IMPLANT
SUT VIC AB 3-0 FS2 27 (SUTURE) IMPLANT
SUT VICRYL 4-0 PS2 18IN ABS (SUTURE) IMPLANT
TIP TISSUE SONOPET IQ STD 12 (TIP) IMPLANT
TOWEL GREEN STERILE (TOWEL DISPOSABLE) ×2 IMPLANT
TOWEL GREEN STERILE FF (TOWEL DISPOSABLE) ×2 IMPLANT
TRAY FOLEY MTR SLVR 16FR STAT (SET/KITS/TRAYS/PACK) ×2 IMPLANT
TUBE CONNECTING 12X1/4 (SUCTIONS) IMPLANT
TUBING FEATHERFLOW (TUBING) IMPLANT
UNDERPAD 30X36 HEAVY ABSORB (UNDERPADS AND DIAPERS) IMPLANT
WATER STERILE IRR 1000ML POUR (IV SOLUTION) ×2 IMPLANT

## 2023-01-31 NOTE — Op Note (Signed)
Brief history: The patient is a 72 year old white male who presented to the ER with a brain tumor.  He was worked up with a brain MRI which demonstrated a right temporal brain tumor with a large cyst.  I discussed the various treatment options with the patient and his wife.  He has decided proceed with surgery.  Preop diagnosis right temporal brain tumor  Postop diagnosis: The same  Procedure: Right temporal craniotomy for gross total resection of right temporal brain tumor using microdissection and Stealth frameless stereotaxy  Surgeon: Dr. Delma Officer  Assistant: Dr. Hoyt Koch  Anesthesia: General Tracheal  Estimated blood loss: 25 cc cc  Specimens: 2 where  Drains: None  Complications: None  Description of procedure: The patient was brought to the operating room by the anesthesia team.  General endotracheal anesthesia was induced.  The patient remained in the supine position.  A roll was placed on his right shoulder.  I applied the Mayfield 3 point headrest to his calvarium.  His head was turned to the left exposing his right scalp.  The surface coordinates were entered into the Stealth computer.  The patient's right scalp was then shaved with clippers and prepared with Betadine scrub and Betadine solution.  Sterile drapes were applied.  I then injected the area to be incised with Marcaine with epinephrine solution.  I used a scalpel to make a u shaped incision just above the patient's right ear.  I used Raney clips for wound edge hemostasis.  I then selected cautery to divide the temporalis fascia and muscle.  I used the periosteal elevators and electrocautery to expose the underlying calvarium.  I used a towel clamp and rubber bands for exposure.  I then used a high-speed drill to create a right temporal bur hole.  I used the footplate device to create a right temporal craniotomy flap.  I elevated the craniotomy flap with the Penfield #1.  This exposed alone dura.  I then sized  the dura with a 15 blade scalpel.  I used the Metzenbaum scissors to incise the dura and in a  U-shaped fashion.  I tacked up the dural edges.  I used the Stealth neuronavigation to confirm the craniotomy flap was centered over the tumor.  I then used bipolar cautery to create a small corticotomy over the tumor.  I used suction and irrigation to dissect deeper and immediately countered the tumor.  We obtained multiple specimens.  We used the [employer] for exposure.  We brought the operative microscope into the field and under its magnification and illumination we completed tumor resection/microdissection.  I dissected deeper with bipolar cautery and suction.  We entered into the cyst cavity and drained it.  Using the sono PET, suction, bipolar cautery, etc. I was able to achieve what appeared to be a gross total resection of the tumor.  I then obtained hemostasis using bipolar cautery and Avitene.  I can stay out the Avitene and then lined the tumor resection cavity with Surgicel.  I then reapproximated the patient's dura with interrupted 4-0 Nurolon suture.  I placed Gelfoam over the durotomy.  I then reapproximated the patient's craniotomy flap with titanium mini plates and screws.  I then removed the retractor and reapproximated the patient's temporalis fascia and muscle and galea with interrupted 2-0 Vicryl suture.  I reapproximated the skin with stainless steel staples.  The wound was then coated with bacitracin ointment.  A sterile dressing was applied.  The drapes were removed.  I then removed the Mayfield 3 point headrest from the patient's calvarium.  By report all sponge, instrument, and needle counts were correct at the end of this case.

## 2023-01-31 NOTE — Progress Notes (Signed)
CCC Pre-op Review  Pre-op checklist: asked floor rn to complete  NPO: since MN, sips with meds  Labs: WNL  Consent: completed  H&P: 8/21  Vitals: WNL  O2 requirements: 94% 2LNC  MAR/PTA review: completed  IV: 20g LFA  Floor nurse name:  Ephriam Knuckles, RN 4N  Additional info:

## 2023-01-31 NOTE — Anesthesia Postprocedure Evaluation (Signed)
Anesthesia Post Note  Patient: Juan Garrett  Procedure(s) Performed: CRANIOTOMY TUMOR EXCISION (Right: Head) APPLICATION OF CRANIAL NAVIGATION     Patient location during evaluation: PACU Anesthesia Type: General Level of consciousness: awake and alert Pain management: pain level controlled Vital Signs Assessment: post-procedure vital signs reviewed and stable Respiratory status: spontaneous breathing, nonlabored ventilation, respiratory function stable and patient connected to nasal cannula oxygen Cardiovascular status: blood pressure returned to baseline and stable Postop Assessment: no apparent nausea or vomiting Anesthetic complications: no  No notable events documented.  Last Vitals:  Vitals:   01/31/23 2130 01/31/23 2145  BP: 112/72 98/65  Pulse: 87 80  Resp: 17 (!) 5  Temp:    SpO2: 93% 94%    Last Pain:  Vitals:   01/31/23 2120  TempSrc:   PainSc: 0-No pain    LLE Motor Response: Purposeful movement (01/31/23 2145)   RLE Motor Response: Purposeful movement (01/31/23 2145)        Beth Spackman S

## 2023-01-31 NOTE — Progress Notes (Signed)
   01/31/23 1050  Spiritual Encounters  Type of Visit Initial  Care provided to: Pt and family  Conversation partners present during encounter Nurse  Referral source Patient request  Reason for visit Advance directives  OnCall Visit No   Chaplain responding to Pt request for information and answers regarding a living will and the HCPOA.  Chaplain provided education and answered questions both the Pt and his wife had.  Left paperwork with them and explained how to contact spiritual care if they have questions and when they are ready to proceed. Pt also expressed his Juan Garrett faith and asked for prayer.  Chaplain provided prayer before excusing himself.  Chaplain services remain available by Spiritual Consult or for emergent cases, paging [phone]  Chaplain Raelene Bott, MDiv [email or web address] [phone]

## 2023-01-31 NOTE — Progress Notes (Signed)
Subjective: The patient is alert and pleasant.  His wife is at the bedside.  Objective: Vital signs in last 24 hours: Temp:  [97.8 F (36.6 C)-98.2 F (36.8 C)] 97.9 F (36.6 C) (08/22 1514) Pulse Rate:  [59-107] 90 (08/22 1514) Resp:  [12-20] 18 (08/22 1514) BP: (97-134)/(53-99) 100/62 (08/22 1514) SpO2:  [85 %-99 %] 96 % (08/22 1514) Weight:  [78.9 kg-79.8 kg] 79.8 kg (08/22 1514) Estimated body mass index is 28.41 kg/m as calculated from the following:   Height as of this encounter: 5\' 6"  (1.676 m).   Weight as of this encounter: 79.8 kg.   Intake/Output from previous day: 08/21 0701 - 08/22 0700 In: -  Out: 300 [Urine:300] Intake/Output this shift: Total I/O In: -  Out: 450 [Urine:450]  Physical exam the patient is alert and oriented.  Lab Results: Recent Labs    01/30/23 1233  WBC 8.7  HGB 15.0  HCT 44.2  PLT 256   BMET Recent Labs    01/30/23 1233  NA 141  K 3.9  CL 103  CO2 23  GLUCOSE 111*  BUN 22  CREATININE 1.12  CALCIUM 9.6    Studies/Results: MR BRAIN W WO CONTRAST  Result Date: 01/30/2023 CLINICAL DATA:  Provided history: Brain/CNS neoplasm, staging EXAM: MRI HEAD WITHOUT AND WITH CONTRAST TECHNIQUE: Multiplanar, multiecho pulse sequences of the brain and surrounding structures were obtained without and with intravenous contrast. CONTRAST:  10 mL Gadavist intravenous contrast COMPARISON:  Head CT 01/30/2023. FINDINGS: Brain: No age advanced or lobar predominant parenchymal atrophy. 6.8 x 5.5 x 5.1 cm predominantly cystic/necrotic mass in the right cerebral hemisphere, centered within the right temporal lobe. Pre-contrast T1 hyperintensity and diffusion-weighted signal hyperintensity within the cystic/necrotic component of the mass posteriorly, likely reflecting non-acute blood products. Curvilinear and irregular enhancement surrounding the cystic/necrotic component of the mass, measuring up to 1.3 cm in thickness. Moderate surrounding T2 FLAIR  hyperintense parenchymal signal abnormality, which may reflect edema and/or non-enhancing infiltrative tumor. Mass effect with near complete effacement of the right lateral ventricle and 8 mm leftward midline shift. Additionally, there is a degree of right uncal herniation with partial effacement of the basal cisterns on the right and mass effect upon the right aspect of the midbrain. 1-2 mm nodular enhancing lesion within the right internal auditory canal (series 13, image 24). Multifocal T2 FLAIR hyperintense signal abnormality elsewhere within the cerebral white matter, nonspecific but compatible with mild chronic small vessel ischemic disease. There is no acute infarct. No extra-axial fluid collection. Vascular: Maintained flow voids within the proximal large arterial vessels. Skull and upper cervical spine: No focal suspicious marrow lesion. Incompletely assessed cervical spondylosis. Sinuses/Orbits: No mass or acute finding within the imaged orbits. Minimal mucosal thickening within the left frontal and bilateral ethmoid sinuses. IMPRESSION: 1. 6.8 x 5.5 x 5.1 cm predominantly cystic/necrotic mass within the right cerebral hemisphere, centered within the right temporal lobe, as described. This is favored to reflect a primary CNS neoplasm (such as a high-grade glioma), although a metastasis cannot be excluded. Neurosurgery and neuro-oncology consultation recommended. Mass effect with 8 mm leftward midline shift, a degree of right uncal herniation (with mass effect upon the right aspect of the midbrain) and near complete effacement of the right lateral ventricle. No evidence of ventricular entrapment. 2. 1-2 mm nodular enhancing lesion within the right internal auditory canal, which may reflect a vestibular schwannoma. Again, an intracranial metastasis cannot be excluded. Electronically Signed   By: Jackey Loge D.O.  On: 01/30/2023 17:59   DG Chest Port 1 View  Result Date: 01/30/2023 CLINICAL DATA:  new  brain lesion EXAM: PORTABLE CHEST 1 VIEW COMPARISON:  07/05/2021. FINDINGS: Bilateral lung fields are clear. Bilateral costophrenic angles are clear. Normal cardio-mediastinal silhouette. No acute osseous abnormalities. The soft tissues are within normal limits. IMPRESSION: No active disease. Electronically Signed   By: Jules Schick M.D.   On: 01/30/2023 14:21   CT HEAD WO CONTRAST ( )  Addendum Date: 01/30/2023   ADDENDUM REPORT: 01/30/2023 11:38 ADDENDUM: Study discussed by telephone with Dr. Georgann Housekeeper on 01/30/2023 at 1129 hours. Electronically Signed   By: Odessa Fleming M.D.   On: 01/30/2023 11:38   Result Date: 01/30/2023 CLINICAL DATA:  72 year old male with headache, vertigo for 3 weeks. EXAM: CT HEAD WITHOUT CONTRAST TECHNIQUE: Contiguous axial images were obtained from the base of the skull through the vertex without intravenous contrast. RADIATION DOSE REDUCTION: This exam was performed according to the departmental dose-optimization program which includes automated exposure control, adjustment of the mA and/or kV according to patient size and/or use of iterative reconstruction technique. COMPARISON:  None Available. FINDINGS: Brain: Mixed density but predominantly cystic/fluid density mass in the right cerebral hemisphere. The abnormality expands the right temporal lobe. The fairly circumscribed cystic component encompasses 67 by 40 x 45 mm (AP by transverse by CC), but there is additional regional tumoral edema, and hyperdensity of the parenchyma lateral to the cystic mass is indeterminate for additional tumor versus compressed normal parenchyma. Mass effect on the right lateral ventricle as well as up to 9 mm of leftward midline shift (series 2, image 16). No ventriculomegaly or trapping of the left lateral ventricle. Basilar cisterns remain normal. Left hemisphere and posterior fossa gray-white differentiation is normal. No acute intracranial hemorrhage identified. No cortically based acute  infarct identified. Vascular: Calcified atherosclerosis at the skull base. No suspicious intracranial vascular hyperdensity. Skull: Intact, negative. Sinuses/Orbits: Visualized paranasal sinuses and mastoids are well aerated. Other: Visualized orbits and scalp soft tissues are within normal limits. IMPRESSION: Large nearly 7 cm predominantly cystic mass in the right cerebral hemisphere, epicenter at the temporal lobe. Regional tumoral edema, and associated intracranial mass effect with and up to 9 mm of leftward midline shift. Constellation favors of partially cystic primary brain tumor over solitary metastasis or other differential considerations. Recommend Brain MRI without and with contrast to further characterize. Electronically Signed: By: Odessa Fleming M.D. On: 01/30/2023 11:14    Assessment/Plan: Right brain tumor: I have again discussed situation with the patient and his wife.  I have answered all their questions.  He wants to proceed with surgery.  LOS: 1 day     Cristi Loron 01/31/2023, 5:09 PM

## 2023-01-31 NOTE — Transfer of Care (Signed)
Immediate Anesthesia Transfer of Care Note  Patient: Juan Garrett  Procedure(s) Performed: CRANIOTOMY TUMOR EXCISION (Right: Head) APPLICATION OF CRANIAL NAVIGATION  Patient Location: PACU  Anesthesia Type:General  Level of Consciousness: drowsy and confused  Airway & Oxygen Therapy: Patient Spontanous Breathing  Post-op Assessment: Report given to RN, Post -op Vital signs reviewed and stable, and Patient moving all extremities X 4  Post vital signs: Reviewed and stable  Last Vitals:  Vitals Value Taken Time  BP 136/76 01/31/23 2121  Temp    Pulse 86 01/31/23 2123  Resp 14 01/31/23 2123  SpO2 94 % 01/31/23 2123  Vitals shown include unfiled device data.  Last Pain:  Vitals:   01/31/23 1559  TempSrc:   PainSc: 3       Patients Stated Pain Goal: 1 (01/31/23 1559)  Complications: No notable events documented.

## 2023-01-31 NOTE — Anesthesia Preprocedure Evaluation (Signed)
Anesthesia Evaluation  Patient identified by MRN, date of birth, ID band Patient awake    Reviewed: Allergy & Precautions, NPO status , Patient's Chart, lab work & pertinent test results  History of Anesthesia Complications Negative for: history of anesthetic complications  Airway Mallampati: II  TM Distance: >3 FB Neck ROM: Full    Dental  (+) Teeth Intact, Dental Advisory Given   Pulmonary former smoker   breath sounds clear to auscultation       Cardiovascular hypertension, Pt. on medications (-) angina (-) Past MI and (-) CHF  Rhythm:Regular     Neuro/Psych BRAIN TUMOR    GI/Hepatic Neg liver ROS,GERD  Medicated and Controlled,,  Endo/Other  negative endocrine ROS    Renal/GU negative Renal ROS     Musculoskeletal  (+) Arthritis ,    Abdominal   Peds  Hematology negative hematology ROS (+) Lab Results      Component                Value               Date                      WBC                      8.7                 01/30/2023                HGB                      15.0                01/30/2023                HCT                      44.2                01/30/2023                MCV                      84.0                01/30/2023                PLT                      256                 01/30/2023              Anesthesia Other Findings   Reproductive/Obstetrics                             Anesthesia Physical Anesthesia Plan  ASA: 3  Anesthesia Plan: General   Post-op Pain Management: Ofirmev IV (intra-op)*   Induction: Intravenous  PONV Risk Score and Plan: 2 and Ondansetron, Dexamethasone and Treatment may vary due to age or medical condition  Airway Management Planned: Oral ETT  Additional Equipment: Arterial line  Intra-op Plan:   Post-operative Plan: Extubation in OR  Informed Consent: I have reviewed the patients History and Physical, chart, labs and  discussed the procedure including the  risks, benefits and alternatives for the proposed anesthesia with the patient or authorized representative who has indicated his/her understanding and acceptance.     Dental advisory given  Plan Discussed with: CRNA  Anesthesia Plan Comments:        Anesthesia Quick Evaluation

## 2023-01-31 NOTE — Progress Notes (Signed)
Subjective: The patient is alert and pleasant.  He is in no apparent distress.  Objective: Vital signs in last 24 hours: Temp:  [97.8 F (36.6 C)-98.2 F (36.8 C)] 97.9 F (36.6 C) (08/22 1514) Pulse Rate:  [59-107] 90 (08/22 1514) Resp:  [12-20] 18 (08/22 1514) BP: (97-134)/(53-99) 100/62 (08/22 1514) SpO2:  [85 %-97 %] 96 % (08/22 1514) Weight:  [78.9 kg-79.8 kg] 79.8 kg (08/22 1514) Estimated body mass index is 28.41 kg/m as calculated from the following:   Height as of this encounter: 5\' 6"  (1.676 m).   Weight as of this encounter: 79.8 kg.   Intake/Output from previous day: 08/21 0701 - 08/22 0700 In: -  Out: 300 [Urine:300] Intake/Output this shift: Total I/O In: 1700 [I.V.:1600; IV Piggyback:100] Out: 375 [Urine:175; Blood:200]  Physical exam the patient is alert and pleasant.  He is moving all 4 extremities well.  His speech is normal.  Lab Results: Recent Labs    01/30/23 1233  WBC 8.7  HGB 15.0  HCT 44.2  PLT 256   BMET Recent Labs    01/30/23 1233  NA 141  K 3.9  CL 103  CO2 23  GLUCOSE 111*  BUN 22  CREATININE 1.12  CALCIUM 9.6    Studies/Results: MR BRAIN W WO CONTRAST  Result Date: 01/30/2023 CLINICAL DATA:  Provided history: Brain/CNS neoplasm, staging EXAM: MRI HEAD WITHOUT AND WITH CONTRAST TECHNIQUE: Multiplanar, multiecho pulse sequences of the brain and surrounding structures were obtained without and with intravenous contrast. CONTRAST:  10 mL Gadavist intravenous contrast COMPARISON:  Head CT 01/30/2023. FINDINGS: Brain: No age advanced or lobar predominant parenchymal atrophy. 6.8 x 5.5 x 5.1 cm predominantly cystic/necrotic mass in the right cerebral hemisphere, centered within the right temporal lobe. Pre-contrast T1 hyperintensity and diffusion-weighted signal hyperintensity within the cystic/necrotic component of the mass posteriorly, likely reflecting non-acute blood products. Curvilinear and irregular enhancement surrounding the  cystic/necrotic component of the mass, measuring up to 1.3 cm in thickness. Moderate surrounding T2 FLAIR hyperintense parenchymal signal abnormality, which may reflect edema and/or non-enhancing infiltrative tumor. Mass effect with near complete effacement of the right lateral ventricle and 8 mm leftward midline shift. Additionally, there is a degree of right uncal herniation with partial effacement of the basal cisterns on the right and mass effect upon the right aspect of the midbrain. 1-2 mm nodular enhancing lesion within the right internal auditory canal (series 13, image 24). Multifocal T2 FLAIR hyperintense signal abnormality elsewhere within the cerebral white matter, nonspecific but compatible with mild chronic small vessel ischemic disease. There is no acute infarct. No extra-axial fluid collection. Vascular: Maintained flow voids within the proximal large arterial vessels. Skull and upper cervical spine: No focal suspicious marrow lesion. Incompletely assessed cervical spondylosis. Sinuses/Orbits: No mass or acute finding within the imaged orbits. Minimal mucosal thickening within the left frontal and bilateral ethmoid sinuses. IMPRESSION: 1. 6.8 x 5.5 x 5.1 cm predominantly cystic/necrotic mass within the right cerebral hemisphere, centered within the right temporal lobe, as described. This is favored to reflect a primary CNS neoplasm (such as a high-grade glioma), although a metastasis cannot be excluded. Neurosurgery and neuro-oncology consultation recommended. Mass effect with 8 mm leftward midline shift, a degree of right uncal herniation (with mass effect upon the right aspect of the midbrain) and near complete effacement of the right lateral ventricle. No evidence of ventricular entrapment. 2. 1-2 mm nodular enhancing lesion within the right internal auditory canal, which may reflect a vestibular schwannoma. Again,  an intracranial metastasis cannot be excluded. Electronically Signed   By: Jackey Loge D.O.   On: 01/30/2023 17:59   DG Chest Port 1 View  Result Date: 01/30/2023 CLINICAL DATA:  new brain lesion EXAM: PORTABLE CHEST 1 VIEW COMPARISON:  07/05/2021. FINDINGS: Bilateral lung fields are clear. Bilateral costophrenic angles are clear. Normal cardio-mediastinal silhouette. No acute osseous abnormalities. The soft tissues are within normal limits. IMPRESSION: No active disease. Electronically Signed   By: Jules Schick M.D.   On: 01/30/2023 14:21   CT HEAD WO CONTRAST ( )  Addendum Date: 01/30/2023   ADDENDUM REPORT: 01/30/2023 11:38 ADDENDUM: Study discussed by telephone with Dr. Georgann Housekeeper on 01/30/2023 at 1129 hours. Electronically Signed   By: Odessa Fleming M.D.   On: 01/30/2023 11:38   Result Date: 01/30/2023 CLINICAL DATA:  72 year old male with headache, vertigo for 3 weeks. EXAM: CT HEAD WITHOUT CONTRAST TECHNIQUE: Contiguous axial images were obtained from the base of the skull through the vertex without intravenous contrast. RADIATION DOSE REDUCTION: This exam was performed according to the departmental dose-optimization program which includes automated exposure control, adjustment of the mA and/or kV according to patient size and/or use of iterative reconstruction technique. COMPARISON:  None Available. FINDINGS: Brain: Mixed density but predominantly cystic/fluid density mass in the right cerebral hemisphere. The abnormality expands the right temporal lobe. The fairly circumscribed cystic component encompasses 67 by 40 x 45 mm (AP by transverse by CC), but there is additional regional tumoral edema, and hyperdensity of the parenchyma lateral to the cystic mass is indeterminate for additional tumor versus compressed normal parenchyma. Mass effect on the right lateral ventricle as well as up to 9 mm of leftward midline shift (series 2, image 16). No ventriculomegaly or trapping of the left lateral ventricle. Basilar cisterns remain normal. Left hemisphere and posterior fossa  gray-white differentiation is normal. No acute intracranial hemorrhage identified. No cortically based acute infarct identified. Vascular: Calcified atherosclerosis at the skull base. No suspicious intracranial vascular hyperdensity. Skull: Intact, negative. Sinuses/Orbits: Visualized paranasal sinuses and mastoids are well aerated. Other: Visualized orbits and scalp soft tissues are within normal limits. IMPRESSION: Large nearly 7 cm predominantly cystic mass in the right cerebral hemisphere, epicenter at the temporal lobe. Regional tumoral edema, and associated intracranial mass effect with and up to 9 mm of leftward midline shift. Constellation favors of partially cystic primary brain tumor over solitary metastasis or other differential considerations. Recommend Brain MRI without and with contrast to further characterize. Electronically Signed: By: Odessa Fleming M.D. On: 01/30/2023 11:14    Assessment/Plan: The patient is doing well.  I spoke with his family.  LOS: 1 day     Cristi Loron 01/31/2023, 9:28 PM

## 2023-01-31 NOTE — Anesthesia Procedure Notes (Signed)
Procedure Name: Intubation Date/Time: 01/31/2023 6:30 PM  Performed by: Aundria Rud, CRNAPre-anesthesia Checklist: Patient identified, Emergency Drugs available, Suction available and Patient being monitored Patient Re-evaluated:Patient Re-evaluated prior to induction Oxygen Delivery Method: Circle System Utilized Preoxygenation: Pre-oxygenation with 100% oxygen Induction Type: IV induction Ventilation: Mask ventilation without difficulty Laryngoscope Size: Mac and 3 Grade View: Grade I Tube type: Oral Tube size: 7.5 mm Number of attempts: 1 Airway Equipment and Method: Stylet and Oral airway Placement Confirmation: ETT inserted through vocal cords under direct vision, positive ETCO2 and breath sounds checked- equal and bilateral Secured at: 22 cm Tube secured with: Tape Dental Injury: Teeth and Oropharynx as per pre-operative assessment

## 2023-02-01 ENCOUNTER — Inpatient Hospital Stay (HOSPITAL_COMMUNITY): Payer: Medicare Other

## 2023-02-01 ENCOUNTER — Encounter (HOSPITAL_COMMUNITY): Payer: Self-pay | Admitting: Neurosurgery

## 2023-02-01 MED ORDER — GADOBUTROL 1 MMOL/ML IV SOLN
8.0000 mL | Freq: Once | INTRAVENOUS | Status: AC | PRN
  Administered 2023-02-01: 8 mL via INTRAVENOUS

## 2023-02-01 MED ORDER — PHENYLEPHRINE HCL-NACL 20-0.9 MG/250ML-% IV SOLN
INTRAVENOUS | Status: AC
  Filled 2023-02-01: qty 250

## 2023-02-01 NOTE — Progress Notes (Addendum)
Patient off the unit for MRI  1156: Patient back to the unit

## 2023-02-01 NOTE — Plan of Care (Signed)
  Problem: Education: Goal: Knowledge of General Education information will improve Description: Including pain rating scale, medication(s)/side effects and non-pharmacologic comfort measures Outcome: Progressing   Problem: Clinical Measurements: Goal: Respiratory complications will improve Outcome: Progressing Goal: Cardiovascular complication will be avoided Outcome: Progressing   Problem: Activity: Goal: Risk for activity intolerance will decrease Outcome: Progressing   Problem: Nutrition: Goal: Adequate nutrition will be maintained Outcome: Progressing   Problem: Elimination: Goal: Will not experience complications related to urinary retention Outcome: Progressing   Problem: Skin Integrity: Goal: Risk for impaired skin integrity will decrease Outcome: Progressing

## 2023-02-01 NOTE — TOC Initial Note (Signed)
Transition of Care [facility]) - Initial/Assessment Note    Patient Details  Name: Juan Garrett MRN: [id number] Date of Birth: 1950-11-21  [facility] CM/SW Contact:    Kermit Balo, RN Phone Number: 02/01/2023, 3:29 PM  Clinical Narrative:                 Pt lives at home with his spouse. She is with  him most of the time.  Pt states he has all needed DME at home. Pt manages his own medications and denies any issues.  Pts spouse provides needed transportation. TOC following.  Expected Discharge Plan: Home/Self Care Barriers to Discharge: Continued Medical Work up   Patient Goals and CMS Choice            Expected Discharge Plan and Services       Living arrangements for the past 2 months: Single Family Home                                      Prior Living Arrangements/Services Living arrangements for the past 2 months: Single Family Home Lives with:: Spouse Patient language and need for interpreter reviewed:: Yes Do you feel safe going back to the place where you live?: Yes        Care giver support system in place?: Yes (comment)   Criminal Activity/Legal Involvement Pertinent to Current Situation/Hospitalization: No - Comment as needed  Activities of Daily Living Home Assistive Devices/Equipment: None ADL Screening (condition at time of admission) Patient's cognitive ability adequate to safely complete daily activities?: Yes Is the patient deaf or have difficulty hearing?: No Does the patient have difficulty seeing, even when wearing glasses/contacts?: No Does the patient have difficulty concentrating, remembering, or making decisions?: No Patient able to express need for assistance with ADLs?: Yes Does the patient have difficulty dressing or bathing?: No Independently performs ADLs?: Yes (appropriate for developmental age) Does the patient have difficulty walking or climbing stairs?: Yes Weakness of Legs: Both Weakness of  Arms/Hands: Left  Permission Sought/Granted                  Emotional Assessment Appearance:: Appears stated age Attitude/Demeanor/Rapport: Engaged Affect (typically observed): Accepting Orientation: : Oriented to Self, Oriented to Place, Oriented to  Time, Oriented to Situation   Psych Involvement: No (comment)  Admission diagnosis:  Brain mass [G93.89] Brain lesion [G93.9] Patient Active Problem List   Diagnosis Date Noted   Brain lesion 01/30/2023   Effusion, right knee 10/28/2019   Acute medial meniscal tear, right, initial encounter 09/04/2019   PCP:  Georgann Housekeeper, MD Pharmacy:   [facility] - [place], [place] - [address] [place] [address] Phone: [phone] Fax: [phone]     Social Determinants of Health (SDOH) Social History: SDOH Screenings   Food Insecurity: No Food Insecurity (02/01/2023)  Housing: Patient Declined (02/01/2023)  Transportation Needs: No Transportation Needs (02/01/2023)  Utilities: Not At Risk (02/01/2023)  Tobacco Use: Medium Risk (01/31/2023)   SDOH Interventions:     Readmission Risk Interventions     No data to display

## 2023-02-01 NOTE — Progress Notes (Signed)
Subjective: The patient is alert and pleasant.  He looks well.  He has no complaints.  Objective: Vital signs in last 24 hours: Temp:  [97.9 F (36.6 C)-98.4 F (36.9 C)] 98 F (36.7 C) (08/23 0400) Pulse Rate:  [67-107] 76 (08/23 0600) Resp:  [5-20] 13 (08/23 0600) BP: (98-136)/(53-99) 114/76 (08/23 0600) SpO2:  [85 %-98 %] 96 % (08/23 0600) Arterial Line BP: (120-152)/(50-69) 140/65 (08/23 0600) Weight:  [79.8 kg] 79.8 kg (08/22 1514) Estimated body mass index is 28.41 kg/m as calculated from the following:   Height as of this encounter: 5\' 6"  (1.676 m).   Weight as of this encounter: 79.8 kg.   Intake/Output from previous day: 08/22 0701 - 08/23 0700 In: 1700 [I.V.:1600; IV Piggyback:100] Out: 1225 [Urine:1025; Blood:200] Intake/Output this shift: Total I/O In: 1700 [I.V.:1600; IV Piggyback:100] Out: 775 [Urine:575; Blood:200]  Physical exam the patient is alert and oriented.  His speech is normal.  His strength is normal.  Lab Results: Recent Labs    01/30/23 1233  WBC 8.7  HGB 15.0  HCT 44.2  PLT 256   BMET Recent Labs    01/30/23 1233  NA 141  K 3.9  CL 103  CO2 23  GLUCOSE 111*  BUN 22  CREATININE 1.12  CALCIUM 9.6    Studies/Results: MR BRAIN W WO CONTRAST  Result Date: 01/30/2023 CLINICAL DATA:  Provided history: Brain/CNS neoplasm, staging EXAM: MRI HEAD WITHOUT AND WITH CONTRAST TECHNIQUE: Multiplanar, multiecho pulse sequences of the brain and surrounding structures were obtained without and with intravenous contrast. CONTRAST:  10 mL Gadavist intravenous contrast COMPARISON:  Head CT 01/30/2023. FINDINGS: Brain: No age advanced or lobar predominant parenchymal atrophy. 6.8 x 5.5 x 5.1 cm predominantly cystic/necrotic mass in the right cerebral hemisphere, centered within the right temporal lobe. Pre-contrast T1 hyperintensity and diffusion-weighted signal hyperintensity within the cystic/necrotic component of the mass posteriorly, likely  reflecting non-acute blood products. Curvilinear and irregular enhancement surrounding the cystic/necrotic component of the mass, measuring up to 1.3 cm in thickness. Moderate surrounding T2 FLAIR hyperintense parenchymal signal abnormality, which may reflect edema and/or non-enhancing infiltrative tumor. Mass effect with near complete effacement of the right lateral ventricle and 8 mm leftward midline shift. Additionally, there is a degree of right uncal herniation with partial effacement of the basal cisterns on the right and mass effect upon the right aspect of the midbrain. 1-2 mm nodular enhancing lesion within the right internal auditory canal (series 13, image 24). Multifocal T2 FLAIR hyperintense signal abnormality elsewhere within the cerebral white matter, nonspecific but compatible with mild chronic small vessel ischemic disease. There is no acute infarct. No extra-axial fluid collection. Vascular: Maintained flow voids within the proximal large arterial vessels. Skull and upper cervical spine: No focal suspicious marrow lesion. Incompletely assessed cervical spondylosis. Sinuses/Orbits: No mass or acute finding within the imaged orbits. Minimal mucosal thickening within the left frontal and bilateral ethmoid sinuses. IMPRESSION: 1. 6.8 x 5.5 x 5.1 cm predominantly cystic/necrotic mass within the right cerebral hemisphere, centered within the right temporal lobe, as described. This is favored to reflect a primary CNS neoplasm (such as a high-grade glioma), although a metastasis cannot be excluded. Neurosurgery and neuro-oncology consultation recommended. Mass effect with 8 mm leftward midline shift, a degree of right uncal herniation (with mass effect upon the right aspect of the midbrain) and near complete effacement of the right lateral ventricle. No evidence of ventricular entrapment. 2. 1-2 mm nodular enhancing lesion within the right internal auditory  canal, which may reflect a vestibular schwannoma.  Again, an intracranial metastasis cannot be excluded. Electronically Signed   By: Jackey Loge D.O.   On: 01/30/2023 17:59   DG Chest Port 1 View  Result Date: 01/30/2023 CLINICAL DATA:  new brain lesion EXAM: PORTABLE CHEST 1 VIEW COMPARISON:  07/05/2021. FINDINGS: Bilateral lung fields are clear. Bilateral costophrenic angles are clear. Normal cardio-mediastinal silhouette. No acute osseous abnormalities. The soft tissues are within normal limits. IMPRESSION: No active disease. Electronically Signed   By: Jules Schick M.D.   On: 01/30/2023 14:21   CT HEAD WO CONTRAST ( )  Addendum Date: 01/30/2023   ADDENDUM REPORT: 01/30/2023 11:38 ADDENDUM: Study discussed by telephone with Dr. Georgann Housekeeper on 01/30/2023 at 1129 hours. Electronically Signed   By: Odessa Fleming M.D.   On: 01/30/2023 11:38   Result Date: 01/30/2023 CLINICAL DATA:  72 year old male with headache, vertigo for 3 weeks. EXAM: CT HEAD WITHOUT CONTRAST TECHNIQUE: Contiguous axial images were obtained from the base of the skull through the vertex without intravenous contrast. RADIATION DOSE REDUCTION: This exam was performed according to the departmental dose-optimization program which includes automated exposure control, adjustment of the mA and/or kV according to patient size and/or use of iterative reconstruction technique. COMPARISON:  None Available. FINDINGS: Brain: Mixed density but predominantly cystic/fluid density mass in the right cerebral hemisphere. The abnormality expands the right temporal lobe. The fairly circumscribed cystic component encompasses 67 by 40 x 45 mm (AP by transverse by CC), but there is additional regional tumoral edema, and hyperdensity of the parenchyma lateral to the cystic mass is indeterminate for additional tumor versus compressed normal parenchyma. Mass effect on the right lateral ventricle as well as up to 9 mm of leftward midline shift (series 2, image 16). No ventriculomegaly or trapping of the left  lateral ventricle. Basilar cisterns remain normal. Left hemisphere and posterior fossa gray-white differentiation is normal. No acute intracranial hemorrhage identified. No cortically based acute infarct identified. Vascular: Calcified atherosclerosis at the skull base. No suspicious intracranial vascular hyperdensity. Skull: Intact, negative. Sinuses/Orbits: Visualized paranasal sinuses and mastoids are well aerated. Other: Visualized orbits and scalp soft tissues are within normal limits. IMPRESSION: Large nearly 7 cm predominantly cystic mass in the right cerebral hemisphere, epicenter at the temporal lobe. Regional tumoral edema, and associated intracranial mass effect with and up to 9 mm of leftward midline shift. Constellation favors of partially cystic primary brain tumor over solitary metastasis or other differential considerations. Recommend Brain MRI without and with contrast to further characterize. Electronically Signed: By: Odessa Fleming M.D. On: 01/30/2023 11:14    Assessment/Plan: Postop day 1: The patient is doing well.  We will DC his A-line and mobilize him.  He can be transferred to progressive unit.  I will get a postoperative/follow-up brain MRI.  He will likely go home tomorrow.  I have answered all his questions.  LOS: 2 days     Cristi Loron 02/01/2023, 6:56 AM     Patient ID: Juan Garrett, male   DOB: 1950/12/05, 72 y.o.   MRN: [id number]

## 2023-02-02 MED ORDER — LEVETIRACETAM 500 MG PO TABS: 500.0000 mg | ORAL_TABLET | Freq: Two times a day (BID) | ORAL | 0 refills | Status: DC

## 2023-02-02 MED ORDER — HYDROCODONE-ACETAMINOPHEN 5-325 MG PO TABS: 1.0000 | ORAL_TABLET | ORAL | 0 refills | Status: DC | PRN

## 2023-02-02 MED ORDER — ONDANSETRON HCL 4 MG PO TABS: 4.0000 mg | ORAL_TABLET | Freq: Three times a day (TID) | ORAL | 1 refills | Status: DC | PRN

## 2023-02-02 MED ORDER — HYDROCODONE-ACETAMINOPHEN 5-325 MG PO TABS: 1.0000 | ORAL_TABLET | Freq: Four times a day (QID) | ORAL | Status: DC | PRN

## 2023-02-02 NOTE — Progress Notes (Signed)
Patient discharged, dropped to main entrance A to spouse' car

## 2023-02-02 NOTE — Discharge Summary (Signed)
Physician Discharge Summary  Patient ID: Juan Garrett MRN: [id number] DOB/AGE: 73/16/1952 72 y.o.  Admit date: 01/30/2023 Discharge date: 02/02/2023  Admission Diagnoses: right temporal brain tumor     Discharge Diagnoses: same   Discharged Condition: good  Hospital Course: The patient was admitted on 01/30/2023 and taken to the operating room where the patient underwent crani for resection of right temporal brain tumor. The patient tolerated the procedure well and was taken to the recovery room and then to the ICU in stable condition. The hospital course was routine. There were no complications. The wound remained clean dry and intact. Pt had appropriate head soreness. No complaints of new pain or new N/T/W. The patient remained afebrile with stable vital signs, and tolerated a regular diet. The patient continued to increase activities, and pain was well controlled with oral pain medications.   Consults: None  Significant Diagnostic Studies:  Results for orders placed or performed during the hospital encounter of 01/30/23  Surgical pcr screen   Specimen: Nasal Mucosa; Nasal Swab  Result Value Ref Range   MRSA, PCR NEGATIVE NEGATIVE   Staphylococcus aureus NEGATIVE NEGATIVE  Comprehensive metabolic panel  Result Value Ref Range   Sodium 141 135 - 145 mmol/L   Potassium 3.9 3.5 - 5.1 mmol/L   Chloride 103 98 - 111 mmol/L   CO2 23 22 - 32 mmol/L   Glucose, Bld 111 (H) 70 - 99 mg/dL   BUN 22 8 - 23 mg/dL   Creatinine, Ser 4.09 0.61 - 1.24 mg/dL   Calcium 9.6 8.9 - 81.1 mg/dL   Total Protein 6.8 6.5 - 8.1 g/dL   Albumin 3.8 3.5 - 5.0 g/dL   AST 19 15 - 41 U/L   ALT 22 0 - 44 U/L   Alkaline Phosphatase 50 38 - 126 U/L   Total Bilirubin 0.6 0.3 - 1.2 mg/dL   GFR, Estimated >91 >47 mL/min   Anion gap 15 5 - 15  CBC  Result Value Ref Range   WBC 8.7 4.0 - 10.5 K/uL   RBC 5.26 4.22 - 5.81 MIL/uL   Hemoglobin 15.0 13.0 - 17.0 g/dL   HCT 82.9 56.2 - 13.0 %   MCV 84.0 80.0  - 100.0 fL   MCH 28.5 26.0 - 34.0 pg   MCHC 33.9 30.0 - 36.0 g/dL   RDW 86.5 78.4 - 69.6 %   Platelets 256 150 - 400 K/uL   nRBC 0.0 0.0 - 0.2 %  Protime-INR  Result Value Ref Range   Prothrombin Time 12.7 11.4 - 15.2 seconds   INR 0.9 0.8 - 1.2  CBG monitoring, ED  Result Value Ref Range   Glucose-Capillary 115 (H) 70 - 99 mg/dL  Type and screen [facility]  Result Value Ref Range   ABO/RH(D) A NEG    Antibody Screen NEG    Sample Expiration      02/03/2023,2359 Performed at [facility], [address] [place], [place] [address]   ABO/Rh  Result Value Ref Range   ABO/RH(D)      A NEG Performed at [facility], [address] [place], [place] [address]     MR BRAIN W WO CONTRAST  Result Date: 02/01/2023 CLINICAL DATA:  Brain/CNS neoplasm, monitor. Postop day 1 following resection of a right temporal brain tumor. EXAM: MRI HEAD WITHOUT AND WITH CONTRAST TECHNIQUE: Multiplanar, multiecho pulse sequences of the brain and surrounding structures were obtained without and with intravenous contrast.  CONTRAST:  8mL GADAVIST GADOBUTROL 1 MMOL/ML IV SOLN COMPARISON:  Head MRI 01/30/2023 FINDINGS: Brain: Sequelae of interval right temporal craniotomy are identified for tumor resection. There is a resection cavity in the right temporal lobe with associated blood products, and there is mild bilateral pneumocephalus. Incomplete enhancement is noted along the margins of the resection cavity measuring up to 5 mm in thickness. A small amount of restricted diffusion is noted along the margins of the resection cavity, predominantly superiorly, and may reflect a small amount of perioperative ischemia. There is moderate residual nonenhancing T2 hyperintensity/edema in the right temporal white matter extending into the deep white matter tracts at the level of the basal ganglia. Mass effect has decreased, with 3 mm of residual leftward midline shift (previously 8 mm). There is  decreased effacement of the right lateral ventricle and decreased right uncal herniation. There is no evidence of hydrocephalus. Mild diffuse dural enhancement over both cerebral convexities is considered postoperative, and there are small subdural fluid collections over the cerebral convexities measuring up to 6 mm in thickness on the right and 3 mm on the left without significant mass effect. Scattered small T2 hyperintensities elsewhere in the cerebral white matter bilaterally are unchanged and nonspecific but compatible with mild chronic small vessel ischemic disease. There are tiny chronic bilateral cerebellar infarcts. A 2 mm enhancing focus in the right internal auditory canal was more conspicuous on the prior MRI (series 10, image 15). Vascular: Major intracranial vascular flow voids are preserved. Skull and upper cervical spine: Right temporal craniotomy. No suspicious marrow lesion. Sinuses/Orbits: No acute finding. Other: Fluid and gas in the scalp overlying the craniotomy. IMPRESSION: 1. Postoperative changes from interval right temporal tumor resection with nonspecific enhancement along the margins of the resection cavity. This will serve as a baseline for follow-up examinations. 2. Decreased mass effect with 3 mm of residual leftward midline shift. 3. 2 mm enhancing focus in the right internal auditory canal, potentially a vestibular schwannoma with metastatic disease not excluded pending final pathology of the resected tumor. Electronically Signed   By: Sebastian Ache M.D.   On: 02/01/2023 16:02   MR BRAIN W WO CONTRAST  Result Date: 01/30/2023 CLINICAL DATA:  Provided history: Brain/CNS neoplasm, staging EXAM: MRI HEAD WITHOUT AND WITH CONTRAST TECHNIQUE: Multiplanar, multiecho pulse sequences of the brain and surrounding structures were obtained without and with intravenous contrast. CONTRAST:  10 mL Gadavist intravenous contrast COMPARISON:  Head CT 01/30/2023. FINDINGS: Brain: No age advanced or  lobar predominant parenchymal atrophy. 6.8 x 5.5 x 5.1 cm predominantly cystic/necrotic mass in the right cerebral hemisphere, centered within the right temporal lobe. Pre-contrast T1 hyperintensity and diffusion-weighted signal hyperintensity within the cystic/necrotic component of the mass posteriorly, likely reflecting non-acute blood products. Curvilinear and irregular enhancement surrounding the cystic/necrotic component of the mass, measuring up to 1.3 cm in thickness. Moderate surrounding T2 FLAIR hyperintense parenchymal signal abnormality, which may reflect edema and/or non-enhancing infiltrative tumor. Mass effect with near complete effacement of the right lateral ventricle and 8 mm leftward midline shift. Additionally, there is a degree of right uncal herniation with partial effacement of the basal cisterns on the right and mass effect upon the right aspect of the midbrain. 1-2 mm nodular enhancing lesion within the right internal auditory canal (series 13, image 24). Multifocal T2 FLAIR hyperintense signal abnormality elsewhere within the cerebral white matter, nonspecific but compatible with mild chronic small vessel ischemic disease. There is no acute infarct. No extra-axial fluid collection. Vascular: Maintained  flow voids within the proximal large arterial vessels. Skull and upper cervical spine: No focal suspicious marrow lesion. Incompletely assessed cervical spondylosis. Sinuses/Orbits: No mass or acute finding within the imaged orbits. Minimal mucosal thickening within the left frontal and bilateral ethmoid sinuses. IMPRESSION: 1. 6.8 x 5.5 x 5.1 cm predominantly cystic/necrotic mass within the right cerebral hemisphere, centered within the right temporal lobe, as described. This is favored to reflect a primary CNS neoplasm (such as a high-grade glioma), although a metastasis cannot be excluded. Neurosurgery and neuro-oncology consultation recommended. Mass effect with 8 mm leftward midline  shift, a degree of right uncal herniation (with mass effect upon the right aspect of the midbrain) and near complete effacement of the right lateral ventricle. No evidence of ventricular entrapment. 2. 1-2 mm nodular enhancing lesion within the right internal auditory canal, which may reflect a vestibular schwannoma. Again, an intracranial metastasis cannot be excluded. Electronically Signed   By: Jackey Loge D.O.   On: 01/30/2023 17:59   DG Chest Port 1 View  Result Date: 01/30/2023 CLINICAL DATA:  new brain lesion EXAM: PORTABLE CHEST 1 VIEW COMPARISON:  07/05/2021. FINDINGS: Bilateral lung fields are clear. Bilateral costophrenic angles are clear. Normal cardio-mediastinal silhouette. No acute osseous abnormalities. The soft tissues are within normal limits. IMPRESSION: No active disease. Electronically Signed   By: Jules Schick M.D.   On: 01/30/2023 14:21   CT HEAD WO CONTRAST ( )  Addendum Date: 01/30/2023   ADDENDUM REPORT: 01/30/2023 11:38 ADDENDUM: Study discussed by telephone with Dr. Georgann Housekeeper on 01/30/2023 at 1129 hours. Electronically Signed   By: Odessa Fleming M.D.   On: 01/30/2023 11:38   Result Date: 01/30/2023 CLINICAL DATA:  72 year old male with headache, vertigo for 3 weeks. EXAM: CT HEAD WITHOUT CONTRAST TECHNIQUE: Contiguous axial images were obtained from the base of the skull through the vertex without intravenous contrast. RADIATION DOSE REDUCTION: This exam was performed according to the departmental dose-optimization program which includes automated exposure control, adjustment of the mA and/or kV according to patient size and/or use of iterative reconstruction technique. COMPARISON:  None Available. FINDINGS: Brain: Mixed density but predominantly cystic/fluid density mass in the right cerebral hemisphere. The abnormality expands the right temporal lobe. The fairly circumscribed cystic component encompasses 67 by 40 x 45 mm (AP by transverse by CC), but there is additional  regional tumoral edema, and hyperdensity of the parenchyma lateral to the cystic mass is indeterminate for additional tumor versus compressed normal parenchyma. Mass effect on the right lateral ventricle as well as up to 9 mm of leftward midline shift (series 2, image 16). No ventriculomegaly or trapping of the left lateral ventricle. Basilar cisterns remain normal. Left hemisphere and posterior fossa gray-white differentiation is normal. No acute intracranial hemorrhage identified. No cortically based acute infarct identified. Vascular: Calcified atherosclerosis at the skull base. No suspicious intracranial vascular hyperdensity. Skull: Intact, negative. Sinuses/Orbits: Visualized paranasal sinuses and mastoids are well aerated. Other: Visualized orbits and scalp soft tissues are within normal limits. IMPRESSION: Large nearly 7 cm predominantly cystic mass in the right cerebral hemisphere, epicenter at the temporal lobe. Regional tumoral edema, and associated intracranial mass effect with and up to 9 mm of leftward midline shift. Constellation favors of partially cystic primary brain tumor over solitary metastasis or other differential considerations. Recommend Brain MRI without and with contrast to further characterize. Electronically Signed: By: Odessa Fleming M.D. On: 01/30/2023 11:14    Antibiotics:  Anti-infectives (From admission, onward)    None  Discharge Exam: Blood pressure 123/77, pulse 93, temperature 98 F (36.7 C), resp. rate 18, height 5\' 6"  (1.676 m), weight 79.8 kg, SpO2 91%. Neurologic: Grossly normal Ambulating and voiding well incision cdi   Discharge Medications:   Allergies as of 02/02/2023       Reactions   Aspirin Other (See Comments)        Medication List     TAKE these medications    ALLEGRA PO Take by mouth daily.   amLODipine-benazepril 5-10 MG capsule Commonly known as: LOTREL Take 1 capsule by mouth daily.   atorvastatin 10 MG tablet Commonly known  as: LIPITOR Take 10 mg by mouth daily.   famotidine 40 MG tablet Commonly known as: PEPCID Take 40 mg by mouth daily.   fluticasone 50 MCG/ACT nasal spray Commonly known as: FLONASE Place 1 spray into both nostrils daily.   HYDROcodone-acetaminophen 5-325 MG tablet Commonly known as: NORCO/VICODIN Take 1 tablet by mouth every 4 (four) hours as needed for moderate pain.   ibuprofen 200 MG tablet Commonly known as: ADVIL Take 200 mg by mouth daily as needed for mild pain.   levETIRAcetam 500 MG tablet Commonly known as: Keppra Take 1 tablet (500 mg total) by mouth 2 (two) times daily.   predniSONE 10 MG tablet Commonly known as: DELTASONE Take 1 tablet (10 mg total) by mouth daily with breakfast.   Vitamin D (Ergocalciferol) 1.25 MG (50000 UNIT) Caps capsule Commonly known as: DRISDOL Take 50,000 Units by mouth every 14 (fourteen) days.        Disposition: home   Final Dx: crani for resection of temporal tumor  Discharge Instructions     Call MD for:  difficulty breathing, headache or visual disturbances   Complete by: As directed    Call MD for:  hives   Complete by: As directed    Call MD for:  persistant dizziness or light-headedness   Complete by: As directed    Call MD for:  persistant nausea and vomiting   Complete by: As directed    Call MD for:  redness, tenderness, or signs of infection (pain, swelling, redness, odor or green/yellow discharge around incision site)   Complete by: As directed    Call MD for:  severe uncontrolled pain   Complete by: As directed    Call MD for:  temperature >100.4   Complete by: As directed    Diet - low sodium heart healthy   Complete by: As directed    Driving Restrictions   Complete by: As directed    No driving for 2 weeks, no riding in the car for 1 week   Increase activity slowly   Complete by: As directed    Lifting restrictions   Complete by: As directed    No lifting more than 8 lbs   No wound care    Complete by: As directed           Signed: Tiana Loft Robi Dewolfe 02/02/2023, 7:28 AM

## 2023-02-13 DIAGNOSIS — Z7952 Long term (current) use of systemic steroids: Secondary | ICD-10-CM | POA: Diagnosis not present

## 2023-02-13 DIAGNOSIS — Z9889 Other specified postprocedural states: Secondary | ICD-10-CM | POA: Diagnosis not present

## 2023-02-13 DIAGNOSIS — G939 Disorder of brain, unspecified: Secondary | ICD-10-CM | POA: Diagnosis not present

## 2023-02-14 LAB — SURGICAL PATHOLOGY

## 2023-02-15 ENCOUNTER — Encounter (HOSPITAL_COMMUNITY): Payer: Self-pay

## 2023-02-19 ENCOUNTER — Telehealth: Payer: Self-pay | Admitting: Radiation Oncology

## 2023-02-19 ENCOUNTER — Telehealth: Payer: Self-pay | Admitting: Radiation Therapy

## 2023-02-19 NOTE — Telephone Encounter (Signed)
Called patient to schedule a consultation w. Dr. Manning. No answer, LVM for a return call.  ?

## 2023-02-19 NOTE — Telephone Encounter (Signed)
Left a detailed message on the patient's cell vm. Called to share that Dr. Lovell Sheehan referred the patient to be seen by neuro oncologist, Dr. Barbaraann Cao and the radiation oncology team. The visit for Dr. Barbaraann Cao has been scheduled on 9/19 and the radiation oncology team will be reaching out for a visit soon afterwards.   Message included my contact information and a request to return my call.Jalene Mullet R.T.(R)(T) Radiation Special Procedures Navigator

## 2023-02-26 DIAGNOSIS — Z03818 Encounter for observation for suspected exposure to other biological agents ruled out: Secondary | ICD-10-CM | POA: Diagnosis not present

## 2023-02-26 DIAGNOSIS — R509 Fever, unspecified: Secondary | ICD-10-CM | POA: Diagnosis not present

## 2023-02-26 DIAGNOSIS — R059 Cough, unspecified: Secondary | ICD-10-CM | POA: Diagnosis not present

## 2023-02-27 NOTE — Progress Notes (Signed)
Location/Histology of Brain Tumor: Right Temporal Brain  02/01/2023 Dr. Tressie Stalker MR Brain with/without Contrast CLINICAL DATA:  Brain/CNS neoplasm, monitor. Postop day 1 following resection of a right temporal brain tumor.  IMPRESSION: 1. Postoperative changes from interval right temporal tumor resection with nonspecific enhancement along the margins of the resection cavity. This will serve as a baseline for follow-up examinations. 2. Decreased mass effect with 3 mm of residual leftward midline shift. 3. 2 mm enhancing focus in the right internal auditory canal, potentially a vestibular schwannoma with metastatic disease not excluded pending final pathology of the resected tumor.   01/30/2023 Val Eagle, NP MR Brain with/without Contrast CLINICAL DATA: Provided history: Brain/CNS neoplasm, staging   IMPRESSION: 1. 6.8 x 5.5 x 5.1 cm predominantly cystic/necrotic mass within the right cerebral hemisphere, centered within the right temporal lobe, as described. This is favored to reflect a primary CNS neoplasm (such as a high-grade glioma), although a metastasis cannot be excluded. Neurosurgery and neuro-oncology consultation recommended.  Mass effect with 8 mm leftward midline shift, a degree of right uncal herniation (with mass effect upon the right aspect of the midbrain) and near complete effacement of the right lateral ventricle. No evidence of ventricular entrapment. 2. 1-2 mm nodular enhancing lesion within the right internal auditory canal, which may reflect a vestibular schwannoma. Again, an intracranial metastasis cannot be excluded.   01/30/2023 Dr. Georgann Housekeeper CT Head without Contrast CLINICAL DATA: 72 year old male with headache, vertigo for 3 weeks.   IMPRESSION: Large nearly 7 cm predominantly cystic mass in the right cerebral hemisphere, epicenter at the temporal lobe. Regional tumoral edema, and associated intracranial mass effect with and up to 9 mm  of leftward midline shift. Constellation favors of partially cystic primary brain tumor over solitary metastasis or other differential considerations. Recommend Brain MRI without and with contrast to further characterize.  Past or anticipated interventions, if any, per neurosurgery:   01/31/2023 Dr. Lovell Sheehan Right temporal craniotomy for tumor resection.  Past or anticipated interventions, if any, per medical oncology: NA  Dose of Decadron, if applicable: No   Recent neurologic symptoms, if any:  Seizures: No Headaches: occasional Nausea: No Dizziness/ataxia: mild Difficulty with hand coordination: No  Focal numbness/weakness: No, does have carpal tunnel. Visual deficits/changes: No Confusion/Memory deficits: No  Painful bone metastases at present, if any:  No  SAFETY ISSUES: Prior radiation?  No Pacemaker/ICD? No Possible current pregnancy? Male Is the patient on methotrexate? No  Additional Complaints / other details:

## 2023-02-28 ENCOUNTER — Other Ambulatory Visit: Payer: Self-pay

## 2023-02-28 ENCOUNTER — Encounter: Payer: Self-pay | Admitting: Internal Medicine

## 2023-02-28 ENCOUNTER — Telehealth: Payer: Self-pay | Admitting: Pharmacy Technician

## 2023-02-28 ENCOUNTER — Telehealth: Payer: Self-pay | Admitting: Pharmacist

## 2023-02-28 ENCOUNTER — Other Ambulatory Visit (HOSPITAL_COMMUNITY): Payer: Self-pay

## 2023-02-28 ENCOUNTER — Inpatient Hospital Stay: Payer: Medicare Other | Attending: Internal Medicine | Admitting: Internal Medicine

## 2023-02-28 ENCOUNTER — Inpatient Hospital Stay: Payer: Medicare Other

## 2023-02-28 ENCOUNTER — Telehealth: Payer: Self-pay | Admitting: *Deleted

## 2023-02-28 VITALS — BP 137/65 | HR 69 | Temp 97.9°F | Resp 20 | Wt 173.8 lb

## 2023-02-28 DIAGNOSIS — C712 Malignant neoplasm of temporal lobe: Secondary | ICD-10-CM | POA: Diagnosis not present

## 2023-02-28 DIAGNOSIS — Z87891 Personal history of nicotine dependence: Secondary | ICD-10-CM | POA: Insufficient documentation

## 2023-02-28 DIAGNOSIS — C719 Malignant neoplasm of brain, unspecified: Secondary | ICD-10-CM

## 2023-02-28 MED ORDER — ONDANSETRON HCL 8 MG PO TABS
8.0000 mg | ORAL_TABLET | Freq: Three times a day (TID) | ORAL | 1 refills | Status: DC | PRN
  Filled 2023-02-28: qty 30, 10d supply, fill #0
  Filled 2023-05-27: qty 30, 10d supply, fill #1

## 2023-02-28 MED ORDER — TEMOZOLOMIDE 140 MG PO CAPS
140.0000 mg | ORAL_CAPSULE | Freq: Every day | ORAL | 0 refills | Status: DC
  Filled 2023-02-28 – 2023-03-05 (×3): qty 42, 42d supply, fill #0

## 2023-02-28 NOTE — Telephone Encounter (Signed)
Oral Oncology Patient Advocate Encounter  After completing a benefits investigation, prior authorization for temozolomide is not required at this time through [facility].  Patient's copay is $150.21.     Jinger Neighbors, CPhT-Adv Oncology Pharmacy Patient Advocate [facility] Direct Number: [phone]  Fax: [phone]

## 2023-02-28 NOTE — Progress Notes (Signed)

## 2023-02-28 NOTE — Telephone Encounter (Signed)
Molecular Profiling Requisition for this patient signed by Dr Barbaraann Cao & faxed to [facility], also patient demographic sheet, copy of insurance card, pathology report, today's progress note & MRI report.  Fax confirmation received.

## 2023-02-28 NOTE — Telephone Encounter (Signed)
Clinical Pharmacist Practitioner Encounter   Received new prescription for Temodar (temozolomide) for the treatment of GBM in conjunction with radiation, planned duration until the end of radiation and potential as maintenance post radiation.  Patient has a radonc consult appt on 9/23.   Prescription dose and frequency assessed.   Current medication list in Epic reviewed, no DDIs with temozolomide identified.   Evaluated chart and no patient barriers to medication adherence identified.   Prescription has been e-scribed to the [facility] for benefits analysis and approval.  Oral Oncology Clinic will continue to follow for insurance authorization, copayment issues, initial counseling and start date.   Remi Haggard, PharmD, BCPS, BCOP, CPP Hematology/Oncology Clinical Pharmacist Practitioner [place] [phone]  02/28/2023 3:48 PM

## 2023-02-28 NOTE — Progress Notes (Signed)
[facility] at [facility] [address]  [place], [place] [address] [phone]   New Patient Evaluation  Date of Service: 02/28/23 Patient Name: Juan Garrett Patient MRN: [id number] Patient DOB: [birth year] Provider: Henreitta Leber, MD  Identifying Statement:  Juan Garrett is a 72 y.o. male with right temporal glioblastoma who presents for initial consultation and evaluation.    Referring Provider: Georgann Housekeeper, MD [address] [place],  [place] [address]  Oncologic History: Oncology History  Glioblastoma, IDH-wildtype [facility])  01/31/2023 Surgery   Craniotomy, resection of right temporal mass with Dr. Lovell Sheehan; pathology demonstrates Glioblastoma, IDH-wt     Biomarkers:  MGMT Unknown.  IDH 1/2 Wild type.  EGFR Unknown  TERT Unknown   History of Present Illness: The patient's records from the referring physician were obtained and reviewed and the patient interviewed to confirm this HPI.  [name] presented to neurologic attention in August 2024 with several weeks of progressive confusion, disorientation, and several falls.  CNS imaging demonstrated large cystic mass in right temporal lobe.  He underwent craniotomy, resection with Dr. Lovell Sheehan on 01/31/23 without complication, pathology demonstrated glioblastoma.  Since surgery, he has been at home, doing well.  No issues with gait, no recurrence of confusion, visual spatial issues.  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  Medications: Current Outpatient Medications on File Prior to Visit  Medication Sig Dispense Refill   amLODipine-benazepril (LOTREL) 5-10 MG capsule Take 1 capsule by mouth daily.     atorvastatin (LIPITOR) 10 MG tablet Take 10 mg by mouth daily.     famotidine (PEPCID) 40 MG tablet Take 40 mg by mouth daily.     Fexofenadine HCl (ALLEGRA PO) Take by mouth daily.     fluticasone (FLONASE) 50 MCG/ACT nasal spray Place 1 spray into both  nostrils daily.     HYDROcodone-acetaminophen (NORCO/VICODIN) 5-325 MG tablet Take 1 tablet by mouth every 4 (four) hours as needed for moderate pain. 30 tablet 0   ibuprofen (ADVIL) 200 MG tablet Take 200 mg by mouth daily as needed for mild pain.     levETIRAcetam (KEPPRA) 500 MG tablet Take 1 tablet (500 mg total) by mouth 2 (two) times daily. 30 tablet 0   ondansetron (ZOFRAN) 4 MG tablet Take 1 tablet (4 mg total) by mouth every 8 (eight) hours as needed for nausea. 30 tablet 1   predniSONE (DELTASONE) 10 MG tablet Take 1 tablet (10 mg total) by mouth daily with breakfast. 30 tablet 0   Vitamin D, Ergocalciferol, (DRISDOL) 1.25 MG (50000 UNIT) CAPS capsule Take 50,000 Units by mouth every 14 (fourteen) days.     No current facility-administered medications on file prior to visit.    Allergies:  Allergies  Allergen Reactions   Aspirin Other (See Comments)   Past Medical History:  Past Medical History:  Diagnosis Date   Allergy    seasonal allergies   Arthritis    bilateral knees   Cataract    not a surgical candidate at this time (07/06/2020)   GERD (gastroesophageal reflux disease)    on meds   Hypertension    on meds   Past Surgical History:  Past Surgical History:  Procedure Laterality Date   APPLICATION OF CRANIAL NAVIGATION N/A 01/31/2023   Procedure: APPLICATION OF CRANIAL NAVIGATION;  Surgeon: Tressie Stalker, MD;  Location: [facility] OR;  Service: Neurosurgery;  Laterality: N/A;   CARPAL TUNNEL RELEASE Bilateral 1997   CHONDROPLASTY Right  09/23/2019   Procedure: CHONDROPLASTY;  Surgeon: Tarry Kos, MD;  Location: [facility];  Service: Orthopedics;  Laterality: Right;   CRANIOTOMY Right 01/31/2023   Procedure: CRANIOTOMY TUMOR EXCISION;  Surgeon: Tressie Stalker, MD;  Location: [facility] OR;  Service: Neurosurgery;  Laterality: Right;   KNEE ARTHROSCOPY WITH MEDIAL MENISECTOMY Right 09/23/2019   Procedure: RIGHT KNEE ARTHROSCOPY WITH PARTIAL MEDIAL MENISCECTOMY;   Surgeon: Tarry Kos, MD;  Location: [facility];  Service: Orthopedics;  Laterality: Right;   KNEE SURGERY Left 1992   Social History:  Social History   Socioeconomic History   Marital status: Married    Spouse name: Not on file   Number of children: Not on file   Years of education: Not on file   Highest education level: Not on file  Occupational History   Not on file  Tobacco Use   Smoking status: Former    Types: Cigarettes   Smokeless tobacco: Never  Vaping Use   Vaping status: Never Used  Substance and Sexual Activity   Alcohol use: Not Currently   Drug use: Never   Sexual activity: Not on file  Other Topics Concern   Not on file  Social History Narrative   Not on file   Social Determinants of Health   Financial Resource Strain: Not on file  Food Insecurity: No Food Insecurity (02/01/2023)   Hunger Vital Sign    Worried About Running Out of Food in the Last Year: Never true    Ran Out of Food in the Last Year: Never true  Transportation Needs: No Transportation Needs (02/01/2023)   PRAPARE - Administrator, Civil Service (Medical): No    Lack of Transportation (Non-Medical): No  Physical Activity: Not on file  Stress: Not on file  Social Connections: Not on file  Intimate Partner Violence: Not At Risk (02/01/2023)   Humiliation, Afraid, Rape, and Kick questionnaire    Fear of Current or Ex-Partner: No    Emotionally Abused: No    Physically Abused: No    Sexually Abused: No   Family History:  Family History  Problem Relation Age of Onset   Colon polyps Neg Hx    Colon cancer Neg Hx    Esophageal cancer Neg Hx    Stomach cancer Neg Hx    Rectal cancer Neg Hx     Review of Systems: Constitutional: Doesn't report fevers, chills or abnormal weight loss Eyes: Doesn't report blurriness of vision Ears, nose, mouth, throat, and face: Doesn't report sore throat Respiratory: Doesn't report cough, dyspnea or wheezes Cardiovascular:  Doesn't report palpitation, chest discomfort  Gastrointestinal:  Doesn't report nausea, constipation, diarrhea GU: Doesn't report incontinence Skin: Doesn't report skin rashes Neurological: Per HPI Musculoskeletal: Doesn't report joint pain Behavioral/Psych: Doesn't report anxiety  Physical Exam: Vitals:   02/28/23 1009  BP: 137/65  Pulse: 69  Resp: 20  Temp: 97.9 F (36.6 C)  SpO2: 98%   KPS: 90. General: Alert, cooperative, pleasant, in no acute distress Head: Normal EENT: No conjunctival injection or scleral icterus.  Lungs: Resp effort normal Cardiac: Regular rate Abdomen: Non-distended abdomen Skin: No rashes cyanosis or petechiae. Extremities: No clubbing or edema  Neurologic Exam: Mental Status: Awake, alert, attentive to examiner. Oriented to self and environment. Language is fluent with intact comprehension.  Cranial Nerves: Visual acuity is grossly normal. Visual fields are full. Extra-ocular movements intact. No ptosis. Face is symmetric Motor: Tone and bulk are normal. Power is full  in both arms and legs. Reflexes are symmetric, no pathologic reflexes present.  Sensory: Intact to light touch Gait: Normal.   Labs: I have reviewed the data as listed    Component Value Date/Time   NA 141 01/30/2023 1233   K 3.9 01/30/2023 1233   CL 103 01/30/2023 1233   CO2 23 01/30/2023 1233   GLUCOSE 111 (H) 01/30/2023 1233   BUN 22 01/30/2023 1233   CREATININE 1.12 01/30/2023 1233   CALCIUM 9.6 01/30/2023 1233   PROT 6.8 01/30/2023 1233   ALBUMIN 3.8 01/30/2023 1233   AST 19 01/30/2023 1233   ALT 22 01/30/2023 1233   ALKPHOS 50 01/30/2023 1233   BILITOT 0.6 01/30/2023 1233   GFRNONAA >60 01/30/2023 1233   Lab Results  Component Value Date   WBC 8.7 01/30/2023   HGB 15.0 01/30/2023   HCT 44.2 01/30/2023   MCV 84.0 01/30/2023   PLT 256 01/30/2023    Imaging MR BRAIN W WO CONTRAST  Result Date: 02/01/2023 CLINICAL DATA:  Brain/CNS neoplasm, monitor. Postop  day 1 following resection of a right temporal brain tumor. EXAM: MRI HEAD WITHOUT AND WITH CONTRAST TECHNIQUE: Multiplanar, multiecho pulse sequences of the brain and surrounding structures were obtained without and with intravenous contrast. CONTRAST:  8mL GADAVIST GADOBUTROL 1 MMOL/ML IV SOLN COMPARISON:  Head MRI 01/30/2023 FINDINGS: Brain: Sequelae of interval right temporal craniotomy are identified for tumor resection. There is a resection cavity in the right temporal lobe with associated blood products, and there is mild bilateral pneumocephalus. Incomplete enhancement is noted along the margins of the resection cavity measuring up to 5 mm in thickness. A small amount of restricted diffusion is noted along the margins of the resection cavity, predominantly superiorly, and may reflect a small amount of perioperative ischemia. There is moderate residual nonenhancing T2 hyperintensity/edema in the right temporal white matter extending into the deep white matter tracts at the level of the basal ganglia. Mass effect has decreased, with 3 mm of residual leftward midline shift (previously 8 mm). There is decreased effacement of the right lateral ventricle and decreased right uncal herniation. There is no evidence of hydrocephalus. Mild diffuse dural enhancement over both cerebral convexities is considered postoperative, and there are small subdural fluid collections over the cerebral convexities measuring up to 6 mm in thickness on the right and 3 mm on the left without significant mass effect. Scattered small T2 hyperintensities elsewhere in the cerebral white matter bilaterally are unchanged and nonspecific but compatible with mild chronic small vessel ischemic disease. There are tiny chronic bilateral cerebellar infarcts. A 2 mm enhancing focus in the right internal auditory canal was more conspicuous on the prior MRI (series 10, image 15). Vascular: Major intracranial vascular flow voids are preserved. Skull and  upper cervical spine: Right temporal craniotomy. No suspicious marrow lesion. Sinuses/Orbits: No acute finding. Other: Fluid and gas in the scalp overlying the craniotomy. IMPRESSION: 1. Postoperative changes from interval right temporal tumor resection with nonspecific enhancement along the margins of the resection cavity. This will serve as a baseline for follow-up examinations. 2. Decreased mass effect with 3 mm of residual leftward midline shift. 3. 2 mm enhancing focus in the right internal auditory canal, potentially a vestibular schwannoma with metastatic disease not excluded pending final pathology of the resected tumor. Electronically Signed   By: Sebastian Ache M.D.   On: 02/01/2023 16:02   MR BRAIN W WO CONTRAST  Result Date: 01/30/2023 CLINICAL DATA:  Provided history: Brain/CNS neoplasm, staging  EXAM: MRI HEAD WITHOUT AND WITH CONTRAST TECHNIQUE: Multiplanar, multiecho pulse sequences of the brain and surrounding structures were obtained without and with intravenous contrast. CONTRAST:  10 mL Gadavist intravenous contrast COMPARISON:  Head CT 01/30/2023. FINDINGS: Brain: No age advanced or lobar predominant parenchymal atrophy. 6.8 x 5.5 x 5.1 cm predominantly cystic/necrotic mass in the right cerebral hemisphere, centered within the right temporal lobe. Pre-contrast T1 hyperintensity and diffusion-weighted signal hyperintensity within the cystic/necrotic component of the mass posteriorly, likely reflecting non-acute blood products. Curvilinear and irregular enhancement surrounding the cystic/necrotic component of the mass, measuring up to 1.3 cm in thickness. Moderate surrounding T2 FLAIR hyperintense parenchymal signal abnormality, which may reflect edema and/or non-enhancing infiltrative tumor. Mass effect with near complete effacement of the right lateral ventricle and 8 mm leftward midline shift. Additionally, there is a degree of right uncal herniation with partial effacement of the basal  cisterns on the right and mass effect upon the right aspect of the midbrain. 1-2 mm nodular enhancing lesion within the right internal auditory canal (series 13, image 24). Multifocal T2 FLAIR hyperintense signal abnormality elsewhere within the cerebral white matter, nonspecific but compatible with mild chronic small vessel ischemic disease. There is no acute infarct. No extra-axial fluid collection. Vascular: Maintained flow voids within the proximal large arterial vessels. Skull and upper cervical spine: No focal suspicious marrow lesion. Incompletely assessed cervical spondylosis. Sinuses/Orbits: No mass or acute finding within the imaged orbits. Minimal mucosal thickening within the left frontal and bilateral ethmoid sinuses. IMPRESSION: 1. 6.8 x 5.5 x 5.1 cm predominantly cystic/necrotic mass within the right cerebral hemisphere, centered within the right temporal lobe, as described. This is favored to reflect a primary CNS neoplasm (such as a high-grade glioma), although a metastasis cannot be excluded. Neurosurgery and neuro-oncology consultation recommended. Mass effect with 8 mm leftward midline shift, a degree of right uncal herniation (with mass effect upon the right aspect of the midbrain) and near complete effacement of the right lateral ventricle. No evidence of ventricular entrapment. 2. 1-2 mm nodular enhancing lesion within the right internal auditory canal, which may reflect a vestibular schwannoma. Again, an intracranial metastasis cannot be excluded. Electronically Signed   By: Jackey Loge D.O.   On: 01/30/2023 17:59   DG Chest Port 1 View  Result Date: 01/30/2023 CLINICAL DATA:  new brain lesion EXAM: PORTABLE CHEST 1 VIEW COMPARISON:  07/05/2021. FINDINGS: Bilateral lung fields are clear. Bilateral costophrenic angles are clear. Normal cardio-mediastinal silhouette. No acute osseous abnormalities. The soft tissues are within normal limits. IMPRESSION: No active disease. Electronically  Signed   By: Jules Schick M.D.   On: 01/30/2023 14:21   CT HEAD WO CONTRAST ( )  Addendum Date: 01/30/2023   ADDENDUM REPORT: 01/30/2023 11:38 ADDENDUM: Study discussed by telephone with Dr. Georgann Housekeeper on 01/30/2023 at 1129 hours. Electronically Signed   By: Odessa Fleming M.D.   On: 01/30/2023 11:38   Result Date: 01/30/2023 CLINICAL DATA:  72 year old male with headache, vertigo for 3 weeks. EXAM: CT HEAD WITHOUT CONTRAST TECHNIQUE: Contiguous axial images were obtained from the base of the skull through the vertex without intravenous contrast. RADIATION DOSE REDUCTION: This exam was performed according to the departmental dose-optimization program which includes automated exposure control, adjustment of the mA and/or kV according to patient size and/or use of iterative reconstruction technique. COMPARISON:  None Available. FINDINGS: Brain: Mixed density but predominantly cystic/fluid density mass in the right cerebral hemisphere. The abnormality expands the right temporal lobe. The fairly circumscribed  cystic component encompasses 67 by 40 x 45 mm (AP by transverse by CC), but there is additional regional tumoral edema, and hyperdensity of the parenchyma lateral to the cystic mass is indeterminate for additional tumor versus compressed normal parenchyma. Mass effect on the right lateral ventricle as well as up to 9 mm of leftward midline shift (series 2, image 16). No ventriculomegaly or trapping of the left lateral ventricle. Basilar cisterns remain normal. Left hemisphere and posterior fossa gray-white differentiation is normal. No acute intracranial hemorrhage identified. No cortically based acute infarct identified. Vascular: Calcified atherosclerosis at the skull base. No suspicious intracranial vascular hyperdensity. Skull: Intact, negative. Sinuses/Orbits: Visualized paranasal sinuses and mastoids are well aerated. Other: Visualized orbits and scalp soft tissues are within normal limits. IMPRESSION:  Large nearly 7 cm predominantly cystic mass in the right cerebral hemisphere, epicenter at the temporal lobe. Regional tumoral edema, and associated intracranial mass effect with and up to 9 mm of leftward midline shift. Constellation favors of partially cystic primary brain tumor over solitary metastasis or other differential considerations. Recommend Brain MRI without and with contrast to further characterize. Electronically Signed: By: Odessa Fleming M.D. On: 01/30/2023 11:14    Pathology: SURGICAL PATHOLOGY  CASE: [phone]  PATIENT: Dunbar Wallington  Surgical Pathology Report   Clinical History: brain tumor (cm)   FINAL MICROSCOPIC DIAGNOSIS:   A. BRAIN TUMOR, RIGHT TEMPORAL, EXCISION:  - Glioblastoma, IDH-wild-type (WHO grade 4), see comment   COMMENT:   This case was sent in consultation to Dr. Foye Deer at [facility], [place], [place].  A complete copy of the outside  pathology report is available in patient's electronic medical records.   GROSS DESCRIPTION:   Received fresh are portions of soft tan-white to red tissue measuring  2.5 x 2.2 x 0.8 cm in aggregate.  The specimen is sectioned and entirely  submitted in 3 cassettes.  [facility] 02/01/2023)    Final Diagnosis performed by Holley Bouche, MD.   Electronically  signed 02/14/2023    Assessment/Plan Glioblastoma, IDH-wildtype [facility])  We appreciate the opportunity to participate in the care of Ember Pullum Scholes.  He is doing well since his craniotomy, with noted resolution of focal deficits.  We had an extensive conversation with him regarding pathology, prognosis, and available treatment pathways.  We are encouraged by good quality resection given initial size of the tumor, as well as good functional status and minimal comorbidity.  He has weaned off corticosteroids as well.  We ultimately recommended proceeding with course of intensity modulated radiation therapy and concurrent daily Temozolomide.   Radiation will be administered Mon-Fri over 6 weeks, Temodar will be dosed at 75mg /m2 to be given daily over 42 days.  We reviewed side effects of temodar, including fatigue, nausea/vomiting, constipation, and cytopenias.  Informed consent was verbally obtained at bedside to proceed with oral chemotherapy.  Chemotherapy should be held for the following:  ANC less than 1,000  Platelets less than 100,000  LFT or creatinine greater than 2x ULN  If clinical concerns/contraindications develop  He is scheduled to meet with radiation oncology on 03/04/23.   Every 2 weeks during radiation, labs will be checked accompanied by a clinical evaluation in the brain tumor clinic.  Screening for potential clinical trials was performed and discussed using eligibility criteria for active protocols at [facility], loco-regional tertiary centers, as well as national database available on GroundTransfer.at.    The patient is not a candidate for a research protocol at this time due  to patient preference.   We spent twenty additional minutes teaching regarding the natural history, biology, and historical experience in the treatment of brain tumors. We then discussed in detail the current recommendations for therapy focusing on the mode of administration, mechanism of action, anticipated toxicities, and quality of life issues associated with this plan. We also provided teaching sheets for the patient to take home as an additional resource.  All questions were answered. The patient knows to call the clinic with any problems, questions or concerns. No barriers to learning were detected.  The total time spent in the encounter was 60 minutes and more than 50% was on counseling and review of test results   Henreitta Leber, MD Medical Director of Neuro-Oncology [facility] at [place] 02/28/23 10:03 AM

## 2023-03-01 ENCOUNTER — Other Ambulatory Visit (HOSPITAL_COMMUNITY): Payer: Self-pay

## 2023-03-02 ENCOUNTER — Other Ambulatory Visit: Payer: Self-pay

## 2023-03-04 ENCOUNTER — Ambulatory Visit
Admission: RE | Admit: 2023-03-04 | Discharge: 2023-03-04 | Disposition: A | Discharge status: Home / Self Care | Payer: Medicare Other | Source: Ambulatory Visit | Attending: Radiation Oncology

## 2023-03-04 ENCOUNTER — Ambulatory Visit
Admission: RE | Admit: 2023-03-04 | Discharge: 2023-03-04 | Disposition: A | Discharge status: Home / Self Care | Payer: Medicare Other | Source: Ambulatory Visit | Attending: Radiation Oncology | Admitting: Radiation Oncology

## 2023-03-04 ENCOUNTER — Encounter: Payer: Self-pay | Admitting: Radiation Oncology

## 2023-03-04 VITALS — BP 129/69 | HR 64 | Temp 97.7°F | Resp 18 | Ht 65.0 in | Wt 172.4 lb

## 2023-03-04 DIAGNOSIS — D496 Neoplasm of unspecified behavior of brain: Secondary | ICD-10-CM

## 2023-03-04 DIAGNOSIS — Z79630 Long term (current) use of alkylating agent: Secondary | ICD-10-CM | POA: Insufficient documentation

## 2023-03-04 DIAGNOSIS — K219 Gastro-esophageal reflux disease without esophagitis: Secondary | ICD-10-CM | POA: Insufficient documentation

## 2023-03-04 DIAGNOSIS — Z7952 Long term (current) use of systemic steroids: Secondary | ICD-10-CM | POA: Insufficient documentation

## 2023-03-04 DIAGNOSIS — Z79899 Other long term (current) drug therapy: Secondary | ICD-10-CM | POA: Insufficient documentation

## 2023-03-04 DIAGNOSIS — Z87891 Personal history of nicotine dependence: Secondary | ICD-10-CM | POA: Diagnosis not present

## 2023-03-04 DIAGNOSIS — I10 Essential (primary) hypertension: Secondary | ICD-10-CM | POA: Insufficient documentation

## 2023-03-04 DIAGNOSIS — C712 Malignant neoplasm of temporal lobe: Secondary | ICD-10-CM | POA: Insufficient documentation

## 2023-03-04 DIAGNOSIS — M129 Arthropathy, unspecified: Secondary | ICD-10-CM | POA: Diagnosis not present

## 2023-03-04 DIAGNOSIS — C719 Malignant neoplasm of brain, unspecified: Secondary | ICD-10-CM | POA: Diagnosis not present

## 2023-03-04 NOTE — Progress Notes (Signed)
Radiation Oncology         [phone]) [phone] ________________________________  Initial outpatient Consultation  Name: Juan Garrett MRN: [id number]  Date of Service: 03/04/2023 DOB: Dec 18, 1950  HQ:IONGEX, Jerelyn Scott, MD  Tressie Stalker, MD   REFERRING PHYSICIAN: Tressie Stalker, MD  DIAGNOSIS: 72 yo gentleman with right temporal 7 cm glioblastoma, WHO grade 4; s/p gross total resection 02/01/23  The encounter diagnosis was Brain neoplasm [facility]).    ICD-10-CM   1. Brain neoplasm [facility])  D49.6       HISTORY OF PRESENT ILLNESS: Juan Garrett is a 72 y.o. male seen at the request of Dr. Lovell Sheehan. He presented to his PCP, Dr. Donette Larry, with worsening headaches with vertigo and falls. He underwent head CT on 01/30/23 showing: large nearly 7 cm predominantly cystic mass in right cerebral hemisphere, epicenter at temporal lobe; regional tumoral edema and associated intracranial mass effect with and up to 9 mm of leftward midline shift. Because of these findings, he was emergently referred to the ED. In the ED, he underwent brain MRI the same day showing: 6.8 cm predominantly cystic/necrotic mass within right cerebral hemisphere, centered within right temporal lobe, favored to reflect a primary CNS neoplasm; mass effect with 8 mm leftward midline shift, a degree of right uncal herniation, and near complete effacement of right lateral ventricle; no evidence of ventricular entrapment; 1-2 mm nodular enhancing lesion within right internal auditory canal.  He was evaluated by Dr. Lovell Sheehan in the hospital and was taken for craniotomy tumor excision the following day. Pathology from the tumor revealed glioblastoma, IDH-wide-type (WHO grade 4). He underwent a postop brain MRI on 02/01/23 to serve as a new baseline showing: postoperative changes with nonspecific enhancement along margins of resection cavity; decreased mass effect with 3 mm of residual leftward midline shift; 2 mm focus in right internal  auditory canal again noted.  He was referred to Dr. Barbaraann Cao on 02/28/23 to discuss systemic treatment. The recommendation was for concurrent chemoradiation with daily Temozolomide.  PREVIOUS RADIATION THERAPY: No  PAST MEDICAL HISTORY:  Past Medical History:  Diagnosis Date   Allergy    seasonal allergies   Arthritis    bilateral knees   Cataract    not a surgical candidate at this time (07/06/2020)   GERD (gastroesophageal reflux disease)    on meds   Hypertension    on meds      PAST SURGICAL HISTORY: Past Surgical History:  Procedure Laterality Date   APPLICATION OF CRANIAL NAVIGATION N/A 01/31/2023   Procedure: APPLICATION OF CRANIAL NAVIGATION;  Surgeon: Tressie Stalker, MD;  Location: [facility] OR;  Service: Neurosurgery;  Laterality: N/A;   CARPAL TUNNEL RELEASE Bilateral 1997   CHONDROPLASTY Right 09/23/2019   Procedure: CHONDROPLASTY;  Surgeon: Tarry Kos, MD;  Location: [facility];  Service: Orthopedics;  Laterality: Right;   CRANIOTOMY Right 01/31/2023   Procedure: CRANIOTOMY TUMOR EXCISION;  Surgeon: Tressie Stalker, MD;  Location: [facility] OR;  Service: Neurosurgery;  Laterality: Right;   KNEE ARTHROSCOPY WITH MEDIAL MENISECTOMY Right 09/23/2019   Procedure: RIGHT KNEE ARTHROSCOPY WITH PARTIAL MEDIAL MENISCECTOMY;  Surgeon: Tarry Kos, MD;  Location: [facility];  Service: Orthopedics;  Laterality: Right;   KNEE SURGERY Left 1992    FAMILY HISTORY:  Family History  Problem Relation Age of Onset   Colon polyps Neg Hx    Colon cancer Neg Hx    Esophageal cancer Neg Hx    Stomach cancer Neg Hx  Rectal cancer Neg Hx     SOCIAL HISTORY:  Social History   Socioeconomic History   Marital status: Married    Spouse name: Not on file   Number of children: Not on file   Years of education: Not on file   Highest education level: Not on file  Occupational History   Not on file  Tobacco Use   Smoking status: Former    Types: Cigarettes    Smokeless tobacco: Never  Vaping Use   Vaping status: Never Used  Substance and Sexual Activity   Alcohol use: Not Currently   Drug use: Never   Sexual activity: Not on file  Other Topics Concern   Not on file  Social History Narrative   Not on file   Social Determinants of Health   Financial Resource Strain: Not on file  Food Insecurity: No Food Insecurity (03/04/2023)   Hunger Vital Sign    Worried About Running Out of Food in the Last Year: Never true    Ran Out of Food in the Last Year: Never true  Transportation Needs: No Transportation Needs (03/04/2023)   PRAPARE - Administrator, Civil Service (Medical): No    Lack of Transportation (Non-Medical): No  Physical Activity: Not on file  Stress: Not on file  Social Connections: Not on file  Intimate Partner Violence: Not At Risk (03/04/2023)   Humiliation, Afraid, Rape, and Kick questionnaire    Fear of Current or Ex-Partner: No    Emotionally Abused: No    Physically Abused: No    Sexually Abused: No    ALLERGIES: Aspirin  MEDICATIONS:  Current Outpatient Medications  Medication Sig Dispense Refill   amLODipine-benazepril (LOTREL) 5-10 MG capsule Take 1 capsule by mouth daily.     atorvastatin (LIPITOR) 10 MG tablet Take 10 mg by mouth daily.     famotidine (PEPCID) 40 MG tablet Take 40 mg by mouth daily.     Fexofenadine HCl (ALLEGRA PO) Take by mouth daily.     fluticasone (FLONASE) 50 MCG/ACT nasal spray Place 1 spray into both nostrils daily.     Fluticasone Furoate (ARNUITY ELLIPTA) 50 MCG/ACT AEPB      HYDROcodone-acetaminophen (NORCO/VICODIN) 5-325 MG tablet Take 1 tablet by mouth every 4 (four) hours as needed for moderate pain. 30 tablet 0   ibuprofen (ADVIL) 200 MG tablet Take 200 mg by mouth daily as needed for mild pain.     levETIRAcetam (KEPPRA) 500 MG tablet Take 1 tablet (500 mg total) by mouth 2 (two) times daily. (Patient not taking: Reported on 02/28/2023) 30 tablet 0   ondansetron  (ZOFRAN) 4 MG tablet Take 1 tablet (4 mg total) by mouth every 8 (eight) hours as needed for nausea. 30 tablet 1   [START ON 03/07/2023] ondansetron (ZOFRAN) 8 MG tablet Take 1 tablet (8 mg total) by mouth every 8 (eight) hours as needed for nausea or vomiting. May take 30-60 minutes prior to Temodar administration if nausea/vomiting occurs as needed. 30 tablet 1   predniSONE (DELTASONE) 10 MG tablet Take 1 tablet (10 mg total) by mouth daily with breakfast. 30 tablet 0   [START ON 03/07/2023] temozolomide (TEMODAR) 140 MG capsule Take 1 capsule (140 mg total) by mouth daily. May take on an empty stomach to decrease nausea & vomiting. 42 capsule 0   Vitamin D, Ergocalciferol, (DRISDOL) 1.25 MG (50000 UNIT) CAPS capsule Take 50,000 Units by mouth every 14 (fourteen) days.     No current facility-administered  medications for this encounter.    REVIEW OF SYSTEMS:  On review of systems, the patient reports that he is doing well overall. He experiences some headaches that are controlled with Tylenol/Ibuprofen. He endorses residual fatigue since the surgery that continues to improve. He denies any visual/auditory disturbances, issues with memory, extremity numbness/tingling/weakness.     PHYSICAL EXAM:  Wt Readings from Last 3 Encounters:  03/04/23 172 lb 6.4 oz (78.2 kg)  02/28/23 173 lb 12.8 oz (78.8 kg)  01/31/23 176 lb (79.8 kg)   Temp Readings from Last 3 Encounters:  03/04/23 97.7 F (36.5 C) (Oral)  02/28/23 97.9 F (36.6 C)  02/02/23 98 F (36.7 C) (Axillary)   BP Readings from Last 3 Encounters:  03/04/23 129/69  02/28/23 137/65  02/02/23 122/70   Pulse Readings from Last 3 Encounters:  03/04/23 64  02/28/23 69  02/02/23 90   Pain Assessment Pain Score: 0-No pain/10  In general this is a well appearing man in no acute distress. He's alert and oriented x4 and appropriate throughout the examination. Cardiopulmonary assessment is negative for acute distress and he exhibits normal  effort.   Well healed craniotomy scar in the right temporal region.    KPS = 100  100 - Normal; no complaints; no evidence of disease. 90   - Able to carry on normal activity; minor signs or symptoms of disease. 80   - Normal activity with effort; some signs or symptoms of disease. 47   - Cares for self; unable to carry on normal activity or to do active work. 60   - Requires occasional assistance, but is able to care for most of his personal needs. 50   - Requires considerable assistance and frequent medical care. 40   - Disabled; requires special care and assistance. 30   - Severely disabled; hospital admission is indicated although death not imminent. 20   - Very sick; hospital admission necessary; active supportive treatment necessary. 10   - Moribund; fatal processes progressing rapidly. 0     - Dead  Karnofsky DA, Abelmann WH, Craver LS and Burchenal [facility] [phone]) The use of the nitrogen mustards in the palliative treatment of carcinoma: with particular reference to bronchogenic carcinoma Cancer 1 [id number]  LABORATORY DATA:  Lab Results  Component Value Date   WBC 8.7 01/30/2023   HGB 15.0 01/30/2023   HCT 44.2 01/30/2023   MCV 84.0 01/30/2023   PLT 256 01/30/2023   Lab Results  Component Value Date   NA 141 01/30/2023   K 3.9 01/30/2023   CL 103 01/30/2023   CO2 23 01/30/2023   Lab Results  Component Value Date   ALT 22 01/30/2023   AST 19 01/30/2023   ALKPHOS 50 01/30/2023   BILITOT 0.6 01/30/2023     RADIOGRAPHY: No results found.    IMPRESSION/PLAN: 1. 72 y.o. gentleman with glioblastoma, WHO grade 4, s/p resection 02/01/23  Patient has healed well from his resection with Dr. Lovell Sheehan. Post-operative imaging suggests a total gross resection. He currently has a good functional status. At this time, he is a good candidate for adjuvant chemoradiation therapy. Patient has met with Dr. Barbaraann Cao and was considered suitable for Temodar therapy. We recommend 6 weeks of  radiation treatment to the resection cavity to decrease his risk of locoregional recurrence.   We discussed the nature of WHO grade 4 glioblastomas and the role radiation plays in treatment. We explained the logistics of treatment along with its benefits, risks, and possible side  effects. All questions were answered to the patient's satisfaction. He expressed understanding of the treatment and would like to proceed. A consent form was reviewed and signed freely today.   PLAN: We will share our discussion with Dr. Barbaraann Cao today. Patient is scheduled for CT simulation on 03/06/23. We look forward to participating in his care.    I personally spent 60 minutes in this encounter including chart review, reviewing radiological studies, meeting face-to-face with the patient, entering orders and completing documentation.     Joyice Faster, PA-C    Margaretmary Dys, MD  [facility]  Radiation Oncology Direct Dial: [phone]  Fax: [phone] [email or web address]  Skype  [email or web address]   This document serves as a record of services personally performed by Margaretmary Dys, MD and Joyice Faster, PA-C. It was created on their behalf by Mickie Bail, a trained medical scribe. The creation of this record is based on the scribe's personal observations and the provider's statements to them. This document has been checked and approved by the attending provider.

## 2023-03-05 ENCOUNTER — Other Ambulatory Visit (HOSPITAL_COMMUNITY): Payer: Self-pay

## 2023-03-05 ENCOUNTER — Other Ambulatory Visit: Payer: Self-pay | Admitting: Pharmacy Technician

## 2023-03-05 ENCOUNTER — Other Ambulatory Visit: Payer: Self-pay

## 2023-03-05 NOTE — Progress Notes (Signed)
Specialty Pharmacy Initial Fill Coordination Note  Juan Garrett is a 72 y.o. male contacted today regarding refills of specialty medication(s) Temozolomide .  Patient requested [name] at [facility] at [place]  on 03/06/23   Medication will be filled on 03/06/23. Medication had to be ordered for full quantity.   Patient is aware of $150.21 copayment.

## 2023-03-05 NOTE — Progress Notes (Signed)
Note for patient education left in EPIC on 03/05/23.

## 2023-03-05 NOTE — Telephone Encounter (Signed)
Clinical Pharmacist Practitioner Encounter   Patient plans on picking up his medication from [facility] (Specialty) on 03/06/23. He know he will start the temozolomide along with his radiation.   Patient Education I spoke with patient for overview of new oral chemotherapy medication: Temodar (temozolomide) for the treatment of GBM in conjunction with radiation, planned duration until the end of radiation and potential as maintenance post radiation. .   Counseled patient on administration, dosing, side effects, monitoring, drug-food interactions, safe handling, storage, and disposal. Patient will take 1 capsule (140 mg total) by mouth daily. May take on an empty stomach to decrease nausea & vomiting.  He will also take ondansetron 1 tablet (8 mg total) by mouth every 8 (eight) hours as needed for nausea or vomiting. May take 30-60 minutes prior to Temodar administration if nausea/vomiting occurs as needed.   Side effects include but not limited to: nausea, constipation, decreased wbc/plt/hgb.     Reviewed with patient importance of keeping a medication schedule and plan for any missed doses.  After discussion with patient no patient barriers to medication adherence identified.   Mr. Lerew voiced understanding and appreciation. All questions answered. Medication handout provided.  Provided patient with Oral Chemotherapy Navigation Clinic phone number. Patient knows to call the office with questions or concerns. Oral Chemotherapy Navigation Clinic will continue to follow.  Remi Haggard, PharmD, BCPS, BCOP, CPP Hematology/Oncology Clinical Pharmacist Practitioner [facility] [phone]  03/05/2023 10:48 AM

## 2023-03-06 ENCOUNTER — Other Ambulatory Visit (HOSPITAL_COMMUNITY): Payer: Self-pay

## 2023-03-06 ENCOUNTER — Other Ambulatory Visit: Payer: Self-pay

## 2023-03-06 ENCOUNTER — Ambulatory Visit
Admission: RE | Admit: 2023-03-06 | Discharge: 2023-03-06 | Disposition: A | Discharge status: Home / Self Care | Payer: Medicare Other | Source: Ambulatory Visit | Attending: Radiation Oncology | Admitting: Radiation Oncology

## 2023-03-06 DIAGNOSIS — C719 Malignant neoplasm of brain, unspecified: Secondary | ICD-10-CM | POA: Diagnosis not present

## 2023-03-06 DIAGNOSIS — C712 Malignant neoplasm of temporal lobe: Secondary | ICD-10-CM | POA: Insufficient documentation

## 2023-03-07 ENCOUNTER — Other Ambulatory Visit: Payer: Self-pay

## 2023-03-07 DIAGNOSIS — C719 Malignant neoplasm of brain, unspecified: Secondary | ICD-10-CM | POA: Diagnosis not present

## 2023-03-07 NOTE — Progress Notes (Signed)
Radiation Oncology         [phone]) [phone] ________________________________  Name: Juan Garrett MRN: [id number]  Date: 03/06/2023  DOB: 04/25/1951  SIMULATION AND TREATMENT PLANNING NOTE    ICD-10-CM   1. Malignant neoplasm of temporal lobe of brain (HCC)  C71.2     2. Glioblastoma, IDH-wildtype (HCC)  C71.9       DIAGNOSIS:  72 yo gentleman with right temporal 7 cm glioblastoma, WHO grade 4; s/p gross total resection 02/01/23  NARRATIVE:  The patient was brought to the CT Simulation planning suite.  Identity was confirmed.  All relevant records and images related to the planned course of therapy were reviewed.  The patient freely provided informed written consent to proceed with treatment after reviewing the details related to the planned course of therapy. The consent form was witnessed and verified by the simulation staff.  Then, the patient was set-up in a stable reproducible  supine position for radiation therapy.  CT images were obtained.  Surface markings were placed.  The CT images were loaded into the planning software.  Then the planning CT was fused with the MRI by our physics staff.  The target and avoidance structures were contoured.  Treatment planning then occurred.  The radiation prescription was entered and confirmed.  I designed and supervised the construction of a total of one medically necessary complex treatment device consisting of thermoplastic mask used for immobilization.  IMAGE GUIDANCE:  The patient will undergo megavoltage CT imaging prior to each fraction with precision couch adjustments for image guided radiotherapy. The initial tumor volume plus edema plus a 1 cm margin will be treated using VMAT intensity modulated radiotherapy with 6 megavolt x-rays.  For the boost, the initial tumor volume plus a 1 cm margin was treated using VMAT intensity modulated radiotherapy with 6 megavolt x-rays  PLAN TYPE:  I have requested : Intensity Modulated Radiotherapy  (IMRT) is medically necessary for this case for the following reason:  Critical CNS structure avoidance - brainstem, optic chiasm, optic nerve.Marland Kitchen  SPECIAL TREATMENT PROCEDURE:  The planned course of therapy using radiation constitutes a special treatment procedure. Special care is required in the management of this patient for the following reasons. This treatment constitutes a Special Treatment Procedure for the following reason: [ Concurrent chemotherapy requiring careful monitoring for increased toxicities of treatment including weekly laboratory values..  The special nature of the planned course of radiotherapy will require increased physician supervision and oversight to ensure patient's safety with optimal treatment outcomes.  PLAN:  The glioblastoma site will be treated to a total dose of 60 Gy in 30 fractions using the following plan:  1.  The initial tumor volume plus edema plus a 1 cm margin was treated to 44 Gy in 22 fractions of 2 Gy  2.  The initial tumor volume plus a 1 cm margin was boosted to 60 Gy with 8 additional fractions of 2 Gy  ________________________________  Artist Pais Kathrynn Running, M.D.

## 2023-03-08 ENCOUNTER — Other Ambulatory Visit (HOSPITAL_COMMUNITY): Payer: Self-pay

## 2023-03-08 ENCOUNTER — Telehealth: Payer: Self-pay | Admitting: Internal Medicine

## 2023-03-08 NOTE — Telephone Encounter (Signed)
Scheduled per 09/26 staff message, patient has been called and notified of upcoming appointments.

## 2023-03-09 ENCOUNTER — Other Ambulatory Visit: Payer: Self-pay

## 2023-03-09 DIAGNOSIS — C712 Malignant neoplasm of temporal lobe: Secondary | ICD-10-CM | POA: Diagnosis not present

## 2023-03-11 ENCOUNTER — Encounter (HOSPITAL_COMMUNITY): Payer: Self-pay

## 2023-03-14 DIAGNOSIS — C712 Malignant neoplasm of temporal lobe: Secondary | ICD-10-CM | POA: Insufficient documentation

## 2023-03-18 ENCOUNTER — Other Ambulatory Visit: Payer: Self-pay | Admitting: *Deleted

## 2023-03-18 ENCOUNTER — Ambulatory Visit: Payer: Medicare Other

## 2023-03-18 ENCOUNTER — Other Ambulatory Visit: Payer: Self-pay | Admitting: Internal Medicine

## 2023-03-18 ENCOUNTER — Telehealth: Payer: Self-pay | Admitting: *Deleted

## 2023-03-18 ENCOUNTER — Telehealth: Payer: Self-pay | Admitting: Radiation Oncology

## 2023-03-18 DIAGNOSIS — C719 Malignant neoplasm of brain, unspecified: Secondary | ICD-10-CM

## 2023-03-18 NOTE — Telephone Encounter (Signed)
10/7 @ 8:41 am Received fax from call cancer stating patient would like to cancel his treatment appointment for Monday-Wednesday, due to death in the family.  Email sent to Support RTT, copied L3 machine, Ashlyn Bruning and Chesley Noon, RN, so they are aware.

## 2023-03-18 NOTE — Telephone Encounter (Signed)
Patient called requesting a new scan per request from Dr Zachery Conch at [facility].  Stating that they need a new one for Protocol eligibility.  Dr Barbaraann Cao ordered scan.  We will need to push scan once completed.

## 2023-03-19 ENCOUNTER — Ambulatory Visit: Payer: Medicare Other

## 2023-03-19 ENCOUNTER — Other Ambulatory Visit: Payer: Self-pay

## 2023-03-20 ENCOUNTER — Ambulatory Visit: Payer: Medicare Other

## 2023-03-20 ENCOUNTER — Ambulatory Visit (HOSPITAL_COMMUNITY)
Admission: RE | Admit: 2023-03-20 | Discharge: 2023-03-20 | Disposition: A | Discharge status: Home / Self Care | Payer: Medicare Other | Source: Ambulatory Visit | Attending: Internal Medicine | Admitting: Internal Medicine

## 2023-03-20 DIAGNOSIS — C719 Malignant neoplasm of brain, unspecified: Secondary | ICD-10-CM | POA: Insufficient documentation

## 2023-03-20 DIAGNOSIS — R930 Abnormal findings on diagnostic imaging of skull and head, not elsewhere classified: Secondary | ICD-10-CM | POA: Diagnosis not present

## 2023-03-20 DIAGNOSIS — C712 Malignant neoplasm of temporal lobe: Secondary | ICD-10-CM | POA: Diagnosis not present

## 2023-03-20 MED ORDER — GADOBUTROL 1 MMOL/ML IV SOLN
8.0000 mL | Freq: Once | INTRAVENOUS | Status: AC | PRN
  Administered 2023-03-20: 8 mL via INTRAVENOUS

## 2023-03-21 ENCOUNTER — Ambulatory Visit: Payer: Medicare Other

## 2023-03-21 ENCOUNTER — Other Ambulatory Visit: Payer: Self-pay

## 2023-03-22 ENCOUNTER — Telehealth: Payer: Self-pay | Admitting: *Deleted

## 2023-03-22 ENCOUNTER — Ambulatory Visit: Payer: Medicare Other

## 2023-03-22 NOTE — Telephone Encounter (Signed)
Received vm call from pt asking about results of recent MRI.  Called pt & informed that Dr Ulyses Southward were not in office but would let Dr know to call him.  He asked if films could be sent to [facility]/Dr Zachery Conch.  Called Vanda/radiology reading room & she will send films to [facility] & let pt know when sent.

## 2023-03-23 ENCOUNTER — Other Ambulatory Visit (HOSPITAL_COMMUNITY): Payer: Medicare Other

## 2023-03-25 ENCOUNTER — Ambulatory Visit: Payer: Medicare Other

## 2023-03-26 ENCOUNTER — Ambulatory Visit: Payer: Medicare Other

## 2023-03-27 ENCOUNTER — Ambulatory Visit: Payer: Medicare Other

## 2023-03-28 ENCOUNTER — Ambulatory Visit: Payer: Medicare Other

## 2023-03-29 ENCOUNTER — Other Ambulatory Visit: Payer: Self-pay

## 2023-03-29 ENCOUNTER — Telehealth: Payer: Self-pay

## 2023-03-29 ENCOUNTER — Ambulatory Visit: Payer: Medicare Other

## 2023-03-29 NOTE — Telephone Encounter (Signed)
Pt LVM inquiring if he should plan to come to his appts for Monday 10/21. This RN called back and LVM stating that he should plan to proceed to come to appts and to call back if he has any more questions.

## 2023-03-30 ENCOUNTER — Other Ambulatory Visit: Payer: Self-pay

## 2023-04-01 ENCOUNTER — Ambulatory Visit: Payer: Medicare Other

## 2023-04-01 ENCOUNTER — Inpatient Hospital Stay: Payer: Medicare Other | Admitting: Internal Medicine

## 2023-04-01 ENCOUNTER — Inpatient Hospital Stay: Payer: Medicare Other | Attending: Internal Medicine

## 2023-04-01 VITALS — BP 134/64 | HR 71 | Temp 97.7°F | Resp 20 | Wt 179.4 lb

## 2023-04-01 DIAGNOSIS — C719 Malignant neoplasm of brain, unspecified: Secondary | ICD-10-CM

## 2023-04-01 DIAGNOSIS — C712 Malignant neoplasm of temporal lobe: Secondary | ICD-10-CM | POA: Diagnosis not present

## 2023-04-01 LAB — CBC WITH DIFFERENTIAL (CANCER CENTER ONLY)
Abs Immature Granulocytes: 0.02 10*3/uL (ref 0.00–0.07)
Basophils Absolute: 0.1 10*3/uL (ref 0.0–0.1)
Basophils Relative: 1 %
Eosinophils Absolute: 0.1 10*3/uL (ref 0.0–0.5)
Eosinophils Relative: 2 %
HCT: 41.7 % (ref 39.0–52.0)
Hemoglobin: 14.5 g/dL (ref 13.0–17.0)
Immature Granulocytes: 0 %
Lymphocytes Relative: 31 %
Lymphs Abs: 2 10*3/uL (ref 0.7–4.0)
MCH: 29.9 pg (ref 26.0–34.0)
MCHC: 34.8 g/dL (ref 30.0–36.0)
MCV: 86 fL (ref 80.0–100.0)
Monocytes Absolute: 0.5 10*3/uL (ref 0.1–1.0)
Monocytes Relative: 8 %
Neutro Abs: 3.7 10*3/uL (ref 1.7–7.7)
Neutrophils Relative %: 58 %
Platelet Count: 240 10*3/uL (ref 150–400)
RBC: 4.85 MIL/uL (ref 4.22–5.81)
RDW: 13 % (ref 11.5–15.5)
WBC Count: 6.4 10*3/uL (ref 4.0–10.5)
nRBC: 0 % (ref 0.0–0.2)

## 2023-04-01 LAB — CMP (CANCER CENTER ONLY)
ALT: 19 U/L (ref 0–44)
AST: 16 U/L (ref 15–41)
Albumin: 4.1 g/dL (ref 3.5–5.0)
Alkaline Phosphatase: 45 U/L (ref 38–126)
Anion gap: 4 — ABNORMAL LOW (ref 5–15)
BUN: 21 mg/dL (ref 8–23)
CO2: 30 mmol/L (ref 22–32)
Calcium: 9.3 mg/dL (ref 8.9–10.3)
Chloride: 108 mmol/L (ref 98–111)
Creatinine: 1.32 mg/dL — ABNORMAL HIGH (ref 0.61–1.24)
GFR, Estimated: 57 mL/min — ABNORMAL LOW (ref >60)
Glucose, Bld: 134 mg/dL — ABNORMAL HIGH (ref 70–99)
Potassium: 4.4 mmol/L (ref 3.5–5.1)
Sodium: 142 mmol/L (ref 135–145)
Total Bilirubin: 0.6 mg/dL (ref 0.3–1.2)
Total Protein: 6.7 g/dL (ref 6.5–8.1)

## 2023-04-01 NOTE — Progress Notes (Signed)
[facility] at [facility] [address]  [place], [place] [address] [phone]   Interval Evaluation  Date of Service: 04/01/23 Patient Name: Juan Garrett Patient MRN: [id number] Patient DOB: [birth year] Provider: Henreitta Leber, MD  Identifying Statement:  Willard Broome is a 72 y.o. male with right temporal glioblastoma   Oncologic History: Oncology History  Glioblastoma, IDH-wildtype (HCC)  01/31/2023 Surgery   Craniotomy, resection of right temporal mass with Dr. Lovell Sheehan; pathology demonstrates Glioblastoma, IDH-wt   03/07/2023 -  Chemotherapy   Patient is on Treatment Plan : BRAIN GLIOBLASTOMA Radiation Therapy With Concurrent Temozolomide 75 mg/m2 Daily Followed By Sequential Maintenance Temozolomide x 6-12 cycles       Biomarkers:  MGMT Unknown.  IDH 1/2 Wild type.  EGFR Unknown  TERT Unknown   Interval History: Juan Garrett presents today for clinical follow up.  He is feeling well, no new or progressive neurologic deficits.  He has repeat craniotomy scheduled with [facility] team later this week.  Denies headaches, seizures.  No longer dosing decadron, keppra.   H+P (02/28/23) Patient presented to neurologic attention in August 2024 with several weeks of progressive confusion, disorientation, and several falls.  CNS imaging demonstrated large cystic mass in right temporal lobe.  He underwent craniotomy, resection with Dr. Lovell Sheehan on 01/31/23 without complication, pathology demonstrated glioblastoma.  Since surgery, he has been at home, doing well.  No issues with gait, no recurrence of confusion, visual spatial issues.  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  Medications: Current Outpatient Medications on File Prior to Visit  Medication Sig Dispense Refill   amLODipine-benazepril (LOTREL) 5-10 MG capsule Take 1 capsule by mouth daily.     atorvastatin (LIPITOR) 10 MG tablet Take 10 mg by mouth daily.     famotidine  (PEPCID) 40 MG tablet Take 40 mg by mouth daily.     Fexofenadine HCl (ALLEGRA PO) Take by mouth daily.     fluticasone (FLONASE) 50 MCG/ACT nasal spray Place 1 spray into both nostrils daily.     Fluticasone Furoate (ARNUITY ELLIPTA) 50 MCG/ACT AEPB      HYDROcodone-acetaminophen (NORCO/VICODIN) 5-325 MG tablet Take 1 tablet by mouth every 4 (four) hours as needed for moderate pain. 30 tablet 0   ibuprofen (ADVIL) 200 MG tablet Take 200 mg by mouth daily as needed for mild pain.     levETIRAcetam (KEPPRA) 500 MG tablet Take 1 tablet (500 mg total) by mouth 2 (two) times daily. (Patient not taking: Reported on 02/28/2023) 30 tablet 0   ondansetron (ZOFRAN) 4 MG tablet Take 1 tablet (4 mg total) by mouth every 8 (eight) hours as needed for nausea. 30 tablet 1   ondansetron (ZOFRAN) 8 MG tablet Take 1 tablet (8 mg total) by mouth every 8 (eight) hours as needed for nausea or vomiting. May take 30-60 minutes prior to Temodar administration if nausea/vomiting occurs as needed. 30 tablet 1   predniSONE (DELTASONE) 10 MG tablet Take 1 tablet (10 mg total) by mouth daily with breakfast. 30 tablet 0   temozolomide (TEMODAR) 140 MG capsule Take 1 capsule (140 mg total) by mouth daily. May take on an empty stomach to decrease nausea & vomiting. 42 capsule 0   Vitamin D, Ergocalciferol, (DRISDOL) 1.25 MG (50000 UNIT) CAPS capsule Take 50,000 Units by mouth every 14 (fourteen) days.     No current facility-administered medications on file prior to visit.    Allergies:  Allergies  Allergen Reactions  Aspirin Other (See Comments)    Upset stomach   Past Medical History:  Past Medical History:  Diagnosis Date   Allergy    seasonal allergies   Arthritis    bilateral knees   Cataract    not a surgical candidate at this time (07/06/2020)   GERD (gastroesophageal reflux disease)    on meds   Hypertension    on meds   Past Surgical History:  Past Surgical History:  Procedure Laterality Date    APPLICATION OF CRANIAL NAVIGATION N/A 01/31/2023   Procedure: APPLICATION OF CRANIAL NAVIGATION;  Surgeon: Tressie Stalker, MD;  Location: [facility] OR;  Service: Neurosurgery;  Laterality: N/A;   CARPAL TUNNEL RELEASE Bilateral 1997   CHONDROPLASTY Right 09/23/2019   Procedure: CHONDROPLASTY;  Surgeon: Tarry Kos, MD;  Location: [facility];  Service: Orthopedics;  Laterality: Right;   CRANIOTOMY Right 01/31/2023   Procedure: CRANIOTOMY TUMOR EXCISION;  Surgeon: Tressie Stalker, MD;  Location: [facility] OR;  Service: Neurosurgery;  Laterality: Right;   KNEE ARTHROSCOPY WITH MEDIAL MENISECTOMY Right 09/23/2019   Procedure: RIGHT KNEE ARTHROSCOPY WITH PARTIAL MEDIAL MENISCECTOMY;  Surgeon: Tarry Kos, MD;  Location: [facility];  Service: Orthopedics;  Laterality: Right;   KNEE SURGERY Left 1992   Social History:  Social History   Socioeconomic History   Marital status: Married    Spouse name: Not on file   Number of children: Not on file   Years of education: Not on file   Highest education level: Not on file  Occupational History   Not on file  Tobacco Use   Smoking status: Former    Types: Cigarettes   Smokeless tobacco: Never  Vaping Use   Vaping status: Never Used  Substance and Sexual Activity   Alcohol use: Not Currently   Drug use: Never   Sexual activity: Not on file  Other Topics Concern   Not on file  Social History Narrative   Not on file   Social Determinants of Health   Financial Resource Strain: Not on file  Food Insecurity: No Food Insecurity (03/04/2023)   Hunger Vital Sign    Worried About Running Out of Food in the Last Year: Never true    Ran Out of Food in the Last Year: Never true  Transportation Needs: No Transportation Needs (03/04/2023)   PRAPARE - Administrator, Civil Service (Medical): No    Lack of Transportation (Non-Medical): No  Physical Activity: Not on file  Stress: Not on file  Social Connections: Not on  file  Intimate Partner Violence: Not At Risk (03/04/2023)   Humiliation, Afraid, Rape, and Kick questionnaire    Fear of Current or Ex-Partner: No    Emotionally Abused: No    Physically Abused: No    Sexually Abused: No   Family History:  Family History  Problem Relation Age of Onset   Colon polyps Neg Hx    Colon cancer Neg Hx    Esophageal cancer Neg Hx    Stomach cancer Neg Hx    Rectal cancer Neg Hx     Review of Systems: Constitutional: Doesn't report fevers, chills or abnormal weight loss Eyes: Doesn't report blurriness of vision Ears, nose, mouth, throat, and face: Doesn't report sore throat Respiratory: Doesn't report cough, dyspnea or wheezes Cardiovascular: Doesn't report palpitation, chest discomfort  Gastrointestinal:  Doesn't report nausea, constipation, diarrhea GU: Doesn't report incontinence Skin: Doesn't report skin rashes Neurological: Per HPI Musculoskeletal: Doesn't report joint  pain Behavioral/Psych: Doesn't report anxiety  Physical Exam: Vitals:   04/01/23 1225  BP: 134/64  Pulse: 71  Resp: 20  Temp: 97.7 F (36.5 C)  SpO2: 98%    KPS: 90. General: Alert, cooperative, pleasant, in no acute distress Head: Normal EENT: No conjunctival injection or scleral icterus.  Lungs: Resp effort normal Cardiac: Regular rate Abdomen: Non-distended abdomen Skin: No rashes cyanosis or petechiae. Extremities: No clubbing or edema  Neurologic Exam: Mental Status: Awake, alert, attentive to examiner. Oriented to self and environment. Language is fluent with intact comprehension.  Cranial Nerves: Visual acuity is grossly normal. Visual fields are full. Extra-ocular movements intact. No ptosis. Face is symmetric Motor: Tone and bulk are normal. Power is full in both arms and legs. Reflexes are symmetric, no pathologic reflexes present.  Sensory: Intact to light touch Gait: Normal.   Labs: I have reviewed the data as listed    Component Value Date/Time    NA 141 01/30/2023 1233   K 3.9 01/30/2023 1233   CL 103 01/30/2023 1233   CO2 23 01/30/2023 1233   GLUCOSE 111 (H) 01/30/2023 1233   BUN 22 01/30/2023 1233   CREATININE 1.12 01/30/2023 1233   CALCIUM 9.6 01/30/2023 1233   PROT 6.8 01/30/2023 1233   ALBUMIN 3.8 01/30/2023 1233   AST 19 01/30/2023 1233   ALT 22 01/30/2023 1233   ALKPHOS 50 01/30/2023 1233   BILITOT 0.6 01/30/2023 1233   GFRNONAA >60 01/30/2023 1233   Lab Results  Component Value Date   WBC 6.4 04/01/2023   NEUTROABS 3.7 04/01/2023   HGB 14.5 04/01/2023   HCT 41.7 04/01/2023   MCV 86.0 04/01/2023   PLT 240 04/01/2023    Imaging MR BRAIN W WO CONTRAST  Result Date: 03/21/2023 CLINICAL DATA:  Initial evaluation for brain/CNS neoplasm, assess treatment response. History of right temporal glioblastoma, IDH wild-type. EXAM: MRI HEAD WITHOUT AND WITH CONTRAST TECHNIQUE: Multiplanar, multiecho pulse sequences of the brain and surrounding structures were obtained without and with intravenous contrast. CONTRAST:  8mL GADAVIST GADOBUTROL 1 MMOL/ML IV SOLN COMPARISON:  Previous brain MRI from 02/01/2023 FINDINGS: Brain: Postoperative changes from prior right temporal craniotomy for tumor resection again seen. Dural thickening and enhancement underlying the craniotomy site favored to be postoperative. Chronic hemosiderin deposition noted about the resection cavity. There are increased T2 hyperintense cystic changes with irregular peripheral postcontrast enhancement within the resection cavity, concerning for recurrent tumor/disease progression. Enhancing component now measures approximately 3.7 x 2.2 x 1.8 cm (AP by transverse by craniocaudad) (series 16, image 69). An additional 3 mm punctate satellite focus of enhancement noted posteriorly within the right temporal lobe (series 16, image 72). Surrounding nonenhancing T2/FLAIR signal abnormality within the adjacent right temporal lobe extending towards the right basal ganglia and  periatrial white matter. Associated regional mass effect within this region, although previously seen right-to-left shift has largely resolved. Background T2/FLAIR hyperintensities elsewhere within the brain noted, similar to prior, and likely chronic microvascular ischemic disease. Small remote left cerebellar infarct noted. No evidence for acute or recent infarct. No other acute or chronic intracranial blood products. 2 mm focus of enhancement within the right internal auditory canal noted, stable. No other mass lesion or abnormal enhancement. Pituitary gland suprasellar region within normal limits. Vascular: Major intracranial vascular flow voids are maintained. Skull and upper cervical spine: Craniocervical junction within normal limits. Bone marrow signal intensity normal. No acute scalp soft tissue abnormality. Sinuses/Orbits: Globes and orbital soft tissues within normal limits. Paranasal sinuses  are largely clear. No mastoid effusion. Other: None. IMPRESSION: 1. Postoperative changes from prior right temporal craniotomy for tumor resection. Increased T2 hyperintense cystic changes with irregular peripheral postcontrast enhancement within the resection cavity, concerning for recurrent tumor/disease progression. Persistent surrounding nonenhancing T2/FLAIR signal abnormality within the adjacent right temporal lobe, although previously seen midline shift has largely resolved. 2. New 3 mm satellite focus of enhancement within the posterior right temporal lobe as above. Attention at follow-up recommended. 3. Stable 2 mm focus of enhancement within the right IAC, presumably a small vestibular schwannoma. Electronically Signed   By: Rise Mu M.D.   On: 03/21/2023 05:23    Pathology: SURGICAL PATHOLOGY  CASE: [id number]  PATIENT: Virlan Runyon  Surgical Pathology Report   Clinical History: brain tumor (cm)   FINAL MICROSCOPIC DIAGNOSIS:   A. BRAIN TUMOR, RIGHT TEMPORAL, EXCISION:  -  Glioblastoma, IDH-wild-type (WHO grade 4), see comment   COMMENT:   This case was sent in consultation to Dr. Foye Deer at [facility], [place], [place].  A complete copy of the outside  pathology report is available in patient's electronic medical records.   GROSS DESCRIPTION:   Received fresh are portions of soft tan-white to red tissue measuring  2.5 x 2.2 x 0.8 cm in aggregate.  The specimen is sectioned and entirely  submitted in 3 cassettes.  [facility] 02/01/2023)    Final Diagnosis performed by Holley Bouche, MD.   Electronically  signed 02/14/2023    Assessment/Plan Glioblastoma, IDH-wildtype [facility])  Dennie Fetters [place] is clinically stable today.  He has upcoming craniotomy with Dr. Zachery Conch at [facility] on the books for this Friday.  One surgery is completed, we ultimately recommended proceeding with course of intensity modulated radiation therapy and concurrent daily Temozolomide.  Radiation will be administered Mon-Fri over 6 weeks, Temodar will be dosed at 75mg /m2 to be given daily over 42 days.  We reviewed side effects of temodar, including fatigue, nausea/vomiting, constipation, and cytopenias.  Informed consent was verbally obtained at bedside to proceed with oral chemotherapy.  Chemotherapy should be held for the following:  ANC less than 1,000  Platelets less than 100,000  LFT or creatinine greater than 2x ULN  If clinical concerns/contraindications develop  Every 2 weeks during radiation, labs will be checked accompanied by a clinical evaluation in the brain tumor clinic.  All questions were answered. The patient knows to call the clinic with any problems, questions or concerns. No barriers to learning were detected.  The total time spent in the encounter was 40 minutes and more than 50% was on counseling and review of test results   Henreitta Leber, MD Medical Director of Neuro-Oncology [facility] at [facility] 04/01/23  12:27 PM

## 2023-04-02 ENCOUNTER — Other Ambulatory Visit: Payer: Self-pay

## 2023-04-02 ENCOUNTER — Ambulatory Visit: Payer: Medicare Other

## 2023-04-02 ENCOUNTER — Other Ambulatory Visit (HOSPITAL_COMMUNITY): Payer: Self-pay

## 2023-04-02 ENCOUNTER — Other Ambulatory Visit: Payer: Self-pay | Admitting: Radiation Therapy

## 2023-04-02 DIAGNOSIS — N182 Chronic kidney disease, stage 2 (mild): Secondary | ICD-10-CM | POA: Diagnosis not present

## 2023-04-02 DIAGNOSIS — C719 Malignant neoplasm of brain, unspecified: Secondary | ICD-10-CM

## 2023-04-02 DIAGNOSIS — I129 Hypertensive chronic kidney disease with stage 1 through stage 4 chronic kidney disease, or unspecified chronic kidney disease: Secondary | ICD-10-CM | POA: Diagnosis not present

## 2023-04-02 DIAGNOSIS — K219 Gastro-esophageal reflux disease without esophagitis: Secondary | ICD-10-CM | POA: Diagnosis not present

## 2023-04-02 DIAGNOSIS — Z79899 Other long term (current) drug therapy: Secondary | ICD-10-CM | POA: Diagnosis not present

## 2023-04-02 DIAGNOSIS — D496 Neoplasm of unspecified behavior of brain: Secondary | ICD-10-CM | POA: Diagnosis not present

## 2023-04-02 DIAGNOSIS — Z01818 Encounter for other preprocedural examination: Secondary | ICD-10-CM | POA: Diagnosis not present

## 2023-04-02 DIAGNOSIS — N183 Chronic kidney disease, stage 3 unspecified: Secondary | ICD-10-CM | POA: Diagnosis not present

## 2023-04-02 DIAGNOSIS — E785 Hyperlipidemia, unspecified: Secondary | ICD-10-CM | POA: Diagnosis not present

## 2023-04-02 DIAGNOSIS — C712 Malignant neoplasm of temporal lobe: Secondary | ICD-10-CM | POA: Diagnosis not present

## 2023-04-02 DIAGNOSIS — I10 Essential (primary) hypertension: Secondary | ICD-10-CM | POA: Diagnosis not present

## 2023-04-02 NOTE — Progress Notes (Signed)
Called patient for new therapy check in. Patient has not started Temodar. He has the full bottle (42 day supply) at home. He is scheduled to have surgery on 10/25 and start Temodar 3 weeks later ~ 11/15. Will pend new therapy check in and refill outreach closer to this time. Patient is aware he can call the pharmacy anytime beforehand with questions or concerns.

## 2023-04-03 ENCOUNTER — Ambulatory Visit: Payer: Medicare Other

## 2023-04-03 ENCOUNTER — Other Ambulatory Visit: Payer: Self-pay

## 2023-04-04 ENCOUNTER — Other Ambulatory Visit: Payer: Self-pay

## 2023-04-04 ENCOUNTER — Ambulatory Visit: Payer: Medicare Other

## 2023-04-05 ENCOUNTER — Ambulatory Visit: Payer: Medicare Other

## 2023-04-06 ENCOUNTER — Other Ambulatory Visit: Payer: Self-pay

## 2023-04-08 ENCOUNTER — Ambulatory Visit: Payer: Medicare Other

## 2023-04-09 ENCOUNTER — Telehealth: Payer: Self-pay | Admitting: *Deleted

## 2023-04-09 ENCOUNTER — Inpatient Hospital Stay
Admission: RE | Admit: 2023-04-09 | Discharge: 2023-04-09 | Disposition: A | Discharge status: Home / Self Care | Payer: Self-pay | Source: Ambulatory Visit | Attending: Internal Medicine | Admitting: Internal Medicine

## 2023-04-09 ENCOUNTER — Ambulatory Visit: Payer: Medicare Other

## 2023-04-09 ENCOUNTER — Other Ambulatory Visit: Payer: Self-pay | Admitting: *Deleted

## 2023-04-09 DIAGNOSIS — C719 Malignant neoplasm of brain, unspecified: Secondary | ICD-10-CM

## 2023-04-09 NOTE — Telephone Encounter (Signed)
Requested that [facility] send images from post operative brain MRI on 04/05/23 to [facility].

## 2023-04-10 ENCOUNTER — Ambulatory Visit: Payer: Medicare Other

## 2023-04-11 ENCOUNTER — Ambulatory Visit: Payer: Medicare Other

## 2023-04-12 ENCOUNTER — Ambulatory Visit: Payer: Medicare Other

## 2023-04-15 ENCOUNTER — Inpatient Hospital Stay
Admission: RE | Admit: 2023-04-15 | Discharge: 2023-04-15 | Disposition: A | Discharge status: Home / Self Care | Payer: Self-pay | Source: Ambulatory Visit | Attending: Radiation Oncology | Admitting: Radiation Oncology

## 2023-04-15 ENCOUNTER — Other Ambulatory Visit: Payer: Self-pay | Admitting: Radiation Oncology

## 2023-04-15 ENCOUNTER — Inpatient Hospital Stay: Payer: Medicare Other | Admitting: Internal Medicine

## 2023-04-15 ENCOUNTER — Ambulatory Visit: Payer: Medicare Other

## 2023-04-15 ENCOUNTER — Inpatient Hospital Stay: Payer: Medicare Other

## 2023-04-15 ENCOUNTER — Ambulatory Visit
Admission: RE | Admit: 2023-04-15 | Discharge: 2023-04-15 | Disposition: A | Discharge status: Home / Self Care | Payer: Medicare Other | Source: Ambulatory Visit | Attending: Radiation Oncology | Admitting: Radiation Oncology

## 2023-04-15 DIAGNOSIS — C719 Malignant neoplasm of brain, unspecified: Secondary | ICD-10-CM

## 2023-04-15 DIAGNOSIS — Z9889 Other specified postprocedural states: Secondary | ICD-10-CM | POA: Diagnosis not present

## 2023-04-15 DIAGNOSIS — R7309 Other abnormal glucose: Secondary | ICD-10-CM | POA: Diagnosis not present

## 2023-04-15 DIAGNOSIS — E785 Hyperlipidemia, unspecified: Secondary | ICD-10-CM | POA: Diagnosis not present

## 2023-04-15 DIAGNOSIS — Z23 Encounter for immunization: Secondary | ICD-10-CM | POA: Diagnosis not present

## 2023-04-15 DIAGNOSIS — I10 Essential (primary) hypertension: Secondary | ICD-10-CM | POA: Diagnosis not present

## 2023-04-15 DIAGNOSIS — Z0000 Encounter for general adult medical examination without abnormal findings: Secondary | ICD-10-CM | POA: Diagnosis not present

## 2023-04-15 DIAGNOSIS — C712 Malignant neoplasm of temporal lobe: Secondary | ICD-10-CM | POA: Insufficient documentation

## 2023-04-15 DIAGNOSIS — N400 Enlarged prostate without lower urinary tract symptoms: Secondary | ICD-10-CM | POA: Diagnosis not present

## 2023-04-15 NOTE — Progress Notes (Signed)
  Radiation Oncology         [phone]) [phone] ________________________________  Name: Juan Garrett MRN: [id number]  Date: 04/15/2023  DOB: [birth year]  SIMULATION AND TREATMENT PLANNING NOTE    ICD-10-CM   1. Malignant neoplasm of temporal lobe of brain (HCC)  C71.2     2. Glioblastoma, IDH-wildtype (HCC)  C71.9       DIAGNOSIS:  72 yo gentleman with right temporal 7 cm glioblastoma, WHO grade 4; s/p gross total resection 02/01/23 and re-resection on 04/05/23  NARRATIVE:  The patient was brought to the CT Simulation planning suite.  Identity was confirmed.  All relevant records and images related to the planned course of therapy were reviewed.  The patient freely provided informed written consent to proceed with treatment after reviewing the details related to the planned course of therapy. The consent form was witnessed and verified by the simulation staff.  Then, the patient was set-up in a stable reproducible  supine position for radiation therapy.  New CT images were obtained.  Surface markings were placed.  The CT images were loaded into the planning software.  Then the target and avoidance structures were contoured.  Treatment planning then occurred.  The radiation prescription was entered and confirmed.    PLAN:  The patient will receive 60 Gy in 30 fractions, as originally intended with updated planning today based on anatomic changes related to re-resection.  ________________________________  Artist Pais Kathrynn Running, M.D.

## 2023-04-16 ENCOUNTER — Encounter: Payer: Self-pay | Admitting: Internal Medicine

## 2023-04-16 ENCOUNTER — Other Ambulatory Visit: Payer: Self-pay

## 2023-04-16 ENCOUNTER — Ambulatory Visit: Payer: Medicare Other

## 2023-04-17 ENCOUNTER — Ambulatory Visit: Payer: Medicare Other

## 2023-04-18 ENCOUNTER — Ambulatory Visit: Payer: Medicare Other

## 2023-04-19 ENCOUNTER — Telehealth: Payer: Self-pay | Admitting: Medical Oncology

## 2023-04-19 ENCOUNTER — Ambulatory Visit: Payer: Medicare Other

## 2023-04-19 ENCOUNTER — Telehealth: Payer: Self-pay | Admitting: Internal Medicine

## 2023-04-19 DIAGNOSIS — C712 Malignant neoplasm of temporal lobe: Secondary | ICD-10-CM | POA: Diagnosis not present

## 2023-04-19 NOTE — Telephone Encounter (Signed)
Called patient regarding upcoming appointments in December, patient is notified.

## 2023-04-19 NOTE — Telephone Encounter (Signed)
Robin Searing PA_ at [facility] LVM and requested Caris results.

## 2023-04-19 NOTE — Telephone Encounter (Signed)
Faxed Caris moleculars to [employer] at [place].

## 2023-04-22 ENCOUNTER — Ambulatory Visit: Payer: Medicare Other

## 2023-04-22 ENCOUNTER — Ambulatory Visit: Payer: Medicare Other | Admitting: Radiation Oncology

## 2023-04-23 ENCOUNTER — Ambulatory Visit: Payer: Medicare Other

## 2023-04-24 ENCOUNTER — Ambulatory Visit: Payer: Medicare Other

## 2023-04-25 ENCOUNTER — Ambulatory Visit: Payer: Medicare Other

## 2023-04-26 ENCOUNTER — Ambulatory Visit: Payer: Medicare Other

## 2023-04-26 DIAGNOSIS — C719 Malignant neoplasm of brain, unspecified: Secondary | ICD-10-CM | POA: Diagnosis not present

## 2023-04-26 DIAGNOSIS — C712 Malignant neoplasm of temporal lobe: Secondary | ICD-10-CM | POA: Diagnosis not present

## 2023-04-27 ENCOUNTER — Ambulatory Visit: Payer: Medicare Other

## 2023-04-29 ENCOUNTER — Ambulatory Visit: Payer: Medicare Other | Admitting: Internal Medicine

## 2023-04-29 ENCOUNTER — Other Ambulatory Visit: Payer: Medicare Other

## 2023-04-29 ENCOUNTER — Ambulatory Visit: Payer: Medicare Other

## 2023-04-29 ENCOUNTER — Other Ambulatory Visit: Payer: Self-pay

## 2023-04-29 ENCOUNTER — Ambulatory Visit
Admission: RE | Admit: 2023-04-29 | Discharge: 2023-04-29 | Disposition: A | Discharge status: Home / Self Care | Payer: Medicare Other | Source: Ambulatory Visit | Attending: Radiation Oncology | Admitting: Radiation Oncology

## 2023-04-29 DIAGNOSIS — C719 Malignant neoplasm of brain, unspecified: Secondary | ICD-10-CM | POA: Diagnosis not present

## 2023-04-29 DIAGNOSIS — C712 Malignant neoplasm of temporal lobe: Secondary | ICD-10-CM | POA: Diagnosis not present

## 2023-04-29 DIAGNOSIS — Z510 Encounter for antineoplastic radiation therapy: Secondary | ICD-10-CM | POA: Diagnosis not present

## 2023-04-29 LAB — RAD ONC ARIA SESSION SUMMARY
Course Elapsed Days: 0
Plan Fractions Treated to Date: 1
Plan Prescribed Dose Per Fraction: 2 Gy
Plan Total Fractions Prescribed: 22
Plan Total Prescribed Dose: 44 Gy
Reference Point Dosage Given to Date: 2 Gy
Reference Point Session Dosage Given: 2 Gy
Session Number: 1

## 2023-04-30 ENCOUNTER — Ambulatory Visit
Admission: RE | Admit: 2023-04-30 | Discharge: 2023-04-30 | Disposition: A | Discharge status: Home / Self Care | Payer: Medicare Other | Source: Ambulatory Visit | Attending: Radiation Oncology

## 2023-04-30 ENCOUNTER — Other Ambulatory Visit: Payer: Self-pay

## 2023-04-30 ENCOUNTER — Ambulatory Visit: Payer: Medicare Other

## 2023-04-30 DIAGNOSIS — C719 Malignant neoplasm of brain, unspecified: Secondary | ICD-10-CM | POA: Diagnosis not present

## 2023-04-30 DIAGNOSIS — C712 Malignant neoplasm of temporal lobe: Secondary | ICD-10-CM | POA: Diagnosis not present

## 2023-04-30 DIAGNOSIS — Z510 Encounter for antineoplastic radiation therapy: Secondary | ICD-10-CM | POA: Diagnosis not present

## 2023-04-30 LAB — RAD ONC ARIA SESSION SUMMARY
Course Elapsed Days: 1
Plan Fractions Treated to Date: 2
Plan Prescribed Dose Per Fraction: 2 Gy
Plan Total Fractions Prescribed: 22
Plan Total Prescribed Dose: 44 Gy
Reference Point Dosage Given to Date: 4 Gy
Reference Point Session Dosage Given: 2 Gy
Session Number: 2

## 2023-05-01 ENCOUNTER — Ambulatory Visit: Payer: Medicare Other

## 2023-05-01 ENCOUNTER — Other Ambulatory Visit: Payer: Self-pay

## 2023-05-01 ENCOUNTER — Ambulatory Visit
Admission: RE | Admit: 2023-05-01 | Discharge: 2023-05-01 | Disposition: A | Discharge status: Home / Self Care | Payer: Medicare Other | Source: Ambulatory Visit | Attending: Radiation Oncology | Admitting: Radiation Oncology

## 2023-05-01 DIAGNOSIS — Z510 Encounter for antineoplastic radiation therapy: Secondary | ICD-10-CM | POA: Diagnosis not present

## 2023-05-01 DIAGNOSIS — C712 Malignant neoplasm of temporal lobe: Secondary | ICD-10-CM | POA: Diagnosis not present

## 2023-05-01 DIAGNOSIS — C719 Malignant neoplasm of brain, unspecified: Secondary | ICD-10-CM | POA: Diagnosis not present

## 2023-05-01 LAB — RAD ONC ARIA SESSION SUMMARY
Course Elapsed Days: 2
Plan Fractions Treated to Date: 3
Plan Prescribed Dose Per Fraction: 2 Gy
Plan Total Fractions Prescribed: 22
Plan Total Prescribed Dose: 44 Gy
Reference Point Dosage Given to Date: 6 Gy
Reference Point Session Dosage Given: 2 Gy
Session Number: 3

## 2023-05-02 ENCOUNTER — Ambulatory Visit: Payer: Medicare Other

## 2023-05-02 ENCOUNTER — Telehealth: Payer: Self-pay | Admitting: *Deleted

## 2023-05-02 ENCOUNTER — Ambulatory Visit
Admission: RE | Admit: 2023-05-02 | Discharge: 2023-05-02 | Disposition: A | Discharge status: Home / Self Care | Payer: Medicare Other | Source: Ambulatory Visit | Attending: Radiation Oncology | Admitting: Radiation Oncology

## 2023-05-02 ENCOUNTER — Other Ambulatory Visit: Payer: Self-pay

## 2023-05-02 DIAGNOSIS — Z510 Encounter for antineoplastic radiation therapy: Secondary | ICD-10-CM | POA: Diagnosis not present

## 2023-05-02 DIAGNOSIS — C712 Malignant neoplasm of temporal lobe: Secondary | ICD-10-CM | POA: Diagnosis not present

## 2023-05-02 DIAGNOSIS — C719 Malignant neoplasm of brain, unspecified: Secondary | ICD-10-CM | POA: Diagnosis not present

## 2023-05-02 LAB — RAD ONC ARIA SESSION SUMMARY
Course Elapsed Days: 3
Plan Fractions Treated to Date: 4
Plan Prescribed Dose Per Fraction: 2 Gy
Plan Total Fractions Prescribed: 22
Plan Total Prescribed Dose: 44 Gy
Reference Point Dosage Given to Date: 8 Gy
Reference Point Session Dosage Given: 0.4679 Gy
Session Number: 4

## 2023-05-02 NOTE — Telephone Encounter (Signed)
-----   Message from Henreitta Leber sent at 05/01/2023  4:17 PM EST ----- Regarding: RE: Will not continue in absence of seizures ----- Message ----- From: Arville Care, RN Sent: 05/01/2023   4:10 PM EST To: Henreitta Leber, MD  Mr Mcsween called, he was prescribed Keppra 1000 mg bid at [facility].  He has enough to last through Monday.  He is asking if you want him keep taking this & if so, can you send a rx to [facility]?  Darel Hong

## 2023-05-02 NOTE — Telephone Encounter (Signed)
Returned PC to patient regarding question about continuing Keppra, no answer, unable to leave voicemail, mailbox is full.

## 2023-05-03 ENCOUNTER — Ambulatory Visit: Payer: Medicare Other

## 2023-05-03 ENCOUNTER — Ambulatory Visit
Admission: RE | Admit: 2023-05-03 | Discharge: 2023-05-03 | Disposition: A | Discharge status: Home / Self Care | Payer: Medicare Other | Source: Ambulatory Visit | Attending: Radiation Oncology | Admitting: Radiation Oncology

## 2023-05-03 ENCOUNTER — Other Ambulatory Visit: Payer: Self-pay

## 2023-05-03 ENCOUNTER — Ambulatory Visit
Admission: RE | Admit: 2023-05-03 | Discharge: 2023-05-03 | Disposition: A | Discharge status: Home / Self Care | Payer: Medicare Other | Source: Ambulatory Visit | Attending: Radiation Oncology

## 2023-05-03 DIAGNOSIS — Z510 Encounter for antineoplastic radiation therapy: Secondary | ICD-10-CM | POA: Diagnosis not present

## 2023-05-03 DIAGNOSIS — L918 Other hypertrophic disorders of the skin: Secondary | ICD-10-CM | POA: Diagnosis not present

## 2023-05-03 DIAGNOSIS — D225 Melanocytic nevi of trunk: Secondary | ICD-10-CM | POA: Diagnosis not present

## 2023-05-03 DIAGNOSIS — C719 Malignant neoplasm of brain, unspecified: Secondary | ICD-10-CM | POA: Diagnosis not present

## 2023-05-03 DIAGNOSIS — L814 Other melanin hyperpigmentation: Secondary | ICD-10-CM | POA: Diagnosis not present

## 2023-05-03 DIAGNOSIS — C712 Malignant neoplasm of temporal lobe: Secondary | ICD-10-CM | POA: Diagnosis not present

## 2023-05-03 DIAGNOSIS — L821 Other seborrheic keratosis: Secondary | ICD-10-CM | POA: Diagnosis not present

## 2023-05-03 LAB — RAD ONC ARIA SESSION SUMMARY
Course Elapsed Days: 4
Plan Fractions Treated to Date: 5
Plan Prescribed Dose Per Fraction: 2 Gy
Plan Total Fractions Prescribed: 22
Plan Total Prescribed Dose: 44 Gy
Reference Point Dosage Given to Date: 10 Gy
Reference Point Session Dosage Given: 2 Gy
Session Number: 5

## 2023-05-05 ENCOUNTER — Other Ambulatory Visit: Payer: Self-pay

## 2023-05-05 ENCOUNTER — Ambulatory Visit
Admission: RE | Admit: 2023-05-05 | Discharge: 2023-05-05 | Disposition: A | Discharge status: Home / Self Care | Payer: Medicare Other | Source: Ambulatory Visit | Attending: Radiation Oncology

## 2023-05-05 DIAGNOSIS — Z510 Encounter for antineoplastic radiation therapy: Secondary | ICD-10-CM | POA: Diagnosis not present

## 2023-05-05 DIAGNOSIS — C712 Malignant neoplasm of temporal lobe: Secondary | ICD-10-CM | POA: Diagnosis not present

## 2023-05-05 DIAGNOSIS — C719 Malignant neoplasm of brain, unspecified: Secondary | ICD-10-CM | POA: Diagnosis not present

## 2023-05-05 LAB — RAD ONC ARIA SESSION SUMMARY
Course Elapsed Days: 6
Plan Fractions Treated to Date: 6
Plan Prescribed Dose Per Fraction: 2 Gy
Plan Total Fractions Prescribed: 22
Plan Total Prescribed Dose: 44 Gy
Reference Point Dosage Given to Date: 12 Gy
Reference Point Session Dosage Given: 2 Gy
Session Number: 6

## 2023-05-06 ENCOUNTER — Ambulatory Visit: Payer: Medicare Other

## 2023-05-06 ENCOUNTER — Other Ambulatory Visit: Payer: Self-pay

## 2023-05-06 ENCOUNTER — Ambulatory Visit
Admission: RE | Admit: 2023-05-06 | Discharge: 2023-05-06 | Disposition: A | Discharge status: Home / Self Care | Payer: Medicare Other | Source: Ambulatory Visit | Attending: Radiation Oncology | Admitting: Radiation Oncology

## 2023-05-06 DIAGNOSIS — C719 Malignant neoplasm of brain, unspecified: Secondary | ICD-10-CM | POA: Diagnosis not present

## 2023-05-06 DIAGNOSIS — Z510 Encounter for antineoplastic radiation therapy: Secondary | ICD-10-CM | POA: Diagnosis not present

## 2023-05-06 DIAGNOSIS — C712 Malignant neoplasm of temporal lobe: Secondary | ICD-10-CM | POA: Diagnosis not present

## 2023-05-06 LAB — RAD ONC ARIA SESSION SUMMARY
Course Elapsed Days: 7
Plan Fractions Treated to Date: 7
Plan Prescribed Dose Per Fraction: 2 Gy
Plan Total Fractions Prescribed: 22
Plan Total Prescribed Dose: 44 Gy
Reference Point Dosage Given to Date: 14 Gy
Reference Point Session Dosage Given: 2 Gy
Session Number: 7

## 2023-05-07 ENCOUNTER — Other Ambulatory Visit: Payer: Self-pay

## 2023-05-07 ENCOUNTER — Ambulatory Visit: Payer: Medicare Other

## 2023-05-07 ENCOUNTER — Ambulatory Visit
Admission: RE | Admit: 2023-05-07 | Discharge: 2023-05-07 | Disposition: A | Discharge status: Home / Self Care | Payer: Medicare Other | Source: Ambulatory Visit | Attending: Radiation Oncology | Admitting: Radiation Oncology

## 2023-05-07 ENCOUNTER — Telehealth: Payer: Self-pay | Admitting: *Deleted

## 2023-05-07 DIAGNOSIS — C719 Malignant neoplasm of brain, unspecified: Secondary | ICD-10-CM | POA: Diagnosis not present

## 2023-05-07 DIAGNOSIS — Z510 Encounter for antineoplastic radiation therapy: Secondary | ICD-10-CM | POA: Diagnosis not present

## 2023-05-07 DIAGNOSIS — C712 Malignant neoplasm of temporal lobe: Secondary | ICD-10-CM | POA: Diagnosis not present

## 2023-05-07 LAB — RAD ONC ARIA SESSION SUMMARY
Course Elapsed Days: 8
Plan Fractions Treated to Date: 8
Plan Prescribed Dose Per Fraction: 2 Gy
Plan Total Fractions Prescribed: 22
Plan Total Prescribed Dose: 44 Gy
Reference Point Dosage Given to Date: 16 Gy
Reference Point Session Dosage Given: 2 Gy
Session Number: 8

## 2023-05-07 NOTE — Telephone Encounter (Signed)
PC from patient, states he is almost out of Keppra & is asking if Dr Barbaraann Cao wants him to continue this medication.  Per Dr Liana Gerold advice on 05/02/23, he does not need to continue in absence of seizures.  Patient denies seizures, verbalizes understanding that he will not be continuing on Keppra.

## 2023-05-08 ENCOUNTER — Other Ambulatory Visit: Payer: Self-pay

## 2023-05-08 ENCOUNTER — Ambulatory Visit
Admission: RE | Admit: 2023-05-08 | Discharge: 2023-05-08 | Disposition: A | Discharge status: Home / Self Care | Payer: Medicare Other | Source: Ambulatory Visit | Attending: Radiation Oncology | Admitting: Radiation Oncology

## 2023-05-08 ENCOUNTER — Ambulatory Visit: Payer: Medicare Other

## 2023-05-08 DIAGNOSIS — C719 Malignant neoplasm of brain, unspecified: Secondary | ICD-10-CM | POA: Diagnosis not present

## 2023-05-08 DIAGNOSIS — Z510 Encounter for antineoplastic radiation therapy: Secondary | ICD-10-CM | POA: Diagnosis not present

## 2023-05-08 DIAGNOSIS — C712 Malignant neoplasm of temporal lobe: Secondary | ICD-10-CM | POA: Diagnosis not present

## 2023-05-08 LAB — RAD ONC ARIA SESSION SUMMARY
Course Elapsed Days: 9
Plan Fractions Treated to Date: 9
Plan Prescribed Dose Per Fraction: 2 Gy
Plan Total Fractions Prescribed: 22
Plan Total Prescribed Dose: 44 Gy
Reference Point Dosage Given to Date: 18 Gy
Reference Point Session Dosage Given: 2 Gy
Session Number: 9

## 2023-05-09 ENCOUNTER — Ambulatory Visit: Payer: Medicare Other

## 2023-05-10 ENCOUNTER — Ambulatory Visit: Payer: Medicare Other

## 2023-05-10 ENCOUNTER — Other Ambulatory Visit: Payer: Self-pay

## 2023-05-12 ENCOUNTER — Ambulatory Visit: Payer: Medicare Other

## 2023-05-13 ENCOUNTER — Ambulatory Visit: Payer: Medicare Other

## 2023-05-13 ENCOUNTER — Inpatient Hospital Stay (HOSPITAL_BASED_OUTPATIENT_CLINIC_OR_DEPARTMENT_OTHER): Payer: Medicare Other | Admitting: Internal Medicine

## 2023-05-13 ENCOUNTER — Other Ambulatory Visit: Payer: Self-pay

## 2023-05-13 ENCOUNTER — Ambulatory Visit
Admission: RE | Admit: 2023-05-13 | Discharge: 2023-05-13 | Disposition: A | Discharge status: Home / Self Care | Payer: Medicare Other | Source: Ambulatory Visit | Attending: Radiation Oncology | Admitting: Radiation Oncology

## 2023-05-13 ENCOUNTER — Inpatient Hospital Stay: Payer: Medicare Other | Attending: Internal Medicine

## 2023-05-13 VITALS — BP 137/82 | HR 80 | Temp 97.9°F | Resp 16 | Ht 65.0 in | Wt 179.8 lb

## 2023-05-13 DIAGNOSIS — Z87891 Personal history of nicotine dependence: Secondary | ICD-10-CM | POA: Diagnosis not present

## 2023-05-13 DIAGNOSIS — C719 Malignant neoplasm of brain, unspecified: Secondary | ICD-10-CM

## 2023-05-13 DIAGNOSIS — Z510 Encounter for antineoplastic radiation therapy: Secondary | ICD-10-CM | POA: Diagnosis not present

## 2023-05-13 DIAGNOSIS — Z923 Personal history of irradiation: Secondary | ICD-10-CM | POA: Insufficient documentation

## 2023-05-13 DIAGNOSIS — C712 Malignant neoplasm of temporal lobe: Secondary | ICD-10-CM | POA: Diagnosis present

## 2023-05-13 LAB — CBC WITH DIFFERENTIAL (CANCER CENTER ONLY)
Abs Immature Granulocytes: 0.02 10*3/uL (ref 0.00–0.07)
Basophils Absolute: 0.1 10*3/uL (ref 0.0–0.1)
Basophils Relative: 1 %
Eosinophils Absolute: 0.2 10*3/uL (ref 0.0–0.5)
Eosinophils Relative: 2 %
HCT: 44.4 % (ref 39.0–52.0)
Hemoglobin: 14.8 g/dL (ref 13.0–17.0)
Immature Granulocytes: 0 %
Lymphocytes Relative: 29 %
Lymphs Abs: 2.1 10*3/uL (ref 0.7–4.0)
MCH: 29.3 pg (ref 26.0–34.0)
MCHC: 33.3 g/dL (ref 30.0–36.0)
MCV: 87.9 fL (ref 80.0–100.0)
Monocytes Absolute: 0.7 10*3/uL (ref 0.1–1.0)
Monocytes Relative: 10 %
Neutro Abs: 4.1 10*3/uL (ref 1.7–7.7)
Neutrophils Relative %: 58 %
Platelet Count: 310 10*3/uL (ref 150–400)
RBC: 5.05 MIL/uL (ref 4.22–5.81)
RDW: 13.4 % (ref 11.5–15.5)
WBC Count: 7.2 10*3/uL (ref 4.0–10.5)
nRBC: 0 % (ref 0.0–0.2)

## 2023-05-13 LAB — CMP (CANCER CENTER ONLY)
ALT: 37 U/L (ref 0–44)
AST: 24 U/L (ref 15–41)
Albumin: 4.4 g/dL (ref 3.5–5.0)
Alkaline Phosphatase: 47 U/L (ref 38–126)
Anion gap: 7 (ref 5–15)
BUN: 23 mg/dL (ref 8–23)
CO2: 27 mmol/L (ref 22–32)
Calcium: 9.8 mg/dL (ref 8.9–10.3)
Chloride: 106 mmol/L (ref 98–111)
Creatinine: 1.57 mg/dL — ABNORMAL HIGH (ref 0.61–1.24)
GFR, Estimated: 47 mL/min — ABNORMAL LOW (ref >60)
Glucose, Bld: 85 mg/dL (ref 70–99)
Potassium: 4.1 mmol/L (ref 3.5–5.1)
Sodium: 140 mmol/L (ref 135–145)
Total Bilirubin: 0.5 mg/dL (ref <1.2)
Total Protein: 7 g/dL (ref 6.5–8.1)

## 2023-05-13 LAB — RAD ONC ARIA SESSION SUMMARY
Course Elapsed Days: 14
Plan Fractions Treated to Date: 10
Plan Prescribed Dose Per Fraction: 2 Gy
Plan Total Fractions Prescribed: 22
Plan Total Prescribed Dose: 44 Gy
Reference Point Dosage Given to Date: 20 Gy
Reference Point Session Dosage Given: 2 Gy
Session Number: 10

## 2023-05-13 NOTE — Progress Notes (Signed)
[facility] at [facility] [address]  [place], [place] [address] [phone]   Interval Evaluation  Date of Service: 05/13/23 Patient Name: Juan Garrett Patient MRN: [id number] Patient DOB: 11-Jan-1951 Provider: Henreitta Leber, MD  Identifying Statement:  Juan Garrett is a 72 y.o. male with right temporal glioblastoma   Oncologic History: Oncology History  Glioblastoma, IDH-wildtype (HCC)  01/31/2023 Surgery   Craniotomy, resection of right temporal mass with Dr. Lovell Sheehan; pathology demonstrates Glioblastoma, IDH-wt   04/05/2023 Surgery   Repeat right temporal resection with Dr. Zachery Conch at [facility]   04/29/2023 -  Radiation Therapy   IMRT with concurrent Temodar 75mg /m2     Biomarkers:  MGMT Unknown.  IDH 1/2 Wild type.  EGFR Unknown  TERT Unknown   Interval History: Juan Garrett presents today for follow up, now having completed 2 weeks of IMRT and Temodar.  He is feeling well, no new or progressive neurologic deficits.  Only modest fatigue.  Denies headaches, seizures.  Constipation has been a bit of an issue with the Temodar.   H+P (02/28/23) Patient presented to neurologic attention in August 2024 with several weeks of progressive confusion, disorientation, and several falls.  CNS imaging demonstrated large cystic mass in right temporal lobe.  He underwent craniotomy, resection with Dr. Lovell Sheehan on 01/31/23 without complication, pathology demonstrated glioblastoma.  Since surgery, he has been at home, doing well.  No issues with gait, no recurrence of confusion, visual spatial issues.  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  Medications: Current Outpatient Medications on File Prior to Visit  Medication Sig Dispense Refill   amLODipine-benazepril (LOTREL) 5-10 MG capsule Take 1 capsule by mouth daily.     atorvastatin (LIPITOR) 10 MG tablet Take 10 mg by mouth daily.     famotidine (PEPCID) 40 MG tablet Take 40 mg by  mouth daily.     Fexofenadine HCl (ALLEGRA PO) Take by mouth daily.     fluticasone (FLONASE) 50 MCG/ACT nasal spray Place 1 spray into both nostrils daily.     Fluticasone Furoate (ARNUITY ELLIPTA) 50 MCG/ACT AEPB      HYDROcodone-acetaminophen (NORCO/VICODIN) 5-325 MG tablet Take 1 tablet by mouth every 4 (four) hours as needed for moderate pain. 30 tablet 0   ibuprofen (ADVIL) 200 MG tablet Take 200 mg by mouth daily as needed for mild pain.     ondansetron (ZOFRAN) 4 MG tablet Take 1 tablet (4 mg total) by mouth every 8 (eight) hours as needed for nausea. 30 tablet 1   ondansetron (ZOFRAN) 8 MG tablet Take 1 tablet (8 mg total) by mouth every 8 (eight) hours as needed for nausea or vomiting. May take 30-60 minutes prior to Temodar administration if nausea/vomiting occurs as needed. 30 tablet 1   temozolomide (TEMODAR) 140 MG capsule Take 1 capsule (140 mg total) by mouth daily. May take on an empty stomach to decrease nausea & vomiting. 42 capsule 0   Vitamin D, Ergocalciferol, (DRISDOL) 1.25 MG (50000 UNIT) CAPS capsule Take 50,000 Units by mouth every 14 (fourteen) days.     No current facility-administered medications on file prior to visit.    Allergies:  Allergies  Allergen Reactions   Aspirin Other (See Comments)    Upset stomach   Past Medical History:  Past Medical History:  Diagnosis Date   Allergy    seasonal allergies   Arthritis    bilateral knees   Cataract    not a surgical candidate  at this time (07/06/2020)   GERD (gastroesophageal reflux disease)    on meds   Hypertension    on meds   Past Surgical History:  Past Surgical History:  Procedure Laterality Date   APPLICATION OF CRANIAL NAVIGATION N/A 01/31/2023   Procedure: APPLICATION OF CRANIAL NAVIGATION;  Surgeon: Tressie Stalker, MD;  Location: [facility] OR;  Service: Neurosurgery;  Laterality: N/A;   CARPAL TUNNEL RELEASE Bilateral 1997   CHONDROPLASTY Right 09/23/2019   Procedure: CHONDROPLASTY;  Surgeon: Tarry Kos, MD;  Location: [facility];  Service: Orthopedics;  Laterality: Right;   CRANIOTOMY Right 01/31/2023   Procedure: CRANIOTOMY TUMOR EXCISION;  Surgeon: Tressie Stalker, MD;  Location: [facility] OR;  Service: Neurosurgery;  Laterality: Right;   KNEE ARTHROSCOPY WITH MEDIAL MENISECTOMY Right 09/23/2019   Procedure: RIGHT KNEE ARTHROSCOPY WITH PARTIAL MEDIAL MENISCECTOMY;  Surgeon: Tarry Kos, MD;  Location: [facility];  Service: Orthopedics;  Laterality: Right;   KNEE SURGERY Left 1992   Social History:  Social History   Socioeconomic History   Marital status: Married    Spouse name: Not on file   Number of children: Not on file   Years of education: Not on file   Highest education level: Not on file  Occupational History   Not on file  Tobacco Use   Smoking status: Former    Types: Cigarettes   Smokeless tobacco: Never  Vaping Use   Vaping status: Never Used  Substance and Sexual Activity   Alcohol use: Not Currently   Drug use: Never   Sexual activity: Not on file  Other Topics Concern   Not on file  Social History Narrative   Not on file   Social Determinants of Health   Financial Resource Strain: Not on file  Food Insecurity: No Food Insecurity (03/04/2023)   Hunger Vital Sign    Worried About Running Out of Food in the Last Year: Never true    Ran Out of Food in the Last Year: Never true  Transportation Needs: No Transportation Needs (04/05/2023)   Received from [facility] - Transportation    In the past 12 months, has lack of transportation kept you from medical appointments or from getting medications?: No    Lack of Transportation (Non-Medical): No  Physical Activity: Not on file  Stress: Not on file  Social Connections: Not on file  Intimate Partner Violence: Not At Risk (03/04/2023)   Humiliation, Afraid, Rape, and Kick questionnaire    Fear of Current or Ex-Partner: No    Emotionally Abused: No     Physically Abused: No    Sexually Abused: No   Family History:  Family History  Problem Relation Age of Onset   Colon polyps Neg Hx    Colon cancer Neg Hx    Esophageal cancer Neg Hx    Stomach cancer Neg Hx    Rectal cancer Neg Hx     Review of Systems: Constitutional: Doesn't report fevers, chills or abnormal weight loss Eyes: Doesn't report blurriness of vision Ears, nose, mouth, throat, and face: Doesn't report sore throat Respiratory: Doesn't report cough, dyspnea or wheezes Cardiovascular: Doesn't report palpitation, chest discomfort  Gastrointestinal:  Doesn't report nausea, constipation, diarrhea GU: Doesn't report incontinence Skin: Doesn't report skin rashes Neurological: Per HPI Musculoskeletal: Doesn't report joint pain Behavioral/Psych: Doesn't report anxiety  Physical Exam: Vitals:   05/13/23 1501  BP: 137/82  Pulse: 80  Resp: 16  Temp: 97.9  F (36.6 C)  SpO2: 96%    KPS: 90. General: Alert, cooperative, pleasant, in no acute distress Head: Normal EENT: No conjunctival injection or scleral icterus.  Lungs: Resp effort normal Cardiac: Regular rate Abdomen: Non-distended abdomen Skin: No rashes cyanosis or petechiae. Extremities: No clubbing or edema  Neurologic Exam: Mental Status: Awake, alert, attentive to examiner. Oriented to self and environment. Language is fluent with intact comprehension.  Cranial Nerves: Visual acuity is grossly normal. Visual fields are full. Extra-ocular movements intact. No ptosis. Face is symmetric Motor: Tone and bulk are normal. Power is full in both arms and legs. Reflexes are symmetric, no pathologic reflexes present.  Sensory: Intact to light touch Gait: Normal.   Labs: I have reviewed the data as listed    Component Value Date/Time   NA 140 05/13/2023 1346   K 4.1 05/13/2023 1346   CL 106 05/13/2023 1346   CO2 27 05/13/2023 1346   GLUCOSE 85 05/13/2023 1346   BUN 23 05/13/2023 1346   CREATININE 1.57 (H)  05/13/2023 1346   CALCIUM 9.8 05/13/2023 1346   PROT 7.0 05/13/2023 1346   ALBUMIN 4.4 05/13/2023 1346   AST 24 05/13/2023 1346   ALT 37 05/13/2023 1346   ALKPHOS 47 05/13/2023 1346   BILITOT 0.5 05/13/2023 1346   GFRNONAA 47 (L) 05/13/2023 1346   Lab Results  Component Value Date   WBC 7.2 05/13/2023   NEUTROABS 4.1 05/13/2023   HGB 14.8 05/13/2023   HCT 44.4 05/13/2023   MCV 87.9 05/13/2023   PLT 310 05/13/2023     Assessment/Plan Glioblastoma, IDH-wildtype (HCC)  Josimar Steinberger is clinically stable today, now having completed 2 weeks of IMRT and Temodar.  We ultimately recommended continuing with course of intensity modulated radiation therapy and concurrent daily Temozolomide.  Radiation will be administered Mon-Fri over 6 weeks, Temodar will be dosed at 75mg /m2 to be given daily over 42 days.  We reviewed side effects of temodar, including fatigue, nausea/vomiting, constipation, and cytopenias.  Informed consent was verbally obtained at bedside to proceed with oral chemotherapy.  Chemotherapy should be held for the following:  ANC less than 1,000  Platelets less than 100,000  LFT or creatinine greater than 2x ULN  If clinical concerns/contraindications develop  Every 2 weeks during radiation, labs will be checked accompanied by a clinical evaluation in the brain tumor clinic.  Ok with adding daily Senna to his bowel regimen, already dosing Colace.   All questions were answered. The patient knows to call the clinic with any problems, questions or concerns. No barriers to learning were detected.  The total time spent in the encounter was 30 minutes and more than 50% was on counseling and review of test results   Henreitta Leber, MD Medical Director of Neuro-Oncology [facility] at [facility] 05/13/23 3:07 PM

## 2023-05-14 ENCOUNTER — Ambulatory Visit
Admission: RE | Admit: 2023-05-14 | Discharge: 2023-05-14 | Disposition: A | Discharge status: Home / Self Care | Payer: Medicare Other | Source: Ambulatory Visit | Attending: Radiation Oncology

## 2023-05-14 ENCOUNTER — Ambulatory Visit: Payer: Medicare Other

## 2023-05-14 ENCOUNTER — Other Ambulatory Visit: Payer: Self-pay

## 2023-05-14 DIAGNOSIS — C719 Malignant neoplasm of brain, unspecified: Secondary | ICD-10-CM | POA: Diagnosis not present

## 2023-05-14 DIAGNOSIS — Z510 Encounter for antineoplastic radiation therapy: Secondary | ICD-10-CM | POA: Diagnosis not present

## 2023-05-14 LAB — RAD ONC ARIA SESSION SUMMARY
Course Elapsed Days: 15
Plan Fractions Treated to Date: 11
Plan Prescribed Dose Per Fraction: 2 Gy
Plan Total Fractions Prescribed: 22
Plan Total Prescribed Dose: 44 Gy
Reference Point Dosage Given to Date: 22 Gy
Reference Point Session Dosage Given: 2 Gy
Session Number: 11

## 2023-05-15 ENCOUNTER — Ambulatory Visit
Admission: RE | Admit: 2023-05-15 | Discharge: 2023-05-15 | Disposition: A | Discharge status: Home / Self Care | Payer: Medicare Other | Source: Ambulatory Visit | Attending: Radiation Oncology | Admitting: Radiation Oncology

## 2023-05-15 ENCOUNTER — Ambulatory Visit: Payer: Medicare Other

## 2023-05-15 ENCOUNTER — Other Ambulatory Visit: Payer: Self-pay

## 2023-05-15 DIAGNOSIS — Z510 Encounter for antineoplastic radiation therapy: Secondary | ICD-10-CM | POA: Diagnosis not present

## 2023-05-15 DIAGNOSIS — C719 Malignant neoplasm of brain, unspecified: Secondary | ICD-10-CM | POA: Diagnosis not present

## 2023-05-15 LAB — RAD ONC ARIA SESSION SUMMARY
Course Elapsed Days: 16
Plan Fractions Treated to Date: 12
Plan Prescribed Dose Per Fraction: 2 Gy
Plan Total Fractions Prescribed: 22
Plan Total Prescribed Dose: 44 Gy
Reference Point Dosage Given to Date: 24 Gy
Reference Point Session Dosage Given: 2 Gy
Session Number: 12

## 2023-05-16 ENCOUNTER — Ambulatory Visit: Payer: Medicare Other

## 2023-05-16 ENCOUNTER — Other Ambulatory Visit: Payer: Self-pay

## 2023-05-16 ENCOUNTER — Ambulatory Visit
Admission: RE | Admit: 2023-05-16 | Discharge: 2023-05-16 | Disposition: A | Discharge status: Home / Self Care | Payer: Medicare Other | Source: Ambulatory Visit | Attending: Radiation Oncology | Admitting: Radiation Oncology

## 2023-05-16 DIAGNOSIS — C719 Malignant neoplasm of brain, unspecified: Secondary | ICD-10-CM | POA: Diagnosis not present

## 2023-05-16 DIAGNOSIS — Z510 Encounter for antineoplastic radiation therapy: Secondary | ICD-10-CM | POA: Diagnosis not present

## 2023-05-16 LAB — RAD ONC ARIA SESSION SUMMARY
Course Elapsed Days: 17
Plan Fractions Treated to Date: 13
Plan Prescribed Dose Per Fraction: 2 Gy
Plan Total Fractions Prescribed: 22
Plan Total Prescribed Dose: 44 Gy
Reference Point Dosage Given to Date: 26 Gy
Reference Point Session Dosage Given: 2 Gy
Session Number: 13

## 2023-05-17 ENCOUNTER — Ambulatory Visit: Payer: Medicare Other

## 2023-05-17 ENCOUNTER — Other Ambulatory Visit: Payer: Self-pay

## 2023-05-17 ENCOUNTER — Ambulatory Visit
Admission: RE | Admit: 2023-05-17 | Discharge: 2023-05-17 | Disposition: A | Discharge status: Home / Self Care | Payer: Medicare Other | Source: Ambulatory Visit | Attending: Radiation Oncology | Admitting: Radiation Oncology

## 2023-05-17 DIAGNOSIS — C719 Malignant neoplasm of brain, unspecified: Secondary | ICD-10-CM | POA: Diagnosis not present

## 2023-05-17 DIAGNOSIS — Z510 Encounter for antineoplastic radiation therapy: Secondary | ICD-10-CM | POA: Diagnosis not present

## 2023-05-17 LAB — RAD ONC ARIA SESSION SUMMARY
Course Elapsed Days: 18
Plan Fractions Treated to Date: 14
Plan Prescribed Dose Per Fraction: 2 Gy
Plan Total Fractions Prescribed: 22
Plan Total Prescribed Dose: 44 Gy
Reference Point Dosage Given to Date: 28 Gy
Reference Point Session Dosage Given: 2 Gy
Session Number: 14

## 2023-05-20 ENCOUNTER — Ambulatory Visit: Payer: Medicare Other

## 2023-05-20 ENCOUNTER — Ambulatory Visit
Admission: RE | Admit: 2023-05-20 | Discharge: 2023-05-20 | Disposition: A | Discharge status: Home / Self Care | Payer: Medicare Other | Source: Ambulatory Visit | Attending: Radiation Oncology

## 2023-05-20 ENCOUNTER — Other Ambulatory Visit: Payer: Self-pay

## 2023-05-20 DIAGNOSIS — C719 Malignant neoplasm of brain, unspecified: Secondary | ICD-10-CM | POA: Diagnosis not present

## 2023-05-20 DIAGNOSIS — Z510 Encounter for antineoplastic radiation therapy: Secondary | ICD-10-CM | POA: Diagnosis not present

## 2023-05-20 LAB — RAD ONC ARIA SESSION SUMMARY
Course Elapsed Days: 21
Plan Fractions Treated to Date: 15
Plan Prescribed Dose Per Fraction: 2 Gy
Plan Total Fractions Prescribed: 22
Plan Total Prescribed Dose: 44 Gy
Reference Point Dosage Given to Date: 30 Gy
Reference Point Session Dosage Given: 2 Gy
Session Number: 15

## 2023-05-21 ENCOUNTER — Ambulatory Visit
Admission: RE | Admit: 2023-05-21 | Discharge: 2023-05-21 | Disposition: A | Discharge status: Home / Self Care | Payer: Medicare Other | Source: Ambulatory Visit | Attending: Radiation Oncology | Admitting: Radiation Oncology

## 2023-05-21 ENCOUNTER — Other Ambulatory Visit: Payer: Self-pay

## 2023-05-21 DIAGNOSIS — Z510 Encounter for antineoplastic radiation therapy: Secondary | ICD-10-CM | POA: Diagnosis not present

## 2023-05-21 DIAGNOSIS — C719 Malignant neoplasm of brain, unspecified: Secondary | ICD-10-CM | POA: Diagnosis not present

## 2023-05-21 LAB — RAD ONC ARIA SESSION SUMMARY
Course Elapsed Days: 22
Plan Fractions Treated to Date: 16
Plan Prescribed Dose Per Fraction: 2 Gy
Plan Total Fractions Prescribed: 22
Plan Total Prescribed Dose: 44 Gy
Reference Point Dosage Given to Date: 32 Gy
Reference Point Session Dosage Given: 2 Gy
Session Number: 16

## 2023-05-22 ENCOUNTER — Ambulatory Visit: Payer: Medicare Other

## 2023-05-22 ENCOUNTER — Ambulatory Visit
Admission: RE | Admit: 2023-05-22 | Discharge: 2023-05-22 | Disposition: A | Discharge status: Home / Self Care | Payer: Medicare Other | Source: Ambulatory Visit | Attending: Radiation Oncology | Admitting: Radiation Oncology

## 2023-05-22 ENCOUNTER — Other Ambulatory Visit: Payer: Self-pay

## 2023-05-22 DIAGNOSIS — Z510 Encounter for antineoplastic radiation therapy: Secondary | ICD-10-CM | POA: Diagnosis not present

## 2023-05-22 DIAGNOSIS — C719 Malignant neoplasm of brain, unspecified: Secondary | ICD-10-CM | POA: Diagnosis not present

## 2023-05-22 LAB — RAD ONC ARIA SESSION SUMMARY
Course Elapsed Days: 23
Plan Fractions Treated to Date: 17
Plan Prescribed Dose Per Fraction: 2 Gy
Plan Total Fractions Prescribed: 22
Plan Total Prescribed Dose: 44 Gy
Reference Point Dosage Given to Date: 34 Gy
Reference Point Session Dosage Given: 2 Gy
Session Number: 17

## 2023-05-23 ENCOUNTER — Ambulatory Visit: Payer: Medicare Other

## 2023-05-23 ENCOUNTER — Other Ambulatory Visit: Payer: Self-pay

## 2023-05-23 ENCOUNTER — Ambulatory Visit
Admission: RE | Admit: 2023-05-23 | Discharge: 2023-05-23 | Disposition: A | Discharge status: Home / Self Care | Payer: Medicare Other | Source: Ambulatory Visit | Attending: Radiation Oncology | Admitting: Radiation Oncology

## 2023-05-23 DIAGNOSIS — Z510 Encounter for antineoplastic radiation therapy: Secondary | ICD-10-CM | POA: Diagnosis not present

## 2023-05-23 DIAGNOSIS — C719 Malignant neoplasm of brain, unspecified: Secondary | ICD-10-CM | POA: Diagnosis not present

## 2023-05-23 LAB — RAD ONC ARIA SESSION SUMMARY
Course Elapsed Days: 24
Plan Fractions Treated to Date: 18
Plan Prescribed Dose Per Fraction: 2 Gy
Plan Total Fractions Prescribed: 22
Plan Total Prescribed Dose: 44 Gy
Reference Point Dosage Given to Date: 36 Gy
Reference Point Session Dosage Given: 2 Gy
Session Number: 18

## 2023-05-24 ENCOUNTER — Ambulatory Visit: Payer: Medicare Other

## 2023-05-24 ENCOUNTER — Other Ambulatory Visit: Payer: Self-pay

## 2023-05-24 ENCOUNTER — Ambulatory Visit
Admission: RE | Admit: 2023-05-24 | Discharge: 2023-05-24 | Disposition: A | Discharge status: Home / Self Care | Payer: Medicare Other | Source: Ambulatory Visit | Attending: Radiation Oncology | Admitting: Radiation Oncology

## 2023-05-24 DIAGNOSIS — C719 Malignant neoplasm of brain, unspecified: Secondary | ICD-10-CM | POA: Diagnosis not present

## 2023-05-24 DIAGNOSIS — Z510 Encounter for antineoplastic radiation therapy: Secondary | ICD-10-CM | POA: Diagnosis not present

## 2023-05-24 LAB — RAD ONC ARIA SESSION SUMMARY
Course Elapsed Days: 25
Plan Fractions Treated to Date: 19
Plan Prescribed Dose Per Fraction: 2 Gy
Plan Total Fractions Prescribed: 22
Plan Total Prescribed Dose: 44 Gy
Reference Point Dosage Given to Date: 38 Gy
Reference Point Session Dosage Given: 2 Gy
Session Number: 19

## 2023-05-27 ENCOUNTER — Inpatient Hospital Stay: Payer: Medicare Other | Admitting: Internal Medicine

## 2023-05-27 ENCOUNTER — Inpatient Hospital Stay: Payer: Medicare Other

## 2023-05-27 ENCOUNTER — Other Ambulatory Visit: Payer: Self-pay

## 2023-05-27 ENCOUNTER — Ambulatory Visit
Admission: RE | Admit: 2023-05-27 | Discharge: 2023-05-27 | Disposition: A | Discharge status: Home / Self Care | Payer: Medicare Other | Source: Ambulatory Visit | Attending: Radiation Oncology | Admitting: Radiation Oncology

## 2023-05-27 ENCOUNTER — Other Ambulatory Visit (HOSPITAL_COMMUNITY): Payer: Self-pay

## 2023-05-27 ENCOUNTER — Ambulatory Visit: Payer: Medicare Other

## 2023-05-27 VITALS — BP 127/76 | HR 76 | Temp 98.1°F | Resp 20 | Wt 176.1 lb

## 2023-05-27 DIAGNOSIS — C719 Malignant neoplasm of brain, unspecified: Secondary | ICD-10-CM | POA: Diagnosis not present

## 2023-05-27 DIAGNOSIS — C712 Malignant neoplasm of temporal lobe: Secondary | ICD-10-CM | POA: Diagnosis not present

## 2023-05-27 DIAGNOSIS — Z510 Encounter for antineoplastic radiation therapy: Secondary | ICD-10-CM | POA: Diagnosis not present

## 2023-05-27 LAB — CMP (CANCER CENTER ONLY)
ALT: 23 U/L (ref 0–44)
AST: 18 U/L (ref 15–41)
Albumin: 4.5 g/dL (ref 3.5–5.0)
Alkaline Phosphatase: 45 U/L (ref 38–126)
Anion gap: 7 (ref 5–15)
BUN: 24 mg/dL — ABNORMAL HIGH (ref 8–23)
CO2: 27 mmol/L (ref 22–32)
Calcium: 9.5 mg/dL (ref 8.9–10.3)
Chloride: 106 mmol/L (ref 98–111)
Creatinine: 1.27 mg/dL — ABNORMAL HIGH (ref 0.61–1.24)
GFR, Estimated: 60 mL/min — ABNORMAL LOW (ref >60)
Glucose, Bld: 99 mg/dL (ref 70–99)
Potassium: 4.3 mmol/L (ref 3.5–5.1)
Sodium: 140 mmol/L (ref 135–145)
Total Bilirubin: 0.7 mg/dL (ref <1.2)
Total Protein: 7.1 g/dL (ref 6.5–8.1)

## 2023-05-27 LAB — RAD ONC ARIA SESSION SUMMARY
Course Elapsed Days: 28
Plan Fractions Treated to Date: 20
Plan Prescribed Dose Per Fraction: 2 Gy
Plan Total Fractions Prescribed: 22
Plan Total Prescribed Dose: 44 Gy
Reference Point Dosage Given to Date: 40 Gy
Reference Point Session Dosage Given: 2 Gy
Session Number: 20

## 2023-05-27 LAB — CBC WITH DIFFERENTIAL (CANCER CENTER ONLY)
Abs Immature Granulocytes: 0.03 10*3/uL (ref 0.00–0.07)
Basophils Absolute: 0.1 10*3/uL (ref 0.0–0.1)
Basophils Relative: 1 %
Eosinophils Absolute: 0.2 10*3/uL (ref 0.0–0.5)
Eosinophils Relative: 2 %
HCT: 46.2 % (ref 39.0–52.0)
Hemoglobin: 15.2 g/dL (ref 13.0–17.0)
Immature Granulocytes: 0 %
Lymphocytes Relative: 13 %
Lymphs Abs: 1.3 10*3/uL (ref 0.7–4.0)
MCH: 28.9 pg (ref 26.0–34.0)
MCHC: 32.9 g/dL (ref 30.0–36.0)
MCV: 87.8 fL (ref 80.0–100.0)
Monocytes Absolute: 0.8 10*3/uL (ref 0.1–1.0)
Monocytes Relative: 8 %
Neutro Abs: 7.4 10*3/uL (ref 1.7–7.7)
Neutrophils Relative %: 76 %
Platelet Count: 243 10*3/uL (ref 150–400)
RBC: 5.26 MIL/uL (ref 4.22–5.81)
RDW: 13.3 % (ref 11.5–15.5)
WBC Count: 9.8 10*3/uL (ref 4.0–10.5)
nRBC: 0 % (ref 0.0–0.2)

## 2023-05-27 NOTE — Progress Notes (Signed)
[facility] [address]  [place], [place] [address] [phone]   Interval Evaluation  Date of Service: 05/27/23 Patient Name: Juan Garrett Patient MRN: [id number] Patient DOB: 1951/02/11 Provider: Henreitta Leber, MD  Identifying Statement:  Juan Garrett is a 72 y.o. male with right temporal glioblastoma   Oncologic History: Oncology History  Glioblastoma, IDH-wildtype (HCC)  01/31/2023 Surgery   Craniotomy, resection of right temporal mass with Dr. Lovell Sheehan; pathology demonstrates Glioblastoma, IDH-wt   04/05/2023 Surgery   Repeat right temporal resection with Dr. Zachery Conch at [facility]   04/29/2023 -  Radiation Therapy   IMRT with concurrent Temodar 75mg /m2     Biomarkers:  MGMT Unknown.  IDH 1/2 Wild type.  EGFR Unknown  TERT Unknown   Interval History: Juan Garrett presents today for follow up, now having completed 4 weeks of IMRT and Temodar.  He is feeling well, no new or progressive neurologic deficits.  Only modest fatigue.  Has "crusting" in right ear each morning.  Denies headaches, seizures.  Constipation still an issue, mildly improved with Senna tablets.   H+P (02/28/23) Patient presented to neurologic attention in August 2024 with several weeks of progressive confusion, disorientation, and several falls.  CNS imaging demonstrated large cystic mass in right temporal lobe.  He underwent craniotomy, resection with Dr. Lovell Sheehan on 01/31/23 without complication, pathology demonstrated glioblastoma.  Since surgery, he has been at home, doing well.  No issues with gait, no recurrence of confusion, visual spatial issues.  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  Medications: Current Outpatient Medications on File Prior to Visit  Medication Sig Dispense Refill   amLODipine-benazepril (LOTREL) 5-10 MG capsule Take 1 capsule by mouth daily.     atorvastatin (LIPITOR) 10 MG tablet Take 10 mg by mouth daily.      famotidine (PEPCID) 40 MG tablet Take 40 mg by mouth daily.     Fexofenadine HCl (ALLEGRA PO) Take by mouth daily.     fluticasone (FLONASE) 50 MCG/ACT nasal spray Place 1 spray into both nostrils daily.     Fluticasone Furoate (ARNUITY ELLIPTA) 50 MCG/ACT AEPB      HYDROcodone-acetaminophen (NORCO/VICODIN) 5-325 MG tablet Take 1 tablet by mouth every 4 (four) hours as needed for moderate pain. 30 tablet 0   ibuprofen (ADVIL) 200 MG tablet Take 200 mg by mouth daily as needed for mild pain.     ondansetron (ZOFRAN) 4 MG tablet Take 1 tablet (4 mg total) by mouth every 8 (eight) hours as needed for nausea. 30 tablet 1   ondansetron (ZOFRAN) 8 MG tablet Take 1 tablet (8 mg total) by mouth every 8 (eight) hours as needed for nausea or vomiting. May take 30-60 minutes prior to Temodar administration if nausea/vomiting occurs as needed. 30 tablet 1   temozolomide (TEMODAR) 140 MG capsule Take 1 capsule (140 mg total) by mouth daily. May take on an empty stomach to decrease nausea & vomiting. 42 capsule 0   Vitamin D, Ergocalciferol, (DRISDOL) 1.25 MG (50000 UNIT) CAPS capsule Take 50,000 Units by mouth every 14 (fourteen) days.     No current facility-administered medications on file prior to visit.    Allergies:  Allergies  Allergen Reactions   Aspirin Other (See Comments)    Upset stomach   Past Medical History:  Past Medical History:  Diagnosis Date   Allergy    seasonal allergies   Arthritis    bilateral knees   Cataract  not a surgical candidate at this time (07/06/2020)   GERD (gastroesophageal reflux disease)    on meds   Hypertension    on meds   Past Surgical History:  Past Surgical History:  Procedure Laterality Date   APPLICATION OF CRANIAL NAVIGATION N/A 01/31/2023   Procedure: APPLICATION OF CRANIAL NAVIGATION;  Surgeon: Tressie Stalker, MD;  Location: [facility] OR;  Service: Neurosurgery;  Laterality: N/A;   CARPAL TUNNEL RELEASE Bilateral 1997   CHONDROPLASTY Right  09/23/2019   Procedure: CHONDROPLASTY;  Surgeon: Tarry Kos, MD;  Location: [facility];  Service: Orthopedics;  Laterality: Right;   CRANIOTOMY Right 01/31/2023   Procedure: CRANIOTOMY TUMOR EXCISION;  Surgeon: Tressie Stalker, MD;  Location: [facility] OR;  Service: Neurosurgery;  Laterality: Right;   KNEE ARTHROSCOPY WITH MEDIAL MENISECTOMY Right 09/23/2019   Procedure: RIGHT KNEE ARTHROSCOPY WITH PARTIAL MEDIAL MENISCECTOMY;  Surgeon: Tarry Kos, MD;  Location: [facility];  Service: Orthopedics;  Laterality: Right;   KNEE SURGERY Left 1992   Social History:  Social History   Socioeconomic History   Marital status: Married    Spouse name: Not on file   Number of children: Not on file   Years of education: Not on file   Highest education level: Not on file  Occupational History   Not on file  Tobacco Use   Smoking status: Former    Types: Cigarettes   Smokeless tobacco: Never  Vaping Use   Vaping status: Never Used  Substance and Sexual Activity   Alcohol use: Not Currently   Drug use: Never   Sexual activity: Not on file  Other Topics Concern   Not on file  Social History Narrative   Not on file   Social Drivers of Health   Financial Resource Strain: Not on file  Food Insecurity: No Food Insecurity (03/04/2023)   Hunger Vital Sign    Worried About Running Out of Food in the Last Year: Never true    Ran Out of Food in the Last Year: Never true  Transportation Needs: No Transportation Needs (04/05/2023)   Received from [facility] - Transportation    In the past 12 months, has lack of transportation kept you from medical appointments or from getting medications?: No    Lack of Transportation (Non-Medical): No  Physical Activity: Not on file  Stress: Not on file  Social Connections: Not on file  Intimate Partner Violence: Not At Risk (03/04/2023)   Humiliation, Afraid, Rape, and Kick questionnaire    Fear of Current  or Ex-Partner: No    Emotionally Abused: No    Physically Abused: No    Sexually Abused: No   Family History:  Family History  Problem Relation Age of Onset   Colon polyps Neg Hx    Colon cancer Neg Hx    Esophageal cancer Neg Hx    Stomach cancer Neg Hx    Rectal cancer Neg Hx     Review of Systems: Constitutional: Doesn't report fevers, chills or abnormal weight loss Eyes: Doesn't report blurriness of vision Ears, nose, mouth, throat, and face: Doesn't report sore throat Respiratory: Doesn't report cough, dyspnea or wheezes Cardiovascular: Doesn't report palpitation, chest discomfort  Gastrointestinal:  Doesn't report nausea, constipation, diarrhea GU: Doesn't report incontinence Skin: Doesn't report skin rashes Neurological: Per HPI Musculoskeletal: Doesn't report joint pain Behavioral/Psych: Doesn't report anxiety  Physical Exam: Vitals:   05/27/23 1421  BP: 127/76  Pulse: 76  Resp:  20  Temp: 98.1 F (36.7 C)  SpO2: 98%    KPS: 90. General: Alert, cooperative, pleasant, in no acute distress Head: Normal EENT: No conjunctival injection or scleral icterus.  Lungs: Resp effort normal Cardiac: Regular rate Abdomen: Non-distended abdomen Skin: No rashes cyanosis or petechiae. Extremities: No clubbing or edema  Neurologic Exam: Mental Status: Awake, alert, attentive to examiner. Oriented to self and environment. Language is fluent with intact comprehension.  Cranial Nerves: Visual acuity is grossly normal. Visual fields are full. Extra-ocular movements intact. No ptosis. Face is symmetric Motor: Tone and bulk are normal. Power is full in both arms and legs. Reflexes are symmetric, no pathologic reflexes present.  Sensory: Intact to light touch Gait: Normal.   Labs: I have reviewed the data as listed    Component Value Date/Time   NA 140 05/27/2023 1318   K 4.3 05/27/2023 1318   CL 106 05/27/2023 1318   CO2 27 05/27/2023 1318   GLUCOSE 99 05/27/2023 1318    BUN 24 (H) 05/27/2023 1318   CREATININE 1.27 (H) 05/27/2023 1318   CALCIUM 9.5 05/27/2023 1318   PROT 7.1 05/27/2023 1318   ALBUMIN 4.5 05/27/2023 1318   AST 18 05/27/2023 1318   ALT 23 05/27/2023 1318   ALKPHOS 45 05/27/2023 1318   BILITOT 0.7 05/27/2023 1318   GFRNONAA 60 (L) 05/27/2023 1318   Lab Results  Component Value Date   WBC 9.8 05/27/2023   NEUTROABS 7.4 05/27/2023   HGB 15.2 05/27/2023   HCT 46.2 05/27/2023   MCV 87.8 05/27/2023   PLT 243 05/27/2023     Assessment/Plan Glioblastoma, IDH-wildtype (HCC)  Juan Garrett is clinically stable today, now having completed 4 weeks of IMRT and Temodar.  Labs within normal limits today.  We ultimately recommended continuing with course of intensity modulated radiation therapy and concurrent daily Temozolomide.  Radiation will be administered Mon-Fri over 6 weeks, Temodar will be dosed at 75mg /m2 to be given daily over 42 days.  We reviewed side effects of temodar, including fatigue, nausea/vomiting, constipation, and cytopenias.  Informed consent was verbally obtained at bedside to proceed with oral chemotherapy.  Chemotherapy should be held for the following:  ANC less than 1,000  Platelets less than 100,000  LFT or creatinine greater than 2x ULN  If clinical concerns/contraindications develop  Every 2 weeks during radiation, labs will be checked accompanied by a clinical evaluation in the brain tumor clinic.  Ok with Senna and Colace.   All questions were answered. The patient knows to call the clinic with any problems, questions or concerns. No barriers to learning were detected.  The total time spent in the encounter was 30 minutes and more than 50% was on counseling and review of test results   Henreitta Leber, MD Medical Director of Neuro-Oncology [facility] at [facility] 05/27/23 2:18 PM

## 2023-05-28 ENCOUNTER — Other Ambulatory Visit (HOSPITAL_COMMUNITY): Payer: Self-pay

## 2023-05-28 ENCOUNTER — Ambulatory Visit: Payer: Medicare Other

## 2023-05-28 ENCOUNTER — Other Ambulatory Visit: Payer: Self-pay

## 2023-05-28 ENCOUNTER — Ambulatory Visit
Admission: RE | Admit: 2023-05-28 | Discharge: 2023-05-28 | Discharge status: Home / Self Care | Payer: Medicare Other | Source: Ambulatory Visit | Attending: Radiation Oncology

## 2023-05-28 DIAGNOSIS — C719 Malignant neoplasm of brain, unspecified: Secondary | ICD-10-CM | POA: Diagnosis not present

## 2023-05-28 DIAGNOSIS — Z510 Encounter for antineoplastic radiation therapy: Secondary | ICD-10-CM | POA: Diagnosis not present

## 2023-05-28 LAB — RAD ONC ARIA SESSION SUMMARY
Course Elapsed Days: 29
Plan Fractions Treated to Date: 21
Plan Prescribed Dose Per Fraction: 2 Gy
Plan Total Fractions Prescribed: 22
Plan Total Prescribed Dose: 44 Gy
Reference Point Dosage Given to Date: 42 Gy
Reference Point Session Dosage Given: 2 Gy
Session Number: 21

## 2023-05-29 ENCOUNTER — Other Ambulatory Visit: Payer: Self-pay

## 2023-05-29 ENCOUNTER — Ambulatory Visit: Payer: Medicare Other

## 2023-05-29 ENCOUNTER — Ambulatory Visit
Admission: RE | Admit: 2023-05-29 | Discharge: 2023-05-29 | Disposition: A | Discharge status: Home / Self Care | Payer: Medicare Other | Source: Ambulatory Visit | Attending: Radiation Oncology

## 2023-05-29 DIAGNOSIS — C719 Malignant neoplasm of brain, unspecified: Secondary | ICD-10-CM | POA: Diagnosis not present

## 2023-05-29 DIAGNOSIS — Z510 Encounter for antineoplastic radiation therapy: Secondary | ICD-10-CM | POA: Diagnosis not present

## 2023-05-29 LAB — RAD ONC ARIA SESSION SUMMARY
Course Elapsed Days: 30
Plan Fractions Treated to Date: 22
Plan Prescribed Dose Per Fraction: 2 Gy
Plan Total Fractions Prescribed: 22
Plan Total Prescribed Dose: 44 Gy
Reference Point Dosage Given to Date: 44 Gy
Reference Point Session Dosage Given: 2 Gy
Session Number: 22

## 2023-05-30 ENCOUNTER — Other Ambulatory Visit: Payer: Self-pay

## 2023-05-30 ENCOUNTER — Ambulatory Visit: Payer: Medicare Other

## 2023-05-30 ENCOUNTER — Ambulatory Visit
Admission: RE | Admit: 2023-05-30 | Discharge: 2023-05-30 | Disposition: A | Discharge status: Home / Self Care | Payer: Medicare Other | Source: Ambulatory Visit | Attending: Radiation Oncology | Admitting: Radiation Oncology

## 2023-05-30 DIAGNOSIS — C719 Malignant neoplasm of brain, unspecified: Secondary | ICD-10-CM | POA: Diagnosis not present

## 2023-05-30 DIAGNOSIS — Z510 Encounter for antineoplastic radiation therapy: Secondary | ICD-10-CM | POA: Diagnosis not present

## 2023-05-30 LAB — RAD ONC ARIA SESSION SUMMARY
Course Elapsed Days: 31
Plan Fractions Treated to Date: 1
Plan Prescribed Dose Per Fraction: 2 Gy
Plan Total Fractions Prescribed: 8
Plan Total Prescribed Dose: 16 Gy
Reference Point Dosage Given to Date: 2 Gy
Reference Point Session Dosage Given: 2 Gy
Session Number: 23

## 2023-05-31 ENCOUNTER — Ambulatory Visit: Payer: Medicare Other

## 2023-05-31 ENCOUNTER — Ambulatory Visit
Admission: RE | Admit: 2023-05-31 | Discharge: 2023-05-31 | Disposition: A | Discharge status: Home / Self Care | Payer: Medicare Other | Source: Ambulatory Visit | Attending: Radiation Oncology | Admitting: Radiation Oncology

## 2023-05-31 ENCOUNTER — Other Ambulatory Visit: Payer: Self-pay

## 2023-05-31 ENCOUNTER — Other Ambulatory Visit: Payer: Self-pay | Admitting: Radiation Oncology

## 2023-05-31 DIAGNOSIS — Z510 Encounter for antineoplastic radiation therapy: Secondary | ICD-10-CM | POA: Diagnosis not present

## 2023-05-31 DIAGNOSIS — C719 Malignant neoplasm of brain, unspecified: Secondary | ICD-10-CM | POA: Diagnosis not present

## 2023-05-31 LAB — RAD ONC ARIA SESSION SUMMARY
Course Elapsed Days: 32
Plan Fractions Treated to Date: 2
Plan Prescribed Dose Per Fraction: 2 Gy
Plan Total Fractions Prescribed: 8
Plan Total Prescribed Dose: 16 Gy
Reference Point Dosage Given to Date: 4 Gy
Reference Point Session Dosage Given: 2 Gy
Session Number: 24

## 2023-05-31 MED ORDER — NEOMYCIN-POLYMYXIN-HC 3.5-10000-1 OT SOLN: 4.0000 [drp] | Freq: Four times a day (QID) | OTIC | 0 refills | Status: AC

## 2023-06-02 ENCOUNTER — Other Ambulatory Visit: Payer: Self-pay | Admitting: Radiation Oncology

## 2023-06-02 MED ORDER — AMOXICILLIN-POT CLAVULANATE 875-125 MG PO TABS: 1.0000 | ORAL_TABLET | Freq: Two times a day (BID) | ORAL | 0 refills | Status: AC

## 2023-06-02 NOTE — Progress Notes (Signed)
Started Otic Cortisporin on Friday for right external otitis.  I was contacted on call that his drainage was worse, so, I sent in Augmentin BID for 7 days.

## 2023-06-03 ENCOUNTER — Ambulatory Visit: Payer: Medicare Other

## 2023-06-03 ENCOUNTER — Other Ambulatory Visit: Payer: Self-pay

## 2023-06-03 ENCOUNTER — Ambulatory Visit
Admission: RE | Admit: 2023-06-03 | Discharge: 2023-06-03 | Disposition: A | Discharge status: Home / Self Care | Payer: Medicare Other | Source: Ambulatory Visit | Attending: Radiation Oncology | Admitting: Radiation Oncology

## 2023-06-03 DIAGNOSIS — Z510 Encounter for antineoplastic radiation therapy: Secondary | ICD-10-CM | POA: Diagnosis not present

## 2023-06-03 DIAGNOSIS — C719 Malignant neoplasm of brain, unspecified: Secondary | ICD-10-CM | POA: Diagnosis not present

## 2023-06-03 LAB — RAD ONC ARIA SESSION SUMMARY
Course Elapsed Days: 35
Plan Fractions Treated to Date: 3
Plan Prescribed Dose Per Fraction: 2 Gy
Plan Total Fractions Prescribed: 8
Plan Total Prescribed Dose: 16 Gy
Reference Point Dosage Given to Date: 6 Gy
Reference Point Session Dosage Given: 2 Gy
Session Number: 25

## 2023-06-04 ENCOUNTER — Ambulatory Visit: Payer: Medicare Other

## 2023-06-04 ENCOUNTER — Other Ambulatory Visit: Payer: Self-pay

## 2023-06-04 ENCOUNTER — Ambulatory Visit
Admission: RE | Admit: 2023-06-04 | Discharge: 2023-06-04 | Disposition: A | Discharge status: Home / Self Care | Payer: Medicare Other | Source: Ambulatory Visit | Attending: Radiation Oncology

## 2023-06-04 DIAGNOSIS — C719 Malignant neoplasm of brain, unspecified: Secondary | ICD-10-CM | POA: Diagnosis not present

## 2023-06-04 DIAGNOSIS — Z510 Encounter for antineoplastic radiation therapy: Secondary | ICD-10-CM | POA: Diagnosis not present

## 2023-06-04 LAB — RAD ONC ARIA SESSION SUMMARY
Course Elapsed Days: 36
Plan Fractions Treated to Date: 4
Plan Prescribed Dose Per Fraction: 2 Gy
Plan Total Fractions Prescribed: 8
Plan Total Prescribed Dose: 16 Gy
Reference Point Dosage Given to Date: 8 Gy
Reference Point Session Dosage Given: 2 Gy
Session Number: 26

## 2023-06-05 ENCOUNTER — Ambulatory Visit: Payer: Medicare Other

## 2023-06-06 ENCOUNTER — Ambulatory Visit: Payer: Medicare Other

## 2023-06-06 ENCOUNTER — Other Ambulatory Visit: Payer: Self-pay

## 2023-06-06 ENCOUNTER — Ambulatory Visit
Admission: RE | Admit: 2023-06-06 | Discharge: 2023-06-06 | Disposition: A | Discharge status: Home / Self Care | Payer: Medicare Other | Source: Ambulatory Visit | Attending: Radiation Oncology | Admitting: Radiation Oncology

## 2023-06-06 DIAGNOSIS — C719 Malignant neoplasm of brain, unspecified: Secondary | ICD-10-CM | POA: Diagnosis not present

## 2023-06-06 DIAGNOSIS — Z510 Encounter for antineoplastic radiation therapy: Secondary | ICD-10-CM | POA: Diagnosis not present

## 2023-06-06 LAB — RAD ONC ARIA SESSION SUMMARY
Course Elapsed Days: 38
Plan Fractions Treated to Date: 5
Plan Prescribed Dose Per Fraction: 2 Gy
Plan Total Fractions Prescribed: 8
Plan Total Prescribed Dose: 16 Gy
Reference Point Dosage Given to Date: 10 Gy
Reference Point Session Dosage Given: 2 Gy
Session Number: 27

## 2023-06-07 ENCOUNTER — Ambulatory Visit: Payer: Medicare Other

## 2023-06-07 ENCOUNTER — Other Ambulatory Visit: Payer: Self-pay

## 2023-06-07 ENCOUNTER — Ambulatory Visit
Admission: RE | Admit: 2023-06-07 | Discharge: 2023-06-07 | Disposition: A | Discharge status: Home / Self Care | Payer: Medicare Other | Source: Ambulatory Visit | Attending: Radiation Oncology

## 2023-06-07 ENCOUNTER — Ambulatory Visit
Admission: RE | Admit: 2023-06-07 | Discharge: 2023-06-07 | Disposition: A | Discharge status: Home / Self Care | Payer: Medicare Other | Source: Ambulatory Visit | Attending: Radiation Oncology | Admitting: Radiation Oncology

## 2023-06-07 DIAGNOSIS — C719 Malignant neoplasm of brain, unspecified: Secondary | ICD-10-CM | POA: Diagnosis not present

## 2023-06-07 DIAGNOSIS — Z510 Encounter for antineoplastic radiation therapy: Secondary | ICD-10-CM | POA: Diagnosis not present

## 2023-06-07 LAB — RAD ONC ARIA SESSION SUMMARY
Course Elapsed Days: 39
Plan Fractions Treated to Date: 6
Plan Prescribed Dose Per Fraction: 2 Gy
Plan Total Fractions Prescribed: 8
Plan Total Prescribed Dose: 16 Gy
Reference Point Dosage Given to Date: 12 Gy
Reference Point Session Dosage Given: 2 Gy
Session Number: 28

## 2023-06-08 ENCOUNTER — Other Ambulatory Visit (HOSPITAL_BASED_OUTPATIENT_CLINIC_OR_DEPARTMENT_OTHER): Payer: Self-pay

## 2023-06-10 ENCOUNTER — Other Ambulatory Visit: Payer: Self-pay

## 2023-06-10 ENCOUNTER — Ambulatory Visit: Payer: Medicare Other

## 2023-06-10 ENCOUNTER — Inpatient Hospital Stay: Payer: Medicare Other | Admitting: Internal Medicine

## 2023-06-10 ENCOUNTER — Ambulatory Visit
Admission: RE | Admit: 2023-06-10 | Discharge: 2023-06-10 | Disposition: A | Discharge status: Home / Self Care | Payer: Medicare Other | Source: Ambulatory Visit | Attending: Radiation Oncology | Admitting: Radiation Oncology

## 2023-06-10 ENCOUNTER — Inpatient Hospital Stay: Payer: Medicare Other

## 2023-06-10 VITALS — BP 118/73 | HR 71 | Temp 98.1°F | Resp 18 | Ht 65.0 in | Wt 176.7 lb

## 2023-06-10 DIAGNOSIS — C719 Malignant neoplasm of brain, unspecified: Secondary | ICD-10-CM

## 2023-06-10 DIAGNOSIS — Z510 Encounter for antineoplastic radiation therapy: Secondary | ICD-10-CM | POA: Diagnosis not present

## 2023-06-10 DIAGNOSIS — C712 Malignant neoplasm of temporal lobe: Secondary | ICD-10-CM | POA: Diagnosis not present

## 2023-06-10 LAB — CMP (CANCER CENTER ONLY)
ALT: 22 U/L (ref 0–44)
AST: 19 U/L (ref 15–41)
Albumin: 4.2 g/dL (ref 3.5–5.0)
Alkaline Phosphatase: 43 U/L (ref 38–126)
Anion gap: 5 (ref 5–15)
BUN: 30 mg/dL — ABNORMAL HIGH (ref 8–23)
CO2: 29 mmol/L (ref 22–32)
Calcium: 9.4 mg/dL (ref 8.9–10.3)
Chloride: 108 mmol/L (ref 98–111)
Creatinine: 1.24 mg/dL (ref 0.61–1.24)
GFR, Estimated: >60 mL/min (ref >60)
Glucose, Bld: 93 mg/dL (ref 70–99)
Potassium: 4.3 mmol/L (ref 3.5–5.1)
Sodium: 142 mmol/L (ref 135–145)
Total Bilirubin: 0.5 mg/dL (ref 0.0–1.2)
Total Protein: 6.9 g/dL (ref 6.5–8.1)

## 2023-06-10 LAB — CBC WITH DIFFERENTIAL (CANCER CENTER ONLY)
Abs Immature Granulocytes: 0.01 10*3/uL (ref 0.00–0.07)
Basophils Absolute: 0.1 10*3/uL (ref 0.0–0.1)
Basophils Relative: 1 %
Eosinophils Absolute: 0.3 10*3/uL (ref 0.0–0.5)
Eosinophils Relative: 5 %
HCT: 43.4 % (ref 39.0–52.0)
Hemoglobin: 14.9 g/dL (ref 13.0–17.0)
Immature Granulocytes: 0 %
Lymphocytes Relative: 21 %
Lymphs Abs: 1.1 10*3/uL (ref 0.7–4.0)
MCH: 29.6 pg (ref 26.0–34.0)
MCHC: 34.3 g/dL (ref 30.0–36.0)
MCV: 86.3 fL (ref 80.0–100.0)
Monocytes Absolute: 0.7 10*3/uL (ref 0.1–1.0)
Monocytes Relative: 14 %
Neutro Abs: 2.9 10*3/uL (ref 1.7–7.7)
Neutrophils Relative %: 59 %
Platelet Count: 172 10*3/uL (ref 150–400)
RBC: 5.03 MIL/uL (ref 4.22–5.81)
RDW: 13.3 % (ref 11.5–15.5)
WBC Count: 5 10*3/uL (ref 4.0–10.5)
nRBC: 0 % (ref 0.0–0.2)

## 2023-06-10 LAB — RAD ONC ARIA SESSION SUMMARY
Course Elapsed Days: 42
Plan Fractions Treated to Date: 7
Plan Prescribed Dose Per Fraction: 2 Gy
Plan Total Fractions Prescribed: 8
Plan Total Prescribed Dose: 16 Gy
Reference Point Dosage Given to Date: 14 Gy
Reference Point Session Dosage Given: 2 Gy
Session Number: 29

## 2023-06-10 NOTE — Progress Notes (Signed)
[facility] [address]  [place], [place] [address] [phone]   Interval Evaluation  Date of Service: 06/10/23 Patient Name: Juan Garrett Patient MRN: [id number] Patient DOB: 1951/01/09 Provider: Henreitta Leber, MD  Identifying Statement:  Sadarius Krieger is a 72 y.o. male with right temporal glioblastoma   Oncologic History: Oncology History  Glioblastoma, IDH-wildtype (HCC)  01/31/2023 Surgery   Craniotomy, resection of right temporal mass with Dr. Lovell Sheehan; pathology demonstrates Glioblastoma, IDH-wt   04/05/2023 Surgery   Repeat right temporal resection with Dr. Zachery Conch at [facility]   04/29/2023 -  Radiation Therapy   IMRT with concurrent Temodar 75mg /m2     Biomarkers:  MGMT Unknown.  IDH 1/2 Wild type.  EGFR Unknown  TERT Unknown   Interval History: Narciso Utt presents today for follow up, now in final week of IMRT and Temodar.  He is feeling well, no new or progressive neurologic deficits.  Only modest fatigue.  Ear symptoms improved with antibiotics and ear drops.  Denies headaches, seizures.  Constipation still an issue, mildly improved with Senna tablets.   H+P (02/28/23) Patient presented to neurologic attention in August 2024 with several weeks of progressive confusion, disorientation, and several falls.  CNS imaging demonstrated large cystic mass in right temporal lobe.  He underwent craniotomy, resection with Dr. Lovell Sheehan on 01/31/23 without complication, pathology demonstrated glioblastoma.  Since surgery, he has been at home, doing well.  No issues with gait, no recurrence of confusion, visual spatial issues.  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  Medications: Current Outpatient Medications on File Prior to Visit  Medication Sig Dispense Refill   amLODipine-benazepril (LOTREL) 5-10 MG capsule Take 1 capsule by mouth daily.     atorvastatin (LIPITOR) 10 MG tablet Take 10 mg by mouth daily.      famotidine (PEPCID) 40 MG tablet Take 40 mg by mouth daily.     Fexofenadine HCl (ALLEGRA PO) Take by mouth daily.     fluticasone (FLONASE) 50 MCG/ACT nasal spray Place 1 spray into both nostrils daily.     Fluticasone Furoate (ARNUITY ELLIPTA) 50 MCG/ACT AEPB      HYDROcodone-acetaminophen (NORCO/VICODIN) 5-325 MG tablet Take 1 tablet by mouth every 4 (four) hours as needed for moderate pain. 30 tablet 0   ibuprofen (ADVIL) 200 MG tablet Take 200 mg by mouth daily as needed for mild pain.     neomycin-polymyxin-hydrocortisone (CORTISPORIN) OTIC solution Place 4 drops into the right ear 4 (four) times daily for 10 days. 10 mL 0   ondansetron (ZOFRAN) 8 MG tablet Take 1 tablet (8 mg total) by mouth every 8 (eight) hours as needed for nausea or vomiting. May take 30-60 minutes prior to Temodar administration if nausea/vomiting occurs as needed. 30 tablet 1   temozolomide (TEMODAR) 140 MG capsule Take 1 capsule (140 mg total) by mouth daily. May take on an empty stomach to decrease nausea & vomiting. 42 capsule 0   Vitamin D, Ergocalciferol, (DRISDOL) 1.25 MG (50000 UNIT) CAPS capsule Take 50,000 Units by mouth every 14 (fourteen) days.     No current facility-administered medications on file prior to visit.    Allergies:  Allergies  Allergen Reactions   Aspirin Other (See Comments)    Upset stomach   Past Medical History:  Past Medical History:  Diagnosis Date   Allergy    seasonal allergies   Arthritis    bilateral knees   Cataract    not  a surgical candidate at this time (07/06/2020)   GERD (gastroesophageal reflux disease)    on meds   Hypertension    on meds   Past Surgical History:  Past Surgical History:  Procedure Laterality Date   APPLICATION OF CRANIAL NAVIGATION N/A 01/31/2023   Procedure: APPLICATION OF CRANIAL NAVIGATION;  Surgeon: Tressie Stalker, MD;  Location: [facility] OR;  Service: Neurosurgery;  Laterality: N/A;   CARPAL TUNNEL RELEASE Bilateral 1997    CHONDROPLASTY Right 09/23/2019   Procedure: CHONDROPLASTY;  Surgeon: Tarry Kos, MD;  Location: [facility];  Service: Orthopedics;  Laterality: Right;   CRANIOTOMY Right 01/31/2023   Procedure: CRANIOTOMY TUMOR EXCISION;  Surgeon: Tressie Stalker, MD;  Location: [facility] OR;  Service: Neurosurgery;  Laterality: Right;   KNEE ARTHROSCOPY WITH MEDIAL MENISECTOMY Right 09/23/2019   Procedure: RIGHT KNEE ARTHROSCOPY WITH PARTIAL MEDIAL MENISCECTOMY;  Surgeon: Tarry Kos, MD;  Location: [facility];  Service: Orthopedics;  Laterality: Right;   KNEE SURGERY Left 1992   Social History:  Social History   Socioeconomic History   Marital status: Married    Spouse name: Not on file   Number of children: Not on file   Years of education: Not on file   Highest education level: Not on file  Occupational History   Not on file  Tobacco Use   Smoking status: Former    Types: Cigarettes   Smokeless tobacco: Never  Vaping Use   Vaping status: Never Used  Substance and Sexual Activity   Alcohol use: Not Currently   Drug use: Never   Sexual activity: Not on file  Other Topics Concern   Not on file  Social History Narrative   Not on file   Social Drivers of Health   Financial Resource Strain: Not on file  Food Insecurity: No Food Insecurity (03/04/2023)   Hunger Vital Sign    Worried About Running Out of Food in the Last Year: Never true    Ran Out of Food in the Last Year: Never true  Transportation Needs: No Transportation Needs (04/05/2023)   Received from [facility] - Transportation    In the past 12 months, has lack of transportation kept you from medical appointments or from getting medications?: No    Lack of Transportation (Non-Medical): No  Physical Activity: Not on file  Stress: Not on file  Social Connections: Not on file  Intimate Partner Violence: Not At Risk (03/04/2023)   Humiliation, Afraid, Rape, and Kick questionnaire     Fear of Current or Ex-Partner: No    Emotionally Abused: No    Physically Abused: No    Sexually Abused: No   Family History:  Family History  Problem Relation Age of Onset   Colon polyps Neg Hx    Colon cancer Neg Hx    Esophageal cancer Neg Hx    Stomach cancer Neg Hx    Rectal cancer Neg Hx     Review of Systems: Constitutional: Doesn't report fevers, chills or abnormal weight loss Eyes: Doesn't report blurriness of vision Ears, nose, mouth, throat, and face: Doesn't report sore throat Respiratory: Doesn't report cough, dyspnea or wheezes Cardiovascular: Doesn't report palpitation, chest discomfort  Gastrointestinal:  Doesn't report nausea, constipation, diarrhea GU: Doesn't report incontinence Skin: Doesn't report skin rashes Neurological: Per HPI Musculoskeletal: Doesn't report joint pain Behavioral/Psych: Doesn't report anxiety  Physical Exam: Vitals:   06/10/23 1423  BP: 118/73  Pulse: 71  Resp: 18  Temp: 98.1 F (36.7 C)  SpO2: 98%   KPS: 90. General: Alert, cooperative, pleasant, in no acute distress Head: Normal EENT: No conjunctival injection or scleral icterus.  Lungs: Resp effort normal Cardiac: Regular rate Abdomen: Non-distended abdomen Skin: No rashes cyanosis or petechiae. Extremities: No clubbing or edema  Neurologic Exam: Mental Status: Awake, alert, attentive to examiner. Oriented to self and environment. Language is fluent with intact comprehension.  Cranial Nerves: Visual acuity is grossly normal. Visual fields are full. Extra-ocular movements intact. No ptosis. Face is symmetric Motor: Tone and bulk are normal. Power is full in both arms and legs. Reflexes are symmetric, no pathologic reflexes present.  Sensory: Intact to light touch Gait: Normal.   Labs: I have reviewed the data as listed    Component Value Date/Time   NA 142 06/10/2023 1304   K 4.3 06/10/2023 1304   CL 108 06/10/2023 1304   CO2 29 06/10/2023 1304   GLUCOSE  93 06/10/2023 1304   BUN 30 (H) 06/10/2023 1304   CREATININE 1.24 06/10/2023 1304   CALCIUM 9.4 06/10/2023 1304   PROT 6.9 06/10/2023 1304   ALBUMIN 4.2 06/10/2023 1304   AST 19 06/10/2023 1304   ALT 22 06/10/2023 1304   ALKPHOS 43 06/10/2023 1304   BILITOT 0.5 06/10/2023 1304   GFRNONAA >60 06/10/2023 1304   Lab Results  Component Value Date   WBC 5.0 06/10/2023   NEUTROABS 2.9 06/10/2023   HGB 14.9 06/10/2023   HCT 43.4 06/10/2023   MCV 86.3 06/10/2023   PLT 172 06/10/2023     Assessment/Plan Glioblastoma, IDH-wildtype (HCC)  Sabre Morioka is clinically stable today, now in final week of IMRT and Temodar.  Labs within normal limits today.  We ultimately recommended completing course of intensity modulated radiation therapy and concurrent daily Temozolomide.  Radiation will be administered Mon-Fri over 6 weeks, Temodar will be dosed at 75mg /m2 to be given daily over 42 days.  We reviewed side effects of temodar, including fatigue, nausea/vomiting, constipation, and cytopenias.  Informed consent was verbally obtained at bedside to proceed with oral chemotherapy.  Chemotherapy should be held for the following:  ANC less than 1,000  Platelets less than 100,000  LFT or creatinine greater than 2x ULN  If clinical concerns/contraindications develop  Ok with Senna and Colace.   We ask that Areli Pinuelas return to clinic in 1 months following next brain MRI, or sooner as needed.  All questions were answered. The patient knows to call the clinic with any problems, questions or concerns. No barriers to learning were detected.  The total time spent in the encounter was 30 minutes and more than 50% was on counseling and review of test results   Henreitta Leber, MD Medical Director of Neuro-Oncology [facility] 06/10/23 2:18 PM

## 2023-06-11 ENCOUNTER — Ambulatory Visit: Payer: Medicare Other

## 2023-06-11 ENCOUNTER — Other Ambulatory Visit: Payer: Self-pay

## 2023-06-11 ENCOUNTER — Ambulatory Visit
Admission: RE | Admit: 2023-06-11 | Discharge: 2023-06-11 | Disposition: A | Discharge status: Home / Self Care | Payer: Medicare Other | Source: Ambulatory Visit | Attending: Radiation Oncology

## 2023-06-11 DIAGNOSIS — C719 Malignant neoplasm of brain, unspecified: Secondary | ICD-10-CM | POA: Diagnosis not present

## 2023-06-11 DIAGNOSIS — Z510 Encounter for antineoplastic radiation therapy: Secondary | ICD-10-CM | POA: Diagnosis not present

## 2023-06-11 LAB — RAD ONC ARIA SESSION SUMMARY
Course Elapsed Days: 43
Plan Fractions Treated to Date: 8
Plan Prescribed Dose Per Fraction: 2 Gy
Plan Total Fractions Prescribed: 8
Plan Total Prescribed Dose: 16 Gy
Reference Point Dosage Given to Date: 16 Gy
Reference Point Session Dosage Given: 2 Gy
Session Number: 30

## 2023-06-12 ENCOUNTER — Other Ambulatory Visit: Payer: Self-pay

## 2023-06-13 ENCOUNTER — Ambulatory Visit: Payer: Medicare Other

## 2023-06-13 NOTE — Radiation Completion Notes (Signed)
 Patient Name: Juan Garrett, Juan Garrett MRN: [id number] Date of Birth: [birth year] Referring Physician: REYES BUDGE, M.D. Date of Service: 2023-06-13 Radiation Oncologist: Adina Barge, M.D. [facility]                             RADIATION ONCOLOGY END OF TREATMENT NOTE     Diagnosis: D49.6 Neoplasm of unspecified behavior of brain Intent: Curative     ==========DELIVERED PLANS==========  First Treatment Date: 2023-04-29 Last Treatment Date: 2023-06-11   Plan Name: Brain_Initial Site: Brain Technique: IMRT Mode: Photon Dose Per Fraction: 2 Gy Prescribed Dose (Delivered / Prescribed): 44 Gy / 44 Gy Prescribed Fxs (Delivered / Prescribed): 22 / 22   Plan Name: Brain_Bst Site: Brain Technique: IMRT Mode: Photon Dose Per Fraction: 2 Gy Prescribed Dose (Delivered / Prescribed): 16 Gy / 16 Gy Prescribed Fxs (Delivered / Prescribed): 8 / 8     ==========ON TREATMENT VISIT DATES========== 2023-05-03, 2023-05-13, 2023-05-16, 2023-05-22, 2023-05-31, 2023-06-07     ==========UPCOMING VISITS========== 07/09/2023 [facility]-RADIATION ONC POST TREATMENT CALL [facility]-POST TREATMENT  06/25/2023 [facility]-MRI MR BRAIN W WO CONTRAST [facility]-MR 1        ==========APPENDIX - ON TREATMENT VISIT NOTES==========   See weekly On Treatment Notes in Epic for details in the Media tab (listed as Progress notes on the On Treatment Visit Dates listed above).

## 2023-06-14 ENCOUNTER — Ambulatory Visit: Payer: Medicare Other

## 2023-06-14 ENCOUNTER — Other Ambulatory Visit: Payer: Self-pay

## 2023-06-17 ENCOUNTER — Ambulatory Visit: Payer: Medicare Other

## 2023-06-18 ENCOUNTER — Ambulatory Visit: Payer: Medicare Other

## 2023-06-19 ENCOUNTER — Ambulatory Visit: Payer: Medicare Other

## 2023-06-20 ENCOUNTER — Other Ambulatory Visit: Payer: Self-pay

## 2023-06-20 ENCOUNTER — Encounter: Payer: Self-pay | Admitting: Internal Medicine

## 2023-06-20 ENCOUNTER — Ambulatory Visit: Payer: Medicare Other

## 2023-06-20 NOTE — Progress Notes (Signed)
 Patient completed therapy. Disenrolling from [facility].

## 2023-06-24 ENCOUNTER — Encounter: Payer: Self-pay | Admitting: Internal Medicine

## 2023-06-24 ENCOUNTER — Telehealth: Payer: Self-pay

## 2023-06-24 NOTE — Telephone Encounter (Signed)
 Received VM from pt, stating that he was returning a call from Dr. Eward office. Upon reviewing chart, this RN does not see that anyone has tried to reach out to patient recently. Returned call to inform pt of this, but there was no answer. Left a VM informing him of the above information. Suggested calling at a later time to see if Dr. Eward routine RN, Luke, may know anything about this.

## 2023-06-25 ENCOUNTER — Ambulatory Visit (HOSPITAL_COMMUNITY)
Admission: RE | Admit: 2023-06-25 | Discharge: 2023-06-25 | Disposition: A | Discharge status: Home / Self Care | Payer: Medicare Other | Source: Ambulatory Visit | Attending: Internal Medicine | Admitting: Internal Medicine

## 2023-06-25 ENCOUNTER — Telehealth: Payer: Self-pay | Admitting: *Deleted

## 2023-06-25 ENCOUNTER — Telehealth: Payer: Self-pay | Admitting: Internal Medicine

## 2023-06-25 DIAGNOSIS — C719 Malignant neoplasm of brain, unspecified: Secondary | ICD-10-CM | POA: Diagnosis present

## 2023-06-25 MED ORDER — GADOBUTROL 1 MMOL/ML IV SOLN
8.0000 mL | Freq: Once | INTRAVENOUS | Status: AC | PRN
Start: 2023-06-25 — End: 2023-06-25
  Administered 2023-06-25: 8 mL via INTRAVENOUS

## 2023-06-25 NOTE — Telephone Encounter (Signed)
 Request for today's MRI images to be pushed to [facility] through [employer].

## 2023-06-25 NOTE — Telephone Encounter (Signed)
 Left patient a voicemail in regards to scheduled appointment to review recent MRI scan; left patient callback number if needed for scheduling

## 2023-07-08 ENCOUNTER — Inpatient Hospital Stay: Payer: Medicare Other | Attending: Internal Medicine | Admitting: Internal Medicine

## 2023-07-08 ENCOUNTER — Other Ambulatory Visit: Payer: Self-pay

## 2023-07-08 ENCOUNTER — Encounter: Payer: Self-pay | Admitting: Internal Medicine

## 2023-07-08 ENCOUNTER — Telehealth: Payer: Self-pay | Admitting: Internal Medicine

## 2023-07-08 ENCOUNTER — Telehealth: Payer: Self-pay | Admitting: Pharmacist

## 2023-07-08 ENCOUNTER — Other Ambulatory Visit (HOSPITAL_COMMUNITY): Payer: Self-pay

## 2023-07-08 ENCOUNTER — Telehealth: Payer: Self-pay | Admitting: Pharmacy Technician

## 2023-07-08 VITALS — BP 110/70 | HR 67 | Temp 98.1°F | Resp 18 | Ht 65.0 in | Wt 171.3 lb

## 2023-07-08 DIAGNOSIS — Z87891 Personal history of nicotine dependence: Secondary | ICD-10-CM | POA: Insufficient documentation

## 2023-07-08 DIAGNOSIS — C719 Malignant neoplasm of brain, unspecified: Secondary | ICD-10-CM

## 2023-07-08 DIAGNOSIS — R112 Nausea with vomiting, unspecified: Secondary | ICD-10-CM | POA: Diagnosis not present

## 2023-07-08 DIAGNOSIS — R5383 Other fatigue: Secondary | ICD-10-CM | POA: Diagnosis not present

## 2023-07-08 DIAGNOSIS — C712 Malignant neoplasm of temporal lobe: Secondary | ICD-10-CM | POA: Diagnosis present

## 2023-07-08 DIAGNOSIS — Z886 Allergy status to analgesic agent status: Secondary | ICD-10-CM | POA: Diagnosis not present

## 2023-07-08 DIAGNOSIS — K5900 Constipation, unspecified: Secondary | ICD-10-CM | POA: Diagnosis not present

## 2023-07-08 DIAGNOSIS — Z79899 Other long term (current) drug therapy: Secondary | ICD-10-CM | POA: Diagnosis not present

## 2023-07-08 DIAGNOSIS — Z9889 Other specified postprocedural states: Secondary | ICD-10-CM | POA: Diagnosis not present

## 2023-07-08 MED ORDER — TEMOZOLOMIDE 140 MG PO CAPS: 150.0000 mg/m2/d | ORAL_CAPSULE | Freq: Every day | ORAL | 0 refills | Status: DC

## 2023-07-08 NOTE — Telephone Encounter (Signed)
Oral Oncology Patient Advocate Encounter  After completing a benefits investigation, prior authorization for temozolomide is not required at this time through medicare B.  Patient's copay is $19.64.     Jinger Neighbors, CPhT-Adv Oncology Pharmacy Patient Advocate [facility] Direct Number: [phone]  Fax: [phone]

## 2023-07-08 NOTE — Telephone Encounter (Signed)
Marland Kitchen

## 2023-07-08 NOTE — Progress Notes (Signed)
[facility] [address]  [place], [place] [address] [phone]   Interval Evaluation  Date of Service: 07/08/23 Patient Name: Juan Garrett Patient MRN: [id number] Patient DOB: 1951-01-24 Provider: Henreitta Leber, MD  Identifying Statement:  Juan Garrett is a 73 y.o. male with right temporal glioblastoma   Oncologic History: Oncology History  Glioblastoma, IDH-wildtype (HCC)  01/31/2023 Surgery   Craniotomy, resection of right temporal mass with Dr. Lovell Sheehan; pathology demonstrates Glioblastoma, IDH-wt   04/05/2023 Surgery   Repeat right temporal resection with Dr. Zachery Conch at [facility]   04/29/2023 -  Radiation Therapy   IMRT with concurrent Temodar 75mg /m2     Biomarkers:  MGMT Unknown.  IDH 1/2 Wild type.  EGFR Unknown  TERT Unknown   Interval History: Juan Garrett presents today for follow up, now having completed IMRT and Temodar, recent MRI brain.  He is feeling well, no new or progressive neurologic deficits.  Only modest fatigue.  Denies headaches, seizures.  Constipation still an issue, mildly improved with Senna tablets.   H+P (02/28/23) Patient presented to neurologic attention in August 2024 with several weeks of progressive confusion, disorientation, and several falls.  CNS imaging demonstrated large cystic mass in right temporal lobe.  He underwent craniotomy, resection with Dr. Lovell Sheehan on 01/31/23 without complication, pathology demonstrated glioblastoma.  Since surgery, he has been at home, doing well.  No issues with gait, no recurrence of confusion, visual spatial issues.  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  Medications: Current Outpatient Medications on File Prior to Visit  Medication Sig Dispense Refill   amLODipine-benazepril (LOTREL) 5-10 MG capsule Take 1 capsule by mouth daily.     atorvastatin (LIPITOR) 10 MG tablet Take 10 mg by mouth daily.     famotidine (PEPCID) 40 MG tablet  Take 40 mg by mouth daily.     Fexofenadine HCl (ALLEGRA PO) Take by mouth daily.     fluticasone (FLONASE) 50 MCG/ACT nasal spray Place 1 spray into both nostrils daily.     Fluticasone Furoate (ARNUITY ELLIPTA) 50 MCG/ACT AEPB      HYDROcodone-acetaminophen (NORCO/VICODIN) 5-325 MG tablet Take 1 tablet by mouth every 4 (four) hours as needed for moderate pain. 30 tablet 0   ibuprofen (ADVIL) 200 MG tablet Take 200 mg by mouth daily as needed for mild pain.     ondansetron (ZOFRAN) 8 MG tablet Take 1 tablet (8 mg total) by mouth every 8 (eight) hours as needed for nausea or vomiting. May take 30-60 minutes prior to Temodar administration if nausea/vomiting occurs as needed. 30 tablet 1   Vitamin D, Ergocalciferol, (DRISDOL) 1.25 MG (50000 UNIT) CAPS capsule Take 50,000 Units by mouth every 14 (fourteen) days.     No current facility-administered medications on file prior to visit.    Allergies:  Allergies  Allergen Reactions   Aspirin Other (See Comments)    Upset stomach   Past Medical History:  Past Medical History:  Diagnosis Date   Allergy    seasonal allergies   Arthritis    bilateral knees   Cataract    not a surgical candidate at this time (07/06/2020)   GERD (gastroesophageal reflux disease)    on meds   Hypertension    on meds   Past Surgical History:  Past Surgical History:  Procedure Laterality Date   APPLICATION OF CRANIAL NAVIGATION N/A 01/31/2023   Procedure: APPLICATION OF CRANIAL NAVIGATION;  Surgeon: Tressie Stalker, MD;  [facility] OR;  Service: Neurosurgery;  Laterality: N/A;   CARPAL TUNNEL RELEASE Bilateral 1997   CHONDROPLASTY Right 09/23/2019   Procedure: CHONDROPLASTY;  Surgeon: Tarry Kos, MD;  Location: [facility];  Service: Orthopedics;  Laterality: Right;   CRANIOTOMY Right 01/31/2023   Procedure: CRANIOTOMY TUMOR EXCISION;  Surgeon: Tressie Stalker, MD;  Location: [facility] OR;  Service: Neurosurgery;  Laterality: Right;   KNEE  ARTHROSCOPY WITH MEDIAL MENISECTOMY Right 09/23/2019   Procedure: RIGHT KNEE ARTHROSCOPY WITH PARTIAL MEDIAL MENISCECTOMY;  Surgeon: Tarry Kos, MD;  Location: [facility];  Service: Orthopedics;  Laterality: Right;   KNEE SURGERY Left 1992   Social History:  Social History   Socioeconomic History   Marital status: Married    Spouse name: Not on file   Number of children: Not on file   Years of education: Not on file   Highest education level: Not on file  Occupational History   Not on file  Tobacco Use   Smoking status: Former    Types: Cigarettes   Smokeless tobacco: Never  Vaping Use   Vaping status: Never Used  Substance and Sexual Activity   Alcohol use: Not Currently   Drug use: Never   Sexual activity: Not on file  Other Topics Concern   Not on file  Social History Narrative   Not on file   Social Drivers of Health   Financial Resource Strain: Not on file  Food Insecurity: No Food Insecurity (03/04/2023)   Hunger Vital Sign    Worried About Running Out of Food in the Last Year: Never true    Ran Out of Food in the Last Year: Never true  Transportation Needs: No Transportation Needs (04/05/2023)   Received from [facility] - [facility]    In the past 12 months, has lack of transportation kept you from medical appointments or from getting medications?: No    Lack of Transportation (Non-Medical): No  Physical Activity: Not on file  Stress: Not on file  Social Connections: Not on file  Intimate Partner Violence: Not At Risk (03/04/2023)   Humiliation, Afraid, Rape, and Kick questionnaire    Fear of Current or Ex-Partner: No    Emotionally Abused: No    Physically Abused: No    Sexually Abused: No   Family History:  Family History  Problem Relation Age of Onset   Colon polyps Neg Hx    Colon cancer Neg Hx    Esophageal cancer Neg Hx    Stomach cancer Neg Hx    Rectal cancer Neg Hx     Review of  Systems: Constitutional: Doesn't report fevers, chills or abnormal weight loss Eyes: Doesn't report blurriness of vision Ears, nose, mouth, throat, and face: Doesn't report sore throat Respiratory: Doesn't report cough, dyspnea or wheezes Cardiovascular: Doesn't report palpitation, chest discomfort  Gastrointestinal:  Doesn't report nausea, constipation, diarrhea GU: Doesn't report incontinence Skin: Doesn't report skin rashes Neurological: Per HPI Musculoskeletal: Doesn't report joint pain Behavioral/Psych: Doesn't report anxiety  Physical Exam: Vitals:   07/08/23 1217  BP: 110/70  Pulse: 67  Resp: 18  Temp: 98.1 F (36.7 C)  SpO2: 97%   KPS: 90. General: Alert, cooperative, pleasant, in no acute distress Head: Normal EENT: No conjunctival injection or scleral icterus.  Lungs: Resp effort normal Cardiac: Regular rate Abdomen: Non-distended abdomen Skin: No rashes cyanosis or petechiae. Extremities: No clubbing or edema  Neurologic Exam: Mental Status: Awake, alert, attentive to  examiner. Oriented to self and environment. Language is fluent with intact comprehension.  Cranial Nerves: Visual acuity is grossly normal. Visual fields are full. Extra-ocular movements intact. No ptosis. Face is symmetric Motor: Tone and bulk are normal. Power is full in both arms and legs. Reflexes are symmetric, no pathologic reflexes present.  Sensory: Intact to light touch Gait: Normal.   Labs: I have reviewed the data as listed    Component Value Date/Time   NA 142 06/10/2023 1304   K 4.3 06/10/2023 1304   CL 108 06/10/2023 1304   CO2 29 06/10/2023 1304   GLUCOSE 93 06/10/2023 1304   BUN 30 (H) 06/10/2023 1304   CREATININE 1.24 06/10/2023 1304   CALCIUM 9.4 06/10/2023 1304   PROT 6.9 06/10/2023 1304   ALBUMIN 4.2 06/10/2023 1304   AST 19 06/10/2023 1304   ALT 22 06/10/2023 1304   ALKPHOS 43 06/10/2023 1304   BILITOT 0.5 06/10/2023 1304   GFRNONAA >60 06/10/2023 1304   Lab  Results  Component Value Date   WBC 5.0 06/10/2023   NEUTROABS 2.9 06/10/2023   HGB 14.9 06/10/2023   HCT 43.4 06/10/2023   MCV 86.3 06/10/2023   PLT 172 06/10/2023    Imaging:  [facility] Clinician Interpretation: I have personally reviewed the CNS images as listed.  My interpretation, in the context of the patient's clinical presentation, is stable disease  MR BRAIN W WO CONTRAST Result Date: 06/25/2023 CLINICAL DATA:  Brain/CNS neoplasm. Assess treatment response. Glioblastoma. EXAM: MRI HEAD WITHOUT AND WITH CONTRAST TECHNIQUE: Multiplanar, multiecho pulse sequences of the brain and surrounding structures were obtained without and with intravenous contrast. CONTRAST:  8mL GADAVIST GADOBUTROL 1 MMOL/ML IV SOLN COMPARISON:  04/15/2023.  03/20/2023.  Multiple previous. FINDINGS: Brain: No abnormality affects the brainstem or cerebellum. Left cerebral hemisphere is normal. Previous right lateral craniotomy for resection of a lateral right temporal lobe mass. Post resection space does not show any complicating features. The margins of the post resection space show only thin enhancement, with the single exception of the medial roof which shows a 3 mm focus of slightly thicker enhancement that can be followed on subsequent exams. Mild T2 and FLAIR signal emanates outward from the roof of the resection within the white matter. No abnormal enhancement present elsewhere. No hydrocephalus. Vascular: Major vessels at the base of the brain show flow. Skull and upper cervical spine: Negative otherwise. Sinuses/Orbits: Paranasal sinuses are clear.  Orbits are normal. Other: Right mastoid effusion. IMPRESSION: 1. Previous right lateral craniotomy for resection of a lateral right temporal lobe mass. Post resection space does not show any complicating features. The margins of the post resection space show only thin enhancement, with the single exception of the medial roof which shows a 3 mm focus of slightly thicker  enhancement that can be followed on subsequent exams. Mild T2 and FLAIR signal emanates outward from the roof of the resection within the white matter. 2. Right mastoid effusion. Electronically Signed   By: Paulina Fusi M.D.   On: 06/25/2023 10:31    Assessment/Plan Glioblastoma, IDH-wildtype (HCC)  Orva Gwaltney Udall is clinically stable today, now having completed IMRT and Temodar.  Labs within normal limits today.  We recommended initiating treatment with Temozolomide 150mg /m2, on for five days and off for twenty three days in twenty eight day cycles. The patient will have a complete blood count performed on days 21 and 28 of each cycle, and a comprehensive metabolic panel performed on day 28 of each cycle.  Labs may need to be performed more often. Zofran will prescribed for home use for nausea/vomiting.   Informed consent was obtained verbally at bedside to proceed with oral chemotherapy.  Chemotherapy should be held for the following:  ANC less than 1,000  Platelets less than 100,000  LFT or creatinine greater than 2x ULN  If clinical concerns/contraindications develop  We ask that Infant Zink [place] return to clinic in 1 months with labs prior to cycle #2, or sooner as needed.  All questions were answered. The patient knows to call the clinic with any problems, questions or concerns. No barriers to learning were detected.  The total time spent in the encounter was 40 minutes and more than 50% was on counseling and review of test results   Henreitta Leber, MD Medical Director of Neuro-Oncology [facility] 07/08/23 12:28 PM

## 2023-07-08 NOTE — Telephone Encounter (Signed)
Oral Oncology Pharmacist Encounter  Received new prescription for Temodar (temozolomide) for the treatment of glioblastoma, planned duration 6-12 cycles.  CBC w/ Diff and CMP from 06/10/23 assessed, noted Scr of 1.24 mg/dL (CrCl ~16 mL/min) - no renal dose adjustments required for TMZ. Prescription dose and frequency assessed for appropriateness.  Current medication list in Epic reviewed, no relevant/significant DDIs with Temodar identified.  Evaluated chart and no patient barriers to medication adherence noted.   Patient agreement for treatment documented in MD note on 07/08/23.  Prescription has been e-scribed to the [facility] for benefits analysis and approval.  Oral Oncology Clinic will continue to follow for insurance authorization, copayment issues, initial counseling and start date.  Sherry Ruffing, PharmD, BCPS, BCOP Hematology/Oncology Clinical Pharmacist [facility] Oral Chemotherapy Navigation Clinics [phone] 07/08/2023 12:48 PM

## 2023-07-09 ENCOUNTER — Other Ambulatory Visit: Payer: Self-pay

## 2023-07-09 ENCOUNTER — Ambulatory Visit
Admission: RE | Admit: 2023-07-09 | Discharge: 2023-07-09 | Disposition: A | Discharge status: Home / Self Care | Payer: Medicare Other | Source: Ambulatory Visit | Attending: Radiation Oncology | Admitting: Radiation Oncology

## 2023-07-09 DIAGNOSIS — C712 Malignant neoplasm of temporal lobe: Secondary | ICD-10-CM | POA: Insufficient documentation

## 2023-07-09 DIAGNOSIS — Z510 Encounter for antineoplastic radiation therapy: Secondary | ICD-10-CM | POA: Insufficient documentation

## 2023-07-09 NOTE — Progress Notes (Signed)
  Radiation Oncology         [phone]) [phone] ________________________________  Name: Juan Garrett MRN: [id number]  Date of Service: 07/09/2023  DOB: [birth year]  Post Treatment Telephone Note  Diagnosis:  D49.6 Neoplasm of unspecified behavior of brain (as documented in provider EOT note)  The patient was not available for call today. Voicemail left.  The patient was counseled that he  will be contacted by our brain and spine navigator to schedule surveillance imaging. The patient was encouraged to call if he  have not received a call to schedule imaging, or if he  develops concerns or questions regarding radiation. The patient will also continue to follow up with Dr. Barbaraann Cao in medical oncology.    Ruel Favors, LPN

## 2023-07-10 ENCOUNTER — Other Ambulatory Visit: Payer: Self-pay | Admitting: Pharmacy Technician

## 2023-07-10 ENCOUNTER — Other Ambulatory Visit (HOSPITAL_COMMUNITY): Payer: Self-pay

## 2023-07-10 ENCOUNTER — Ambulatory Visit: Payer: Medicare Other | Admitting: Physical Therapy

## 2023-07-10 ENCOUNTER — Other Ambulatory Visit: Payer: Self-pay

## 2023-07-10 ENCOUNTER — Encounter: Payer: Self-pay | Admitting: Internal Medicine

## 2023-07-10 MED ORDER — TEMOZOLOMIDE 140 MG PO CAPS
150.0000 mg/m2/d | ORAL_CAPSULE | Freq: Every day | ORAL | 0 refills | Status: DC
  Filled 2023-07-10: qty 10, 28d supply, fill #0

## 2023-07-10 NOTE — Telephone Encounter (Signed)
Oral Chemotherapy Pharmacist Encounter  I spoke with patient for overview of: Temodar (temozolomide) for the maintenance treatment of glioblastoma multiforme, planned duration 6-12 months of treatment.  Counseled on administration, dosing, side effects, monitoring, drug-food interactions, safe handling, storage, and disposal.  Patient will take Temodar 140mg  capsules, 2 capsules, 280mg  total daily dose, by mouth once daily, may take at bedtime and on an empty stomach to decrease nausea and vomiting.  If 1st cycle is well tolerated, patient informed that Temodar dose may be increased to 200 mg/m2 daily for 5 days on, 23 days off, repeated every 28 days for subsequent cycles   Patient will take Temodar daily for 5 days on, 23 days off, and repeated.  Temodar start date: 07/11/23 PM   Adverse effects include but are not limited to: nausea, vomiting, anorexia, GI upset, rash, and fatigue. Nausea/Vomiting PPX: patient will take ondansetron 8 mg tablet 30-60 minutes prior to Temodar dosing for the 5 nights to help decrease nausea and vomiting. He is aware he can also take the ondansetron q8h PRN N/V if needed as well.   Reviewed importance of keeping a medication schedule and plan for any missed doses. No barriers to medication adherence identified.  Medication reconciliation performed and medication/allergy list updated.  All questions answered.  Juan Garrett voiced understanding and appreciation.   Medication education handout placed in mail for patient. Patient knows to call the office with questions or concerns. Oral Chemotherapy Clinic phone number provided to patient.   Sherry Ruffing, PharmD, BCPS, BCOP Hematology/Oncology Clinical Pharmacist [facility] Oral Chemotherapy Navigation Clinics [phone] 07/10/2023 1:13 PM

## 2023-07-10 NOTE — Progress Notes (Signed)
Specialty Pharmacy Initial Fill Coordination Note  Juan Garrett is a 73 y.o. male contacted today regarding refills of specialty medication(s) Temozolomide (TEMODAR) .  Patient requested [name] at [facility] at [place]  on 07/11/23   Medication will be filled on 1/29/2.   Patient is aware of $19.64 copayment.

## 2023-07-10 NOTE — Progress Notes (Signed)
Oral Chemotherapy Pharmacist Encounter  Patient was counseled under telephone encounter from 07/08/23.  Sherry Ruffing, PharmD, BCPS, BCOP Hematology/Oncology Clinical Pharmacist [facility] Oral Chemotherapy Navigation Clinics [phone] 07/10/2023 1:17 PM

## 2023-07-11 ENCOUNTER — Other Ambulatory Visit (HOSPITAL_COMMUNITY): Payer: Self-pay

## 2023-07-11 ENCOUNTER — Ambulatory Visit: Payer: Medicare Other | Attending: Internal Medicine | Admitting: Physical Therapy

## 2023-07-11 ENCOUNTER — Encounter: Payer: Self-pay | Admitting: Physical Therapy

## 2023-07-11 DIAGNOSIS — R2689 Other abnormalities of gait and mobility: Secondary | ICD-10-CM | POA: Diagnosis present

## 2023-07-11 DIAGNOSIS — M6281 Muscle weakness (generalized): Secondary | ICD-10-CM | POA: Diagnosis present

## 2023-07-11 DIAGNOSIS — R2681 Unsteadiness on feet: Secondary | ICD-10-CM | POA: Insufficient documentation

## 2023-07-11 NOTE — Therapy (Signed)
OUTPATIENT PHYSICAL THERAPY NEURO EVALUATION   Patient Name: Juan Garrett MRN: [id number] DOB:June 19, 1950, 73 y.o., male Today's Date: 07/11/2023   PCP: Georgann Housekeeper, MD REFERRING PROVIDER: Henreitta Leber, MD  END OF SESSION:  PT End of Session - 07/11/23 [phone]     Visit Number 1    Number of Visits 5   Plus eval   Date for PT Re-Evaluation 08/22/23    Authorization Type Medicare    PT Start Time 0804    PT Stop Time 0838    PT Time Calculation (min) 34 min    Activity Tolerance Patient tolerated treatment well    Behavior During Therapy [facility] for tasks assessed/performed             Past Medical History:  Diagnosis Date   Allergy    seasonal allergies   Arthritis    bilateral knees   Cataract    not a surgical candidate at this time (07/06/2020)   GERD (gastroesophageal reflux disease)    on meds   Hypertension    on meds   Past Surgical History:  Procedure Laterality Date   APPLICATION OF CRANIAL NAVIGATION N/A 01/31/2023   Procedure: APPLICATION OF CRANIAL NAVIGATION;  Surgeon: Tressie Stalker, MD;  Location: [facility] OR;  Service: Neurosurgery;  Laterality: N/A;   CARPAL TUNNEL RELEASE Bilateral 1997   CHONDROPLASTY Right 09/23/2019   Procedure: CHONDROPLASTY;  Surgeon: Tarry Kos, MD;  Location: [facility];  Service: Orthopedics;  Laterality: Right;   CRANIOTOMY Right 01/31/2023   Procedure: CRANIOTOMY TUMOR EXCISION;  Surgeon: Tressie Stalker, MD;  Location[facility] OR;  Service: Neurosurgery;  Laterality: Right;   KNEE ARTHROSCOPY WITH MEDIAL MENISECTOMY Right 09/23/2019   Procedure: RIGHT KNEE ARTHROSCOPY WITH PARTIAL MEDIAL MENISCECTOMY;  Surgeon: Tarry Kos, MD;  Location: [facility];  Service: Orthopedics;  Laterality: Right;   KNEE SURGERY Left 1992   Patient Active Problem List   Diagnosis Date Noted   Malignant neoplasm of temporal lobe of brain (HCC) 03/04/2023   Glioblastoma, IDH-wildtype (HCC) 01/30/2023    Effusion, right knee 10/28/2019   Acute medial meniscal tear, right, initial encounter 09/04/2019    ONSET DATE: 07/08/2023 (referral)   REFERRING DIAG: C71.9 (ICD-10-CM) - Glioblastoma, IDH-wildtype (HCC)  THERAPY DIAG:  Unsteadiness on feet  Other abnormalities of gait and mobility  Rationale for Evaluation and Treatment: Rehabilitation  SUBJECTIVE:  SUBJECTIVE STATEMENT: "Juan Garrett"   Pt presents without AD. States he is doing pretty well balance-wise, feels off when he turns too quickly. 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 Has had a cough for about 2 weeks, is going to see his PCP about this. Gets dizzy if he looks up, has a history of dizziness but nothing severe. Has had two brain surgeries: August 5th and October 23rd.   Pt accompanied by: self  PERTINENT HISTORY: right temporal glioblastoma  PAIN:  Are you having pain? No pt reports occasional knee and neck pain, inconsistent    PRECAUTIONS: Fall  RED FLAGS: None   WEIGHT BEARING RESTRICTIONS: No  FALLS: Has patient fallen in last 6 months? No  LIVING ENVIRONMENT: Lives with: lives with their spouse Lives in: House/apartment Stairs: No Has following equipment at home: Single point cane, Walker - 2 wheeled, shower chair, bed side commode, and Grab bars  PLOF: Independent  PATIENT GOALS: "I would like to get some of my strength back and make sure my balance stays good"   OBJECTIVE:  Note: Objective measures were completed at Evaluation unless otherwise noted.  DIAGNOSTIC FINDINGS: MRI of brain from 06/25/23  IMPRESSION: 1. Previous right lateral craniotomy for resection of a lateral right temporal lobe mass. Post resection space does not show any complicating features. The  margins of the post resection space show only thin enhancement, with the single exception of the medial roof which shows a 3 mm focus of slightly thicker enhancement that can be followed on subsequent exams. Mild T2 and FLAIR signal emanates outward from the roof of the resection within the white matter. 2. Right mastoid effusion.  COGNITION: Overall cognitive status: Within functional limits for tasks assessed   SENSATION: Bilateral carpal tunnel   POSTURE: No Significant postural limitations  LOWER EXTREMITY ROM:     Active  Right Eval Left Eval  Hip flexion    Hip extension    Hip abduction    Hip adduction    Hip internal rotation    Hip external rotation    Knee flexion    Knee extension    Ankle dorsiflexion    Ankle plantarflexion    Ankle inversion    Ankle eversion     (Blank rows = not tested)  LOWER EXTREMITY MMT:  Tested in seated position   MMT Right Eval Left Eval  Hip flexion 5 5  Hip extension    Hip abduction 5 5  Hip adduction 5 5  Hip internal rotation    Hip external rotation    Knee flexion 5 5  Knee extension 5 5  Ankle dorsiflexion 5 5  Ankle plantarflexion    Ankle inversion    Ankle eversion    (Blank rows = not tested)  BED MOBILITY:  Independent per pt   TRANSFERS: Assistive device utilized: None  Sit to stand: Complete Independence Stand to sit: Complete Independence   STAIRS: Level of Assistance: Modified independence Stair Negotiation Technique: Alternating Pattern  with Bilateral Rails Number of Stairs: 4  Height of Stairs: 6"  Comments: Reduced speed, no instability noted   GAIT: Gait pattern:  Minor lateral gait deviations, step through pattern, scissoring, and narrow BOS Distance walked: Various clinic distances  Assistive device utilized: None Level of assistance: Modified independence Comments: Noted occasional scissoring of RLE, occasional deviation to L side   FUNCTIONAL TESTS:  MCTSIB: Condition 1:  Avg of 3 trials: 30  sec, Condition 2: Avg of 3 trials: 30 sec, Condition 3: Avg of 3 trials: 30 (minor A/P away) sec, Condition 4: Avg of 3 trials: 30 (minor A/P sway) sec, and Total Score: 120/120   [facility] PT Assessment - 07/11/23 0819       Transfers   Five time sit to stand comments  10.75s   No UE support     Ambulation/Gait   Gait velocity 40m over 9.75s = 1.03 m/s no AD      Functional Gait  Assessment   Gait assessed  Yes    Gait Level Surface Walks 20 ft in less than 5.5 sec, no assistive devices, good speed, no evidence for imbalance, normal gait pattern, deviates no more than 6 in outside of the 12 in walkway width.   5.25s   Change in Gait Speed Able to smoothly change walking speed without loss of balance or gait deviation. Deviate no more than 6 in outside of the 12 in walkway width.    Gait with Horizontal Head Turns Performs head turns smoothly with no change in gait. Deviates no more than 6 in outside 12 in walkway width    Gait with Vertical Head Turns Performs task with slight change in gait velocity (eg, minor disruption to smooth gait path), deviates 6 - 10 in outside 12 in walkway width or uses assistive device    Gait and Pivot Turn Pivot turns safely within 3 sec and stops quickly with no loss of balance.    Step Over Obstacle Is able to step over 2 stacked shoe boxes taped together (9 in total height) without changing gait speed. No evidence of imbalance.    Gait with Narrow Base of Support Is able to ambulate for 10 steps heel to toe with no staggering.    Gait with Eyes Closed Walks 20 ft, no assistive devices, good speed, no evidence of imbalance, normal gait pattern, deviates no more than 6 in outside 12 in walkway width. Ambulates 20 ft in less than 7 sec.   6.66s   Ambulating Backwards Walks 20 ft, no assistive devices, good speed, no evidence for imbalance, normal gait    Steps Alternating feet, must use rail.    Total Score 28    FGA comment: Low fall risk                                                                                                                                          TREATMENT DATE: N/A eval     PATIENT EDUCATION: Education details: POC, eval findings  Person educated: Patient Education method: Medical illustrator Education comprehension: verbalized understanding  HOME EXERCISE PROGRAM: To be established   GOALS: Goals reviewed with patient? Yes  STG = LTG DUE TO POC LENGTH    LONG TERM GOALS: Target date: 08/08/2023   Pt will be independent with gym-based HEP and  return to gym for improved strength, balance and independence   Baseline: not established on eval  Goal status: INITIAL   ASSESSMENT:  CLINICAL IMPRESSION: Patient is a 73 year old male referred to Neuro OPPT for glioblastoma. Pt's PMH is significant for: HTN and right temporal glioblastoma. The following deficits were present during the exam: dizziness, impaired balance and decreased activity tolerance. Pt is a low fall risk per his score on FGA, gait speed and no history of falls, but reports he would like to work to improve his strength and maintain his balance. Pt would benefit from skilled PT to address these impairments and functional limitations to maximize functional mobility independence.    OBJECTIVE IMPAIRMENTS: Abnormal gait, decreased activity tolerance, decreased balance, dizziness, and pain  ACTIVITY LIMITATIONS: standing and locomotion level  PARTICIPATION LIMITATIONS: driving, shopping, community activity, occupation, and yard work  PERSONAL FACTORS: Age, Fitness, and 1 comorbidity: glioblastoma  are also affecting patient's functional outcome.   REHAB POTENTIAL: Good  CLINICAL DECISION MAKING: Stable/uncomplicated  EVALUATION COMPLEXITY: Low  PLAN:  PT FREQUENCY: 1x/week  PT DURATION: 4 weeks  PLANNED INTERVENTIONS: 97164- PT Re-evaluation, 97110-Therapeutic exercises, 97530- Therapeutic activity, 97112-  Neuromuscular re-education, 97535- Self Care, 14782- Manual therapy, [phone]- Gait training, [phone]- Canalith repositioning, [phone]- Aquatic Therapy, Patient/Family education, Balance training, Dry Needling, Joint mobilization, and Vestibular training  PLAN FOR NEXT SESSION: Work on return to gym - lifting, functional strength, high level coordination tasks.    Jill Alexanders Joakim Huesman, PT, DPT 07/11/2023, 8:40 AM

## 2023-07-12 ENCOUNTER — Other Ambulatory Visit: Payer: Self-pay

## 2023-07-17 ENCOUNTER — Ambulatory Visit: Payer: Medicare Other | Attending: Internal Medicine | Admitting: Physical Therapy

## 2023-07-17 DIAGNOSIS — R2689 Other abnormalities of gait and mobility: Secondary | ICD-10-CM | POA: Diagnosis present

## 2023-07-17 DIAGNOSIS — R2681 Unsteadiness on feet: Secondary | ICD-10-CM | POA: Diagnosis present

## 2023-07-17 DIAGNOSIS — M6281 Muscle weakness (generalized): Secondary | ICD-10-CM | POA: Insufficient documentation

## 2023-07-17 NOTE — Therapy (Signed)
 OUTPATIENT PHYSICAL THERAPY NEURO TREATMENT   Patient Name: Juan Garrett MRN: [id number] DOB:1951/02/10, 73 y.o., male Today's Date: 07/17/2023   PCP: Juan Other, MD REFERRING PROVIDER: Vaslow, Zachary K, MD  END OF SESSION:  PT End of Session - 07/17/23 0844     Visit Number 2    Number of Visits 5   Plus eval   Date for PT Re-Evaluation 08/22/23    Authorization Type Medicare    PT Start Time 0843    PT Stop Time 0928    PT Time Calculation (min) 45 min    Equipment Utilized During Treatment Gait belt    Activity Tolerance Patient tolerated treatment well    Behavior During Therapy [facility] for tasks assessed/performed              Past Medical History:  Diagnosis Date   Allergy    seasonal allergies   Arthritis    bilateral knees   Cataract    not a surgical candidate at this time (07/06/2020)   GERD (gastroesophageal reflux disease)    on meds   Hypertension    on meds   Past Surgical History:  Procedure Laterality Date   APPLICATION OF CRANIAL NAVIGATION N/A 01/31/2023   Procedure: APPLICATION OF CRANIAL NAVIGATION;  Surgeon: Juan Purchase, MD;  Location: [facility] OR;  Service: Neurosurgery;  Laterality: N/A;   CARPAL TUNNEL RELEASE Bilateral 1997   CHONDROPLASTY Right 09/23/2019   Procedure: CHONDROPLASTY;  Surgeon: Juan Kay HERO, MD;  Location: [facility];  Service: Orthopedics;  Laterality: Right;   CRANIOTOMY Right 01/31/2023   Procedure: CRANIOTOMY TUMOR EXCISION;  Surgeon: Juan Purchase, MD;  Location: [facility] OR;  Service: Neurosurgery;  Laterality: Right;   KNEE ARTHROSCOPY WITH MEDIAL MENISECTOMY Right 09/23/2019   Procedure: RIGHT KNEE ARTHROSCOPY WITH PARTIAL MEDIAL MENISCECTOMY;  Surgeon: Juan Kay HERO, MD;  Location: [facility];  Service: Orthopedics;  Laterality: Right;   KNEE SURGERY Left 1992   Patient Active Problem List   Diagnosis Date Noted   Malignant neoplasm of temporal lobe of brain (HCC) 03/04/2023    Glioblastoma, IDH-wildtype (HCC) 01/30/2023   Effusion, right knee 10/28/2019   Acute medial meniscal tear, right, initial encounter 09/04/2019    ONSET DATE: 07/08/2023 (referral)   REFERRING DIAG: C71.9 (ICD-10-CM) - Glioblastoma, IDH-wildtype (HCC)  THERAPY DIAG:  Unsteadiness on feet  Garrett abnormalities of gait and mobility  Muscle weakness (generalized)  Rationale for Evaluation and Treatment: Rehabilitation  SUBJECTIVE:  SUBJECTIVE STATEMENT: [name]   Pt reports he had the flu since he was here last, had very mild symptoms and took Tamiflu, feeling better now but does still have a cough. Pt denies any falls since last visit and any pain today.  Pt enjoyed lifting weights, doing resistance exercises at the gym, wants to work on his balance. Gets bored sitting on exercise bike.  Pt accompanied by: self  PERTINENT HISTORY: right temporal glioblastoma  PAIN:  Are you having pain? No pt reports occasional knee and neck pain, inconsistent    PRECAUTIONS: Fall  RED FLAGS: None   WEIGHT BEARING RESTRICTIONS: No  FALLS: Has patient fallen in last 6 months? No  LIVING ENVIRONMENT: Lives with: lives with their spouse Lives in: House/apartment Stairs: No Has following equipment at home: Single point cane, Walker - 2 wheeled, shower chair, bed side commode, and Grab bars  PLOF: Independent  PATIENT GOALS: I would like to get some of my strength back and make sure my balance stays good   OBJECTIVE:  Note: Objective measures were completed at Evaluation unless otherwise noted.  DIAGNOSTIC FINDINGS: MRI of brain from 06/25/23  IMPRESSION: 1. Previous right lateral craniotomy for resection of a lateral right temporal lobe mass. Post resection space does not show  any complicating features. The margins of the post resection space show only thin enhancement, with the single exception of the medial roof which shows a 3 mm focus of slightly thicker enhancement that can be followed on subsequent exams. Mild T2 and FLAIR signal emanates outward from the roof of the resection within the white matter. 2. Right mastoid effusion.  COGNITION: Overall cognitive status: Within functional limits for tasks assessed   SENSATION: Bilateral carpal tunnel   POSTURE: No Significant postural limitations  LOWER EXTREMITY ROM:     Active  Right Eval Left Eval  Hip flexion    Hip extension    Hip abduction    Hip adduction    Hip internal rotation    Hip external rotation    Knee flexion    Knee extension    Ankle dorsiflexion    Ankle plantarflexion    Ankle inversion    Ankle eversion     (Blank rows = not tested)  LOWER EXTREMITY MMT:  Tested in seated position   MMT Right Eval Left Eval  Hip flexion 5 5  Hip extension    Hip abduction 5 5  Hip adduction 5 5  Hip internal rotation    Hip external rotation    Knee flexion 5 5  Knee extension 5 5  Ankle dorsiflexion 5 5  Ankle plantarflexion    Ankle inversion    Ankle eversion    (Blank rows = not tested)  BED MOBILITY:  Independent per pt   TRANSFERS: Assistive device utilized: None  Sit to stand: Complete Independence Stand to sit: Complete Independence   STAIRS: Level of Assistance: Modified independence Stair Negotiation Technique: Alternating Pattern  with Bilateral Rails Number of Stairs: 4  Height of Stairs: 6  Comments: Reduced speed, no instability noted   GAIT: Gait pattern:  Minor lateral gait deviations, step through pattern, scissoring, and narrow BOS Distance walked: Various clinic distances  Assistive device utilized: None Level of assistance: Modified independence Comments: Noted occasional scissoring of RLE, occasional deviation to L side   FUNCTIONAL  TESTS:  MCTSIB: Condition 1: Avg of 3 trials: 30 sec, Condition 2: Avg of 3 trials: 30 sec, Condition 3: Avg of 3  trials: 30 (minor A/P away) sec, Condition 4: Avg of 3 trials: 30 (minor A/P sway) sec, and Total Score: 120/120                                                                                                                                 TREATMENT: TherEx In half kneeling on red mat on floor with use of 6# weighted ball: Chest press x 10 reps on R/L LE OH press x 10 reps on R/L LE L/R diagonals x 10 reps on R/L LE Pt does have onset of L HS cramping in half-kneel position so deferred further repetitions of exercises in this position  To work on static balance and use of ankle strategy: Static stance on Bosu in // bars with no UE support and SBA Stance x 30 sec no UE support Chest press 2 x 10 reps with unweighted ball OH press 2 x 10 reps with unweighted ball  To work on functional LE strengthening and dynamic balance: Forwards lunges with gait 4 x 10 ft, intermittent UE support on wall Mild knee pain with exercise, crepitus  Farmers carry with 12# KB 2 x 115 ft carrying in each UE  To work on static and dynamic balance on a compliant surface (airex) with variable BOS and CGA to min A: Rebounder ball toss with 2 kg weighted ball Normal stance x 10 reps Romberg stance x 10 reps L/R narrow tandem stance 2 x 10 reps B One LOB to the R, able to utilize stepping out strategy to recover balance Forward step onto airex x 10 reps B   PATIENT EDUCATION: Education details: initial HEP and PT POC to build on HEP as well as discuss fitness routine at gym Person educated: Patient Education method: Programmer, Multimedia, Facilities Manager, and Handouts Education comprehension: verbalized understanding and returned demonstration  HOME EXERCISE PROGRAM: Access Code: [email or web address] URL: [email or web address] Date: 07/17/2023 Prepared by: Waddell Southgate  Exercises - Standing  Tandem Balance with Counter Support  - 1 x daily - 7 x weekly - 1 sets - 3-5 reps - 30-45 sec hold - Standard Lunge  - 1 x daily - 7 x weekly - 3 sets - 10 reps  GOALS: Goals reviewed with patient? Yes  STG = LTG DUE TO POC LENGTH    LONG TERM GOALS: Target date: 08/08/2023   Pt will be independent with gym-based HEP and return to gym for improved strength, balance and independence   Baseline: not established on eval  Goal status: INITIAL   ASSESSMENT:  CLINICAL IMPRESSION: Emphasis of skilled PT session on trialing various exercises to work on functional strengthening and static and dynamic balance in order to work towards building patient an HEP. Pt with onset of L HS cramping in half-kneel position and some knee pain with lunges. Pt most challenged by balance on Bosu and in narrow tandem stance. Pt continues to benefit from skilled PT services to work towards building  an HEP and being independent with performance of HEP. Continue POC.     OBJECTIVE IMPAIRMENTS: Abnormal gait, decreased activity tolerance, decreased balance, dizziness, and pain  ACTIVITY LIMITATIONS: standing and locomotion level  PARTICIPATION LIMITATIONS: driving, shopping, community activity, occupation, and yard work  PERSONAL FACTORS: Age, Fitness, and 1 comorbidity: glioblastoma  are also affecting patient's functional outcome.   REHAB POTENTIAL: Good  CLINICAL DECISION MAKING: Stable/uncomplicated  EVALUATION COMPLEXITY: Low  PLAN:  PT FREQUENCY: 1x/week  PT DURATION: 4 weeks  PLANNED INTERVENTIONS: 97164- PT Re-evaluation, 97110-Therapeutic exercises, 97530- Therapeutic activity, 97112- Neuromuscular re-education, 97535- Self Care, 02859- Manual therapy, [phone]- Gait training, [phone]- Canalith repositioning, [phone]- Aquatic Therapy, Patient/Family education, Balance training, Dry Needling, Joint mobilization, and Vestibular training  PLAN FOR NEXT SESSION: Work on return to gym - lifting,  functional strength, high level coordination tasks.    Chas Axel, PT Waddell Southgate, PT, DPT, CSRS  07/17/2023, 9:28 AM

## 2023-07-18 ENCOUNTER — Other Ambulatory Visit: Payer: Self-pay

## 2023-07-24 ENCOUNTER — Ambulatory Visit: Payer: Medicare Other | Admitting: Physical Therapy

## 2023-07-24 VITALS — BP 123/71 | HR 73

## 2023-07-24 DIAGNOSIS — R2689 Other abnormalities of gait and mobility: Secondary | ICD-10-CM

## 2023-07-24 DIAGNOSIS — M6281 Muscle weakness (generalized): Secondary | ICD-10-CM

## 2023-07-24 DIAGNOSIS — R2681 Unsteadiness on feet: Secondary | ICD-10-CM

## 2023-07-24 NOTE — Therapy (Signed)
OUTPATIENT PHYSICAL THERAPY NEURO TREATMENT   Patient Name: Juan Garrett MRN: [id number] DOB:1951-04-28, 73 y.o., male Today's Date: 07/24/2023   PCP: Georgann Housekeeper, MD REFERRING PROVIDER: Henreitta Leber, MD  END OF SESSION:  PT End of Session - 07/24/23 0803     Visit Number 3    Number of Visits 5   Plus eval   Date for PT Re-Evaluation 08/22/23    Authorization Type Medicare    PT Start Time 0802    PT Stop Time 0840    PT Time Calculation (min) 38 min    Equipment Utilized During Treatment --    Activity Tolerance Patient tolerated treatment well    Behavior During Therapy [facility] for tasks assessed/performed               Past Medical History:  Diagnosis Date   Allergy    seasonal allergies   Arthritis    bilateral knees   Cataract    not a surgical candidate at this time (07/06/2020)   GERD (gastroesophageal reflux disease)    on meds   Hypertension    on meds   Past Surgical History:  Procedure Laterality Date   APPLICATION OF CRANIAL NAVIGATION N/A 01/31/2023   Procedure: APPLICATION OF CRANIAL NAVIGATION;  Surgeon: Tressie Stalker, MD;  Location: [facility] OR;  Service: Neurosurgery;  Laterality: N/A;   CARPAL TUNNEL RELEASE Bilateral 1997   CHONDROPLASTY Right 09/23/2019   Procedure: CHONDROPLASTY;  Surgeon: Tarry Kos, MD;  Location: [facility];  Service: Orthopedics;  Laterality: Right;   CRANIOTOMY Right 01/31/2023   Procedure: CRANIOTOMY TUMOR EXCISION;  Surgeon: Tressie Stalker, MD;  Location: [facility] OR;  Service: Neurosurgery;  Laterality: Right;   KNEE ARTHROSCOPY WITH MEDIAL MENISECTOMY Right 09/23/2019   Procedure: RIGHT KNEE ARTHROSCOPY WITH PARTIAL MEDIAL MENISCECTOMY;  Surgeon: Tarry Kos, MD;  Location: [facility];  Service: Orthopedics;  Laterality: Right;   KNEE SURGERY Left 1992   Patient Active Problem List   Diagnosis Date Noted   Malignant neoplasm of temporal lobe of brain (HCC) 03/04/2023    Glioblastoma, IDH-wildtype (HCC) 01/30/2023   Effusion, right knee 10/28/2019   Acute medial meniscal tear, right, initial encounter 09/04/2019    ONSET DATE: 07/08/2023 (referral)   REFERRING DIAG: C71.9 (ICD-10-CM) - Glioblastoma, IDH-wildtype (HCC)  THERAPY DIAG:  Unsteadiness on feet  Muscle weakness (generalized)  Other abnormalities of gait and mobility  Rationale for Evaluation and Treatment: Rehabilitation  SUBJECTIVE:  SUBJECTIVE STATEMENT: "Juan Garrett"   Pt reports he is dizzy today, rating dizziness as 3/10. Did not check his BP at home. Wakes up dizzy sometimes. Denies falls.   Pt accompanied by: self  PERTINENT HISTORY: right temporal glioblastoma  PAIN:  Are you having pain? No pt reports occasional knee and neck pain, inconsistent    PRECAUTIONS: Fall  RED FLAGS: None   WEIGHT BEARING RESTRICTIONS: No  FALLS: Has patient fallen in last 6 months? No  LIVING ENVIRONMENT: Lives with: lives with their spouse Lives in: House/apartment Stairs: No Has following equipment at home: Single point cane, Walker - 2 wheeled, shower chair, bed side commode, and Grab bars  PLOF: Independent  PATIENT GOALS: "I would like to get some of my strength back and make sure my balance stays good"   OBJECTIVE:  Note: Objective measures were completed at Evaluation unless otherwise noted.  DIAGNOSTIC FINDINGS: MRI of brain from 06/25/23  IMPRESSION: 1. Previous right lateral craniotomy for resection of a lateral right temporal lobe mass. Post resection space does not show any complicating features. The margins of the post resection space show only thin enhancement, with the single exception of the medial roof which shows a 3 mm focus of slightly thicker enhancement that can be followed on  subsequent exams. Mild T2 and FLAIR signal emanates outward from the roof of the resection within the white matter. 2. Right mastoid effusion.  COGNITION: Overall cognitive status: Within functional limits for tasks assessed   SENSATION: Bilateral carpal tunnel   POSTURE: No Significant postural limitations  LOWER EXTREMITY ROM:     Active  Right Eval Left Eval  Hip flexion    Hip extension    Hip abduction    Hip adduction    Hip internal rotation    Hip external rotation    Knee flexion    Knee extension    Ankle dorsiflexion    Ankle plantarflexion    Ankle inversion    Ankle eversion     (Blank rows = not tested)  LOWER EXTREMITY MMT:  Tested in seated position   MMT Right Eval Left Eval  Hip flexion 5 5  Hip extension    Hip abduction 5 5  Hip adduction 5 5  Hip internal rotation    Hip external rotation    Knee flexion 5 5  Knee extension 5 5  Ankle dorsiflexion 5 5  Ankle plantarflexion    Ankle inversion    Ankle eversion    (Blank rows = not tested)  BED MOBILITY:  Independent per pt   TRANSFERS: Assistive device utilized: None  Sit to stand: Complete Independence Stand to sit: Complete Independence   STAIRS: Level of Assistance: Modified independence Stair Negotiation Technique: Alternating Pattern  with Bilateral Rails Number of Stairs: 4  Height of Stairs: 6"  Comments: Reduced speed, no instability noted   GAIT: Gait pattern:  Minor lateral gait deviations, step through pattern, scissoring, and narrow BOS Distance walked: Various clinic distances  Assistive device utilized: None Level of assistance: Modified independence Comments: Noted occasional scissoring of RLE, occasional deviation to L side   FUNCTIONAL TESTS:  MCTSIB: Condition 1: Avg of 3 trials: 30 sec, Condition 2: Avg of 3 trials: 30 sec, Condition 3: Avg of 3 trials: 30 (minor A/P away) sec, Condition 4: Avg of 3 trials: 30 (minor A/P sway) sec, and Total Score:  120/120    VITALS  Vitals:   07/24/23 0805  BP: 123/71  Pulse: 73  TREATMENT: Self-care Assessed BP (see above) and WNL   Ther Act The following treadmill training was completed for aerobic/neural priming, endurance, and gait speed.  - Warmup: 2:00 up to 2.1 mph - Incline training: 6:00 at 2.1 mph increasing incline by 1% every minute  until reaching 6% -Cool down: 1 minute at 2.1 mph - Noted increased forward flexed posture w/fatigue   Thrusters w/6# DB, x15 reps, for improved high amplitude movement, functional BLE strength and core stability. Min cues for wider BOS and to maintain weight on heels. Added to HEP (see bolded below)  B-stance RDLs w/15# KB, x12 reps per side, for improved posterior chain strength and modified single leg stability. CGA throughout.  115' slam ball lunges w/8# ball for improved endurance, high amplitude movement and LE coordination. CGA throughout but no LOB noted.    PATIENT EDUCATION: Education details: Additions to HEP Person educated: Patient Education method: Explanation, Demonstration, and Handouts Education comprehension: verbalized understanding and returned demonstration  HOME EXERCISE PROGRAM: Access Code: [email or web address] URL: [email or web address] Date: 07/17/2023 Prepared by: Peter Congo  Exercises - Standing Tandem Balance with Counter Support  - 1 x daily - 7 x weekly - 1 sets - 3-5 reps - 30-45 sec hold - Standard Lunge  - 1 x daily - 7 x weekly - 3 sets - 10 reps - Dumbbell Thruster   - 1 x daily - 7 x weekly - 3 sets - 10 reps  GOALS: Goals reviewed with patient? Yes  STG = LTG DUE TO POC LENGTH    LONG TERM GOALS: Target date: 08/08/2023   Pt will be independent with gym-based HEP and return to gym for improved strength, balance and independence   Baseline: not established on eval   Goal status: INITIAL   ASSESSMENT:  CLINICAL IMPRESSION: Emphasis of skilled PT session on endurance, anticipatory balance strategies and functional strength training. Pt reported dizziness at beginning of session but vitals WNL and pt reported ability to continue with session. Pt tolerated session well and was most challenged by modified single leg stance and high amplitude movements. Continue POC.     OBJECTIVE IMPAIRMENTS: Abnormal gait, decreased activity tolerance, decreased balance, dizziness, and pain  ACTIVITY LIMITATIONS: standing and locomotion level  PARTICIPATION LIMITATIONS: driving, shopping, community activity, occupation, and yard work  PERSONAL FACTORS: Age, Fitness, and 1 comorbidity: glioblastoma  are also affecting patient's functional outcome.   REHAB POTENTIAL: Good  CLINICAL DECISION MAKING: Stable/uncomplicated  EVALUATION COMPLEXITY: Low  PLAN:  PT FREQUENCY: 1x/week  PT DURATION: 4 weeks  PLANNED INTERVENTIONS: 97164- PT Re-evaluation, 97110-Therapeutic exercises, 97530- Therapeutic activity, 97112- Neuromuscular re-education, 97535- Self Care, 14782- Manual therapy, [phone]- Gait training, [phone]- Canalith repositioning, [phone]- Aquatic Therapy, Patient/Family education, Balance training, Dry Needling, Joint mobilization, and Vestibular training  PLAN FOR NEXT SESSION: Work on return to gym - lifting, functional strength, high level coordination tasks. Single leg blaze pods, agility drills    Jill Alexanders Yamileth Hayse, PT, DPT  07/24/2023, 9:36 AM

## 2023-07-31 ENCOUNTER — Ambulatory Visit: Payer: Medicare Other | Admitting: Physical Therapy

## 2023-07-31 VITALS — BP 122/70 | HR 68

## 2023-07-31 DIAGNOSIS — R2689 Other abnormalities of gait and mobility: Secondary | ICD-10-CM

## 2023-07-31 DIAGNOSIS — M6281 Muscle weakness (generalized): Secondary | ICD-10-CM

## 2023-07-31 DIAGNOSIS — R2681 Unsteadiness on feet: Secondary | ICD-10-CM

## 2023-07-31 NOTE — Therapy (Signed)
OUTPATIENT PHYSICAL THERAPY NEURO TREATMENT   Patient Name: Juan Garrett MRN: [id number] DOB:25-Jun-1950, 73 y.o., male Today's Date: 07/31/2023   PCP: Georgann Housekeeper, MD REFERRING PROVIDER: Henreitta Leber, MD  END OF SESSION:  PT End of Session - 07/31/23 [phone]     Visit Number 4    Number of Visits 5   Plus eval   Date for PT Re-Evaluation 08/22/23    Authorization Type Medicare    PT Start Time 0838    PT Stop Time 0925    PT Time Calculation (min) 47 min    Activity Tolerance Patient tolerated treatment well    Behavior During Therapy [facility] for tasks assessed/performed                Past Medical History:  Diagnosis Date   Allergy    seasonal allergies   Arthritis    bilateral knees   Cataract    not a surgical candidate at this time (07/06/2020)   GERD (gastroesophageal reflux disease)    on meds   Hypertension    on meds   Past Surgical History:  Procedure Laterality Date   APPLICATION OF CRANIAL NAVIGATION N/A 01/31/2023   Procedure: APPLICATION OF CRANIAL NAVIGATION;  Surgeon: Tressie Stalker, MD;  Location: [facility] OR;  Service: Neurosurgery;  Laterality: N/A;   CARPAL TUNNEL RELEASE Bilateral 1997   CHONDROPLASTY Right 09/23/2019   Procedure: CHONDROPLASTY;  Surgeon: Tarry Kos, MD;  Location: [facility];  Service: Orthopedics;  Laterality: Right;   CRANIOTOMY Right 01/31/2023   Procedure: CRANIOTOMY TUMOR EXCISION;  Surgeon: Tressie Stalker, MD;  Location: [facility] OR;  Service: Neurosurgery;  Laterality: Right;   KNEE ARTHROSCOPY WITH MEDIAL MENISECTOMY Right 09/23/2019   Procedure: RIGHT KNEE ARTHROSCOPY WITH PARTIAL MEDIAL MENISCECTOMY;  Surgeon: Tarry Kos, MD;  Location: [facility];  Service: Orthopedics;  Laterality: Right;   KNEE SURGERY Left 1992   Patient Active Problem List   Diagnosis Date Noted   Malignant neoplasm of temporal lobe of brain (HCC) 03/04/2023   Glioblastoma, IDH-wildtype (HCC) 01/30/2023    Effusion, right knee 10/28/2019   Acute medial meniscal tear, right, initial encounter 09/04/2019    ONSET DATE: 07/08/2023 (referral)   REFERRING DIAG: C71.9 (ICD-10-CM) - Glioblastoma, IDH-wildtype (HCC)  THERAPY DIAG:  Unsteadiness on feet  Muscle weakness (generalized)  Other abnormalities of gait and mobility  Rationale for Evaluation and Treatment: Rehabilitation  SUBJECTIVE:  SUBJECTIVE STATEMENT: "[name]"   Pt reports doing well, denies dizziness or pain today. HEP is going well. No falls.   Pt accompanied by: self  PERTINENT HISTORY: right temporal glioblastoma  PAIN:  Are you having pain? No pt reports occasional knee and neck pain, inconsistent    PRECAUTIONS: Fall  RED FLAGS: None   WEIGHT BEARING RESTRICTIONS: No  FALLS: Has patient fallen in last 6 months? No  LIVING ENVIRONMENT: Lives with: lives with their spouse Lives in: House/apartment Stairs: No Has following equipment at home: Single point cane, Walker - 2 wheeled, shower chair, bed side commode, and Grab bars  PLOF: Independent  PATIENT GOALS: "I would like to get some of my strength back and make sure my balance stays good"   OBJECTIVE:  Note: Objective measures were completed at Evaluation unless otherwise noted.  DIAGNOSTIC FINDINGS: MRI of brain from 06/25/23  IMPRESSION: 1. Previous right lateral craniotomy for resection of a lateral right temporal lobe mass. Post resection space does not show any complicating features. The margins of the post resection space show only thin enhancement, with the single exception of the medial roof which shows a 3 mm focus of slightly thicker enhancement that can be followed on subsequent exams. Mild T2 and FLAIR signal emanates outward from the roof of the  resection within the white matter. 2. Right mastoid effusion.  COGNITION: Overall cognitive status: Within functional limits for tasks assessed   SENSATION: Bilateral carpal tunnel   POSTURE: No Significant postural limitations  LOWER EXTREMITY ROM:     Active  Right Eval Left Eval  Hip flexion    Hip extension    Hip abduction    Hip adduction    Hip internal rotation    Hip external rotation    Knee flexion    Knee extension    Ankle dorsiflexion    Ankle plantarflexion    Ankle inversion    Ankle eversion     (Blank rows = not tested)  LOWER EXTREMITY MMT:  Tested in seated position   MMT Right Eval Left Eval  Hip flexion 5 5  Hip extension    Hip abduction 5 5  Hip adduction 5 5  Hip internal rotation    Hip external rotation    Knee flexion 5 5  Knee extension 5 5  Ankle dorsiflexion 5 5  Ankle plantarflexion    Ankle inversion    Ankle eversion    (Blank rows = not tested)  BED MOBILITY:  Independent per pt   TRANSFERS: Assistive device utilized: None  Sit to stand: Complete Independence Stand to sit: Complete Independence   STAIRS: Level of Assistance: Modified independence Stair Negotiation Technique: Alternating Pattern  with Bilateral Rails Number of Stairs: 4  Height of Stairs: 6"  Comments: Reduced speed, no instability noted   GAIT: Gait pattern:  Minor lateral gait deviations, step through pattern, scissoring, and narrow BOS Distance walked: Various clinic distances  Assistive device utilized: None Level of assistance: Modified independence Comments: Noted occasional scissoring of RLE, occasional deviation to L side   FUNCTIONAL TESTS:  MCTSIB: Condition 1: Avg of 3 trials: 30 sec, Condition 2: Avg of 3 trials: 30 sec, Condition 3: Avg of 3 trials: 30 (minor A/P away) sec, Condition 4: Avg of 3 trials: 30 (minor A/P sway) sec, and Total Score: 120/120    VITALS  Vitals:   07/31/23 0842  BP: 122/70  Pulse: 68  TREATMENT: Self-care Assessed BP (see above) and WNL  Educated pt on Love your brain program and provided pt w/flyer. Pt interested in going next week and informed pt he could bring his wife.   Ther Act SciFit multi-peaks level 12 for 8 minutes using BUE/BLEs for neural priming for reciprocal movement, dynamic cardiovascular warmup and increased amplitude of stepping.  Child's pose to cobra flow on mat table, x10 reps, for improved spinal mobility and hip flexor stretch. Pt denied dizziness w/activity.   NMR  In // bars, alt step ups w/contralateral march to Bosu (blue side) for improved single leg stability, LE coordination and hip flexor strength.  At ballet bar, 6 Blaze pods on random reach setting for improved LE coordination, visual scanning and single leg stability.  Performed on 2 minute intervals with 30s rest periods.  Pt requires SBA guarding and BUE support. Round 1:  w/RLE on green foam and pods placed in semicircle to L side.  40 hits. Round 2:  w/LLE on green foam and pods placed in semicircle to R side setup.  53 hits. Round 3:  w/RLE on green foam and pods placed in semicircle to L side setup.  47 hits. Notable errors/deficits:  Increased difficulty finding pods placed posterolaterally to L. Decreased stability on RLE.    PATIENT EDUCATION: Education details: Continue HEP, Love Your Brain  Person educated: Patient Education method: Chief Technology Officer Education comprehension: verbalized understanding  HOME EXERCISE PROGRAM: Access Code: [email or web address] URL: [email or web address] Date: 07/17/2023 Prepared by: Peter Congo  Exercises - Standing Tandem Balance with Counter Support  - 1 x daily - 7 x weekly - 1 sets - 3-5 reps - 30-45 sec hold - Standard Lunge  - 1 x daily - 7 x weekly - 3 sets - 10 reps - Dumbbell Thruster   - 1 x daily - 7 x  weekly - 3 sets - 10 reps  GOALS: Goals reviewed with patient? Yes  STG = LTG DUE TO POC LENGTH    LONG TERM GOALS: Target date: 08/08/2023   Pt will be independent with gym-based HEP and return to gym for improved strength, balance and independence   Baseline: not established on eval  Goal status: INITIAL   ASSESSMENT:  CLINICAL IMPRESSION: Emphasis of skilled PT session on LE coordination, single leg stability and functional hip strength. Pt demonstrates increased difficulty stabilizing on RLE and difficulty coordinating leg to posterolateral L direction. 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 Continue POC.     OBJECTIVE IMPAIRMENTS: Abnormal gait, decreased activity tolerance, decreased balance, dizziness, and pain  ACTIVITY LIMITATIONS: standing and locomotion level  PARTICIPATION LIMITATIONS: driving, shopping, community activity, occupation, and yard work  PERSONAL FACTORS: Age, Fitness, and 1 comorbidity: glioblastoma  are also affecting patient's functional outcome.   REHAB POTENTIAL: Good  CLINICAL DECISION MAKING: Stable/uncomplicated  EVALUATION COMPLEXITY: Low  PLAN:  PT FREQUENCY: 1x/week  PT DURATION: 4 weeks  PLANNED INTERVENTIONS: 97164- PT Re-evaluation, 97110-Therapeutic exercises, 97530- Therapeutic activity, [id number]- Neuromuscular re-education, 97535- Self Care, 16109- Manual therapy, [id number]- Gait training, [phone]- Canalith repositioning, [phone]- Aquatic Therapy, Patient/Family education, Balance training, Dry Needling, Joint mobilization, and Vestibular training  PLAN FOR NEXT SESSION: Goals and DC? Work on return to gym - lifting, functional strength, high level coordination tasks. Single leg blaze pods, agility drills    Jill Alexanders Leeum Sankey, PT, DPT  07/31/2023, 9:26 AM

## 2023-08-02 ENCOUNTER — Other Ambulatory Visit: Payer: Self-pay

## 2023-08-05 ENCOUNTER — Inpatient Hospital Stay: Payer: Medicare Other | Attending: Internal Medicine

## 2023-08-05 ENCOUNTER — Other Ambulatory Visit: Payer: Self-pay

## 2023-08-05 ENCOUNTER — Inpatient Hospital Stay (HOSPITAL_BASED_OUTPATIENT_CLINIC_OR_DEPARTMENT_OTHER): Payer: Medicare Other | Admitting: Internal Medicine

## 2023-08-05 VITALS — BP 112/74 | HR 74 | Temp 98.4°F | Resp 14 | Wt 164.7 lb

## 2023-08-05 DIAGNOSIS — C712 Malignant neoplasm of temporal lobe: Secondary | ICD-10-CM | POA: Insufficient documentation

## 2023-08-05 DIAGNOSIS — D696 Thrombocytopenia, unspecified: Secondary | ICD-10-CM | POA: Insufficient documentation

## 2023-08-05 DIAGNOSIS — C719 Malignant neoplasm of brain, unspecified: Secondary | ICD-10-CM

## 2023-08-05 LAB — CBC WITH DIFFERENTIAL (CANCER CENTER ONLY)
Abs Immature Granulocytes: 0.01 10*3/uL (ref 0.00–0.07)
Basophils Absolute: 0 10*3/uL (ref 0.0–0.1)
Basophils Relative: 1 %
Eosinophils Absolute: 0.1 10*3/uL (ref 0.0–0.5)
Eosinophils Relative: 3 %
HCT: 44.5 % (ref 39.0–52.0)
Hemoglobin: 15.1 g/dL (ref 13.0–17.0)
Immature Granulocytes: 0 %
Lymphocytes Relative: 24 %
Lymphs Abs: 0.9 10*3/uL (ref 0.7–4.0)
MCH: 28.3 pg (ref 26.0–34.0)
MCHC: 33.9 g/dL (ref 30.0–36.0)
MCV: 83.5 fL (ref 80.0–100.0)
Monocytes Absolute: 0.5 10*3/uL (ref 0.1–1.0)
Monocytes Relative: 13 %
Neutro Abs: 2.2 10*3/uL (ref 1.7–7.7)
Neutrophils Relative %: 59 %
Platelet Count: 132 10*3/uL — ABNORMAL LOW (ref 150–400)
RBC: 5.33 MIL/uL (ref 4.22–5.81)
RDW: 13.1 % (ref 11.5–15.5)
WBC Count: 3.8 10*3/uL — ABNORMAL LOW (ref 4.0–10.5)
nRBC: 0 % (ref 0.0–0.2)

## 2023-08-05 LAB — CMP (CANCER CENTER ONLY)
ALT: 17 U/L (ref 0–44)
AST: 16 U/L (ref 15–41)
Albumin: 4.5 g/dL (ref 3.5–5.0)
Alkaline Phosphatase: 52 U/L (ref 38–126)
Anion gap: 6 (ref 5–15)
BUN: 25 mg/dL — ABNORMAL HIGH (ref 8–23)
CO2: 27 mmol/L (ref 22–32)
Calcium: 9.6 mg/dL (ref 8.9–10.3)
Chloride: 106 mmol/L (ref 98–111)
Creatinine: 1.27 mg/dL — ABNORMAL HIGH (ref 0.61–1.24)
GFR, Estimated: 60 mL/min — ABNORMAL LOW (ref >60)
Glucose, Bld: 90 mg/dL (ref 70–99)
Potassium: 4.3 mmol/L (ref 3.5–5.1)
Sodium: 139 mmol/L (ref 135–145)
Total Bilirubin: 1.1 mg/dL (ref 0.0–1.2)
Total Protein: 7.4 g/dL (ref 6.5–8.1)

## 2023-08-05 MED ORDER — TEMOZOLOMIDE 140 MG PO CAPS
150.0000 mg/m2/d | ORAL_CAPSULE | Freq: Every day | ORAL | 0 refills | Status: DC
  Filled 2023-08-05: qty 10, 5d supply, fill #0
  Filled 2023-08-06: qty 10, 28d supply, fill #0

## 2023-08-05 NOTE — Progress Notes (Signed)
 [facility] at [facility] [address]  [place], [place] [address] [phone]   Interval Evaluation  Date of Service: 08/05/23 Patient Name: Juan Garrett Patient MRN: [id number] Patient DOB: 1951/01/11 Provider: Henreitta Leber, MD  Identifying Statement:  Juan Garrett is a 73 y.o. male with right temporal glioblastoma   Oncologic History: Oncology History  Glioblastoma, IDH-wildtype (HCC)  01/31/2023 Surgery   Craniotomy, resection of right temporal mass with Dr. Lovell Sheehan; pathology demonstrates Glioblastoma, IDH-wt   04/05/2023 Surgery   Repeat right temporal resection with Dr. Zachery Conch at [facility]   04/29/2023 -  Radiation Therapy   IMRT with concurrent Temodar 75mg /m2     Biomarkers:  MGMT Unknown.  IDH 1/2 Wild type.  EGFR Unknown  TERT Unknown   Interval History: Juan Garrett presents today for follow up, now having completed first cycle of 5-day Temodar.  He is feeling well, no new or progressive neurologic deficits.  He did have some nausea and constipation with Temodar.  Denies headaches, seizures.  Remains active, independent.  H+P (02/28/23) Patient presented to neurologic attention in August 2024 with several weeks of progressive confusion, disorientation, and several falls.  CNS imaging demonstrated large cystic mass in right temporal lobe.  He underwent craniotomy, resection with Dr. Lovell Sheehan on 01/31/23 without complication, pathology demonstrated glioblastoma.  Since surgery, he has been at home, doing well.  No issues with gait, no recurrence of confusion, visual spatial issues.  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  Medications: Current Outpatient Medications on File Prior to Visit  Medication Sig Dispense Refill   amLODipine-benazepril (LOTREL) 5-10 MG capsule Take 1 capsule by mouth daily.     atorvastatin (LIPITOR) 10 MG tablet Take 10 mg by mouth daily.     famotidine (PEPCID) 40 MG tablet Take 40 mg  by mouth daily.     Fexofenadine HCl (ALLEGRA PO) Take by mouth daily.     fluticasone (FLONASE) 50 MCG/ACT nasal spray Place 1 spray into both nostrils daily.     Fluticasone Furoate (ARNUITY ELLIPTA) 50 MCG/ACT AEPB      HYDROcodone-acetaminophen (NORCO/VICODIN) 5-325 MG tablet Take 1 tablet by mouth every 4 (four) hours as needed for moderate pain. 30 tablet 0   ibuprofen (ADVIL) 200 MG tablet Take 200 mg by mouth daily as needed for mild pain.     ondansetron (ZOFRAN) 8 MG tablet Take 1 tablet (8 mg total) by mouth every 8 (eight) hours as needed for nausea or vomiting. May take 30-60 minutes prior to Temodar administration if nausea/vomiting occurs as needed. 30 tablet 1   temozolomide (TEMODAR) 140 MG capsule Take 2 capsules (280 mg total) by mouth at bedtime. Take for 5 days on, then off for 23 days. Repeat every 28 days. May take on an empty stomach to decrease nausea & vomiting. 10 capsule 0   Vitamin D, Ergocalciferol, (DRISDOL) 1.25 MG (50000 UNIT) CAPS capsule Take 50,000 Units by mouth every 14 (fourteen) days.     No current facility-administered medications on file prior to visit.    Allergies:  Allergies  Allergen Reactions   Aspirin Other (See Comments)    Upset stomach   Past Medical History:  Past Medical History:  Diagnosis Date   Allergy    seasonal allergies   Arthritis    bilateral knees   Cataract    not a surgical candidate at this time (07/06/2020)   GERD (gastroesophageal reflux disease)    on meds  Hypertension    on meds   Past Surgical History:  Past Surgical History:  Procedure Laterality Date   APPLICATION OF CRANIAL NAVIGATION N/A 01/31/2023   Procedure: APPLICATION OF CRANIAL NAVIGATION;  Surgeon: Tressie Stalker, MD;  Location: [facility] OR;  Service: Neurosurgery;  Laterality: N/A;   CARPAL TUNNEL RELEASE Bilateral 1997   CHONDROPLASTY Right 09/23/2019   Procedure: CHONDROPLASTY;  Surgeon: Tarry Kos, MD;  Location: [facility];   Service: Orthopedics;  Laterality: Right;   CRANIOTOMY Right 01/31/2023   Procedure: CRANIOTOMY TUMOR EXCISION;  Surgeon: Tressie Stalker, MD;  Location: [facility] OR;  Service: Neurosurgery;  Laterality: Right;   KNEE ARTHROSCOPY WITH MEDIAL MENISECTOMY Right 09/23/2019   Procedure: RIGHT KNEE ARTHROSCOPY WITH PARTIAL MEDIAL MENISCECTOMY;  Surgeon: Tarry Kos, MD;  Location: [facility];  Service: Orthopedics;  Laterality: Right;   KNEE SURGERY Left 1992   Social History:  Social History   Socioeconomic History   Marital status: Married    Spouse name: Not on file   Number of children: Not on file   Years of education: Not on file   Highest education level: Not on file  Occupational History   Not on file  Tobacco Use   Smoking status: Former    Types: Cigarettes   Smokeless tobacco: Never  Vaping Use   Vaping status: Never Used  Substance and Sexual Activity   Alcohol use: Not Currently   Drug use: Never   Sexual activity: Not on file  Other Topics Concern   Not on file  Social History Narrative   Not on file   Social Drivers of Health   Financial Resource Strain: Not on file  Food Insecurity: No Food Insecurity (03/04/2023)   Hunger Vital Sign    Worried About Running Out of Food in the Last Year: Never true    Ran Out of Food in the Last Year: Never true  Transportation Needs: No Transportation Needs (04/05/2023)   Received from [facility] - Transportation    In the past 12 months, has lack of transportation kept you from medical appointments or from getting medications?: No    Lack of Transportation (Non-Medical): No  Physical Activity: Not on file  Stress: Not on file  Social Connections: Not on file  Intimate Partner Violence: Not At Risk (03/04/2023)   Humiliation, Afraid, Rape, and Kick questionnaire    Fear of Current or Ex-Partner: No    Emotionally Abused: No    Physically Abused: No    Sexually Abused: No   Family  History:  Family History  Problem Relation Age of Onset   Colon polyps Neg Hx    Colon cancer Neg Hx    Esophageal cancer Neg Hx    Stomach cancer Neg Hx    Rectal cancer Neg Hx     Review of Systems: Constitutional: Doesn't report fevers, chills or abnormal weight loss Eyes: Doesn't report blurriness of vision Ears, nose, mouth, throat, and face: Doesn't report sore throat Respiratory: Doesn't report cough, dyspnea or wheezes Cardiovascular: Doesn't report palpitation, chest discomfort  Gastrointestinal:  Doesn't report nausea, constipation, diarrhea GU: Doesn't report incontinence Skin: Doesn't report skin rashes Neurological: Per HPI Musculoskeletal: Doesn't report joint pain Behavioral/Psych: Doesn't report anxiety  Physical Exam: Vitals:   08/05/23 1222  BP: 112/74  Pulse: 74  Resp: 14  Temp: 98.4 F (36.9 C)  SpO2: 98%   KPS: 90. General: Alert, cooperative, pleasant, in no acute  distress Head: Normal EENT: No conjunctival injection or scleral icterus.  Lungs: Resp effort normal Cardiac: Regular rate Abdomen: Non-distended abdomen Skin: No rashes cyanosis or petechiae. Extremities: No clubbing or edema  Neurologic Exam: Mental Status: Awake, alert, attentive to examiner. Oriented to self and environment. Language is fluent with intact comprehension.  Cranial Nerves: Visual acuity is grossly normal. Visual fields are full. Extra-ocular movements intact. No ptosis. Face is symmetric Motor: Tone and bulk are normal. Power is full in both arms and legs. Reflexes are symmetric, no pathologic reflexes present.  Sensory: Intact to light touch Gait: Normal.   Labs: I have reviewed the data as listed    Component Value Date/Time   NA 139 08/05/2023 1140   K 4.3 08/05/2023 1140   CL 106 08/05/2023 1140   CO2 27 08/05/2023 1140   GLUCOSE 90 08/05/2023 1140   BUN 25 (H) 08/05/2023 1140   CREATININE 1.27 (H) 08/05/2023 1140   CALCIUM 9.6 08/05/2023 1140   PROT  7.4 08/05/2023 1140   ALBUMIN 4.5 08/05/2023 1140   AST 16 08/05/2023 1140   ALT 17 08/05/2023 1140   ALKPHOS 52 08/05/2023 1140   BILITOT 1.1 08/05/2023 1140   GFRNONAA 60 (L) 08/05/2023 1140   Lab Results  Component Value Date   WBC 3.8 (L) 08/05/2023   NEUTROABS 2.2 08/05/2023   HGB 15.1 08/05/2023   HCT 44.5 08/05/2023   MCV 83.5 08/05/2023   PLT 132 (L) 08/05/2023     Assessment/Plan Glioblastoma, IDH-wildtype (HCC)  Juan Garrett is clinically stable today, now having completed cycle #1 5-day Temodar.  Labs demonstrate modest thrombocytopenia today.  We recommended continuing treatment with cycle #2 Temozolomide 150mg /m2 without dose increase, on for five days and off for twenty three days in twenty eight day cycles. The patient will have a complete blood count performed on days 21 and 28 of each cycle, and a comprehensive metabolic panel performed on day 28 of each cycle. Labs may need to be performed more often. Zofran will prescribed for home use for nausea/vomiting.   Informed consent was obtained verbally at bedside to proceed with oral chemotherapy.  Chemotherapy should be held for the following:  ANC less than 1,000  Platelets less than 100,000  LFT or creatinine greater than 2x ULN  If clinical concerns/contraindications develop  We ask that Juan Garrett return to clinic in 1 months with MRI brain prior to cycle #3, or sooner as needed.  All questions were answered. The patient knows to call the clinic with any problems, questions or concerns. No barriers to learning were detected.  The total time spent in the encounter was 30 minutes and more than 50% was on counseling and review of test results   Henreitta Leber, MD Medical Director of Neuro-Oncology [facility] at [facility] 08/05/23 12:39 PM

## 2023-08-06 ENCOUNTER — Other Ambulatory Visit (HOSPITAL_COMMUNITY): Payer: Self-pay

## 2023-08-06 ENCOUNTER — Other Ambulatory Visit: Payer: Self-pay | Admitting: Internal Medicine

## 2023-08-06 ENCOUNTER — Encounter (HOSPITAL_COMMUNITY): Payer: Self-pay

## 2023-08-06 ENCOUNTER — Other Ambulatory Visit: Payer: Self-pay

## 2023-08-06 ENCOUNTER — Telehealth: Payer: Self-pay | Admitting: Internal Medicine

## 2023-08-06 MED ORDER — MECLIZINE HCL 25 MG PO TABS: 25.0000 mg | ORAL_TABLET | Freq: Three times a day (TID) | ORAL | 0 refills | Status: DC | PRN

## 2023-08-06 NOTE — Progress Notes (Signed)
 Specialty Pharmacy Ongoing Clinical Assessment Note  Juan Garrett is a 73 y.o. male who is being followed by the specialty pharmacy service for RxSp Oncology   Patient's specialty medication(s) reviewed today: Temozolomide (TEMODAR)   Missed doses in the last 4 weeks: 0   Patient/Caregiver did not have any additional questions or concerns.   Therapeutic benefit summary: Patient is achieving benefit   Adverse events/side effects summary: Experienced adverse events/side effects (fatigue, nausea (has zofran on hand when needed), vertigo (takes meclizine))   Patient's therapy is appropriate to: Continue    Goals Addressed             This Visit's Progress    Achieve or maintain remission   No change    Patient is initiating therapy. Patient will maintain adherence      Slow Disease Progression   On track    Patient is initiating therapy. Patient will maintain adherence.         Follow up:  3 months  Servando Snare Specialty Pharmacist

## 2023-08-06 NOTE — Progress Notes (Signed)
 Specialty Pharmacy Refill Coordination Note  Juan Garrett is a 73 y.o. male contacted today regarding refills of specialty medication(s) Temozolomide [name])   Patient requested [name] at [facility] [place] date: 08/07/23   Medication will be filled on 08/06/23.

## 2023-08-06 NOTE — Telephone Encounter (Signed)
 Juan Garrett

## 2023-08-07 ENCOUNTER — Ambulatory Visit: Payer: Medicare Other | Admitting: Physical Therapy

## 2023-08-07 ENCOUNTER — Other Ambulatory Visit: Payer: Self-pay

## 2023-08-07 ENCOUNTER — Other Ambulatory Visit (HOSPITAL_COMMUNITY): Payer: Self-pay

## 2023-08-07 ENCOUNTER — Other Ambulatory Visit (HOSPITAL_BASED_OUTPATIENT_CLINIC_OR_DEPARTMENT_OTHER): Payer: Self-pay

## 2023-08-07 ENCOUNTER — Encounter: Payer: Self-pay | Admitting: Internal Medicine

## 2023-08-07 VITALS — BP 120/69 | HR 73

## 2023-08-07 DIAGNOSIS — M6281 Muscle weakness (generalized): Secondary | ICD-10-CM

## 2023-08-07 DIAGNOSIS — R2689 Other abnormalities of gait and mobility: Secondary | ICD-10-CM

## 2023-08-07 DIAGNOSIS — R2681 Unsteadiness on feet: Secondary | ICD-10-CM | POA: Diagnosis not present

## 2023-08-07 NOTE — Therapy (Signed)
 OUTPATIENT PHYSICAL THERAPY NEURO TREATMENT - RECERTIFICATION    Patient Name: Juan Garrett MRN: [id number] DOB:Jul 01, 1950, 73 y.o., male Today's Date: 08/07/2023   PCP: Georgann Housekeeper, MD REFERRING PROVIDER: Henreitta Leber, MD  END OF SESSION:  PT End of Session - 08/07/23 0849     Visit Number 5    Number of Visits 7   Recert   Date for PT Re-Evaluation 08/22/23    Authorization Type Medicare    PT Start Time 0846    PT Stop Time 0928    PT Time Calculation (min) 42 min    Equipment Utilized During Treatment Gait belt    Activity Tolerance Patient tolerated treatment well    Behavior During Therapy [facility] for tasks assessed/performed                 Past Medical History:  Diagnosis Date   Allergy    seasonal allergies   Arthritis    bilateral knees   Cataract    not a surgical candidate at this time (07/06/2020)   GERD (gastroesophageal reflux disease)    on meds   Hypertension    on meds   Past Surgical History:  Procedure Laterality Date   APPLICATION OF CRANIAL NAVIGATION N/A 01/31/2023   Procedure: APPLICATION OF CRANIAL NAVIGATION;  Surgeon: Tressie Stalker, MD;  Location: [facility] OR;  Service: Neurosurgery;  Laterality: N/A;   CARPAL TUNNEL RELEASE Bilateral 1997   CHONDROPLASTY Right 09/23/2019   Procedure: CHONDROPLASTY;  Surgeon: Tarry Kos, MD;  Location: [facility];  Service: Orthopedics;  Laterality: Right;   CRANIOTOMY Right 01/31/2023   Procedure: CRANIOTOMY TUMOR EXCISION;  Surgeon: Tressie Stalker, MD;  Location: [facility] OR;  Service: Neurosurgery;  Laterality: Right;   KNEE ARTHROSCOPY WITH MEDIAL MENISECTOMY Right 09/23/2019   Procedure: RIGHT KNEE ARTHROSCOPY WITH PARTIAL MEDIAL MENISCECTOMY;  Surgeon: Tarry Kos, MD;  Location: [facility];  Service: Orthopedics;  Laterality: Right;   KNEE SURGERY Left 1992   Patient Active Problem List   Diagnosis Date Noted   Malignant neoplasm of temporal lobe of  brain (HCC) 03/04/2023   Glioblastoma, IDH-wildtype (HCC) 01/30/2023   Effusion, right knee 10/28/2019   Acute medial meniscal tear, right, initial encounter 09/04/2019    ONSET DATE: 07/08/2023 (referral)   REFERRING DIAG: C71.9 (ICD-10-CM) - Glioblastoma, IDH-wildtype (HCC)  THERAPY DIAG:  Unsteadiness on feet  Muscle weakness (generalized)  Other abnormalities of gait and mobility  Rationale for Evaluation and Treatment: Rehabilitation  SUBJECTIVE:  SUBJECTIVE STATEMENT: "[name]"   Pt reports doing okay, more dizzy today. Took a meclizine last night. No falls. Enjoyed the Love Your Brain class, did not get in the floor due to vertigo.   Pt accompanied by: self  PERTINENT HISTORY: right temporal glioblastoma  PAIN:  Are you having pain? No pt reports occasional knee and neck pain, inconsistent    PRECAUTIONS: Fall  RED FLAGS: None   WEIGHT BEARING RESTRICTIONS: No  FALLS: Has patient fallen in last 6 months? No  LIVING ENVIRONMENT: Lives with: lives with their spouse Lives in: House/apartment Stairs: No Has following equipment at home: Single point cane, Walker - 2 wheeled, shower chair, bed side commode, and Grab bars  PLOF: Independent  PATIENT GOALS: "I would like to get some of my strength back and make sure my balance stays good"   OBJECTIVE:  Note: Objective measures were completed at Evaluation unless otherwise noted.  DIAGNOSTIC FINDINGS: MRI of brain from 06/25/23  IMPRESSION: 1. Previous right lateral craniotomy for resection of a lateral right temporal lobe mass. Post resection space does not show any complicating features. The margins of the post resection space show only thin enhancement, with the single exception of the medial roof which shows a 3 mm focus of  slightly thicker enhancement that can be followed on subsequent exams. Mild T2 and FLAIR signal emanates outward from the roof of the resection within the white matter. 2. Right mastoid effusion.  COGNITION: Overall cognitive status: Within functional limits for tasks assessed   SENSATION: Bilateral carpal tunnel   POSTURE: No Significant postural limitations  LOWER EXTREMITY ROM:     Active  Right Eval Left Eval  Hip flexion    Hip extension    Hip abduction    Hip adduction    Hip internal rotation    Hip external rotation    Knee flexion    Knee extension    Ankle dorsiflexion    Ankle plantarflexion    Ankle inversion    Ankle eversion     (Blank rows = not tested)  LOWER EXTREMITY MMT:  Tested in seated position   MMT Right Eval Left Eval  Hip flexion 5 5  Hip extension    Hip abduction 5 5  Hip adduction 5 5  Hip internal rotation    Hip external rotation    Knee flexion 5 5  Knee extension 5 5  Ankle dorsiflexion 5 5  Ankle plantarflexion    Ankle inversion    Ankle eversion    (Blank rows = not tested)  BED MOBILITY:  Independent per pt   TRANSFERS: Assistive device utilized: None  Sit to stand: Complete Independence Stand to sit: Complete Independence   STAIRS: Level of Assistance: Modified independence Stair Negotiation Technique: Alternating Pattern  with Bilateral Rails Number of Stairs: 4  Height of Stairs: 6"  Comments: Reduced speed, no instability noted   GAIT: Gait pattern:  Minor lateral gait deviations, step through pattern, scissoring, and narrow BOS Distance walked: Various clinic distances  Assistive device utilized: None Level of assistance: Modified independence Comments: Noted occasional scissoring of RLE, occasional deviation to L side   FUNCTIONAL TESTS:  MCTSIB: Condition 1: Avg of 3 trials: 30 sec, Condition 2: Avg of 3 trials: 30 sec, Condition 3: Avg of 3 trials: 30 (minor A/P away) sec, Condition 4: Avg of 3  trials: 30 (minor A/P sway) sec, and Total Score: 120/120    VITALS  Vitals:   08/07/23 1610  BP: 120/69  Pulse: 73                                                                                                                                 TREATMENT: Self-care Assessed BP (see above) and WNL  Discussed POC moving forward and pt requesting to add more appointments to POC to address dizziness/potential BPPV. Pt reports he gets very dizzy if he looks up or tilts his head back, so he avoids these movements. Performs Brandt-Daroff at home occasionally and it does help, but he does not do it consistently.   NMR   MCTSIB: Condition 1: Avg of 3 trials: 30 sec, Condition 2: Avg of 3 trials: 30 sec, Condition 3: Avg of 3 trials: 30 sec, Condition 4: Avg of 3 trials: 30 sec (minor A/P sway), and Total Score: 120/120   At ballet bar, 6 Blaze pods on random reach setting for improved single leg stability, visual scanning, cog dual-tasking and LE coordination.  Performed on 2 minute intervals with 1 minute rest periods.  Pt requires CGA guarding and intermittent BUE support. Round 1:  On blue balance beam w/3 pods placed on either side of beam, instructed pt to ambulate tandem fwd/retro to tap pods to either side.  19 hits. Round 2:  Same setup w/added cog dual-task (naming foods if pod is blue, animals if pod is red).  18 hits. Notable errors/deficits:  Increased difficulty stabilizing on RLE and poor foot placement w/retro gait, but no major LOB noted    Ther Act  Staggered stance RDLs w/25# KB, x14 reps per side, for improved posterior chain strength and modified single leg stance. SBA throughout, no LOB noted  Standing marches w/single arm 12# OH hold, x5 reps per side per arm. Increased difficulty when holding KB in LUE. CGA throughout.     PATIENT EDUCATION: Education details: Continue HEP, plan to see vestibular therapist next session, do no take Meclizine prior to next session  unless absolutely necessary  Person educated: Patient Education method: Explanation and Handouts Education comprehension: verbalized understanding  HOME EXERCISE PROGRAM: Access Code: [email or web address] URL: [email or web address] Date: 07/17/2023 Prepared by: Peter Congo  Exercises - Standing Tandem Balance with Counter Support  - 1 x daily - 7 x weekly - 1 sets - 3-5 reps - 30-45 sec hold - Standard Lunge  - 1 x daily - 7 x weekly - 3 sets - 10 reps - Dumbbell Thruster   - 1 x daily - 7 x weekly - 3 sets - 10 reps  GOALS: Goals reviewed with patient? Yes  STG = LTG DUE TO POC LENGTH    LONG TERM GOALS: Target date: 08/08/2023   Pt will be independent with gym-based HEP and return to gym for improved strength, balance and independence   Baseline: not established on eval  Goal status: NOT MET - no longer covered by insurance    NEW LTG= STG DUE TO POC  LENGTH  NEW LONG TERM GOALS:  Target date: 08/22/2023  Pt will be independent with vestibular HEP for improved balance and reduced dizziness.  Baseline: not established  Goal status: INITIAL     ASSESSMENT:  CLINICAL IMPRESSION: Emphasis of skilled PT session on LE coordination, single leg stability, functional LE strength and balance systems assessment via MCTSIB. Pt reports he could not get onto floor during yoga class due to vertigo, which is triggered by tilting his head back or looking up. Will plan for pt to see vestibular therapist next session to assess for BPPV and establish vestibular HEP. Pt most challenged by tandem and retro gait this date but tolerated session well. Pt did not meet his LTG of returning to gym, as his insurance no longer covers gym membership, but is lifting weights at home. Continue POC.     OBJECTIVE IMPAIRMENTS: Abnormal gait, decreased activity tolerance, decreased balance, dizziness, and pain  ACTIVITY LIMITATIONS: standing and locomotion level  PARTICIPATION LIMITATIONS:  driving, shopping, community activity, occupation, and yard work  PERSONAL FACTORS: Age, Fitness, and 1 comorbidity: glioblastoma  are also affecting patient's functional outcome.   REHAB POTENTIAL: Good  CLINICAL DECISION MAKING: Stable/uncomplicated  EVALUATION COMPLEXITY: Low  PLAN:  PT FREQUENCY: 1x/week  PT DURATION: 4 weeks + 2 weeks (recert)   PLANNED INTERVENTIONS: 46962- PT Re-evaluation, 97110-Therapeutic exercises, 97530- Therapeutic activity, [id number]- Neuromuscular re-education, 97535- Self Care, [id number]- Manual therapy, [id number]- Gait training, [phone]- Canalith repositioning, [phone]- Aquatic Therapy, Patient/Family education, Balance training, Dry Needling, Joint mobilization, and Vestibular training  PLAN FOR NEXT SESSION: Assess for BPPV. Establish vestibular HEP (he already does Brandt-daroff). Head turns, high level balance. Try a floor transfer since this makes him dizzy    Jill Alexanders Diora Bellizzi, PT, DPT  08/07/2023, 9:29 AM

## 2023-08-15 ENCOUNTER — Encounter: Payer: Self-pay | Admitting: Physical Therapy

## 2023-08-15 ENCOUNTER — Ambulatory Visit: Payer: Self-pay | Attending: Internal Medicine | Admitting: Physical Therapy

## 2023-08-15 VITALS — BP 112/60 | HR 66

## 2023-08-15 DIAGNOSIS — R2681 Unsteadiness on feet: Secondary | ICD-10-CM | POA: Insufficient documentation

## 2023-08-15 DIAGNOSIS — R2689 Other abnormalities of gait and mobility: Secondary | ICD-10-CM | POA: Diagnosis present

## 2023-08-15 DIAGNOSIS — M6281 Muscle weakness (generalized): Secondary | ICD-10-CM | POA: Insufficient documentation

## 2023-08-15 NOTE — Therapy (Signed)
 OUTPATIENT PHYSICAL THERAPY NEURO TREATMENT    Patient Name: Juan Garrett MRN: [id number] DOB:1950-12-06, 73 y.o., male Today's Date: 08/15/2023   PCP: Georgann Housekeeper, MD REFERRING PROVIDER: Henreitta Leber, MD  END OF SESSION:  PT End of Session - 08/15/23 0847     Visit Number 6    Number of Visits 7   Recert   Date for PT Re-Evaluation 08/22/23    Authorization Type Medicare    PT Start Time 0846    PT Stop Time 0915   full time not used due to resolution of BPPV   PT Time Calculation (min) 29 min    Equipment Utilized During Treatment Gait belt    Activity Tolerance Patient tolerated treatment well    Behavior During Therapy WFL for tasks assessed/performed                 Past Medical History:  Diagnosis Date   Allergy    seasonal allergies   Arthritis    bilateral knees   Cataract    not a surgical candidate at this time (07/06/2020)   GERD (gastroesophageal reflux disease)    on meds   Hypertension    on meds   Past Surgical History:  Procedure Laterality Date   APPLICATION OF CRANIAL NAVIGATION N/A 01/31/2023   Procedure: APPLICATION OF CRANIAL NAVIGATION;  Surgeon: Tressie Stalker, MD;  Location: [facility] OR;  Service: Neurosurgery;  Laterality: N/A;   CARPAL TUNNEL RELEASE Bilateral 1997   CHONDROPLASTY Right 09/23/2019   Procedure: CHONDROPLASTY;  Surgeon: Tarry Kos, MD;  Location: [facility];  Service: Orthopedics;  Laterality: Right;   CRANIOTOMY Right 01/31/2023   Procedure: CRANIOTOMY TUMOR EXCISION;  Surgeon: Tressie Stalker, MD;  Location: [facility] OR;  Service: Neurosurgery;  Laterality: Right;   KNEE ARTHROSCOPY WITH MEDIAL MENISECTOMY Right 09/23/2019   Procedure: RIGHT KNEE ARTHROSCOPY WITH PARTIAL MEDIAL MENISCECTOMY;  Surgeon: Tarry Kos, MD;  Location: [facility];  Service: Orthopedics;  Laterality: Right;   KNEE SURGERY Left 1992   Patient Active Problem List   Diagnosis Date Noted   Malignant  neoplasm of temporal lobe of brain (HCC) 03/04/2023   Glioblastoma, IDH-wildtype (HCC) 01/30/2023   Effusion, right knee 10/28/2019   Acute medial meniscal tear, right, initial encounter 09/04/2019    ONSET DATE: 07/08/2023 (referral)   REFERRING DIAG: C71.9 (ICD-10-CM) - Glioblastoma, IDH-wildtype (HCC)  THERAPY DIAG:  Unsteadiness on feet  Muscle weakness (generalized)  Other abnormalities of gait and mobility  Rationale for Evaluation and Treatment: Rehabilitation  SUBJECTIVE:  SUBJECTIVE STATEMENT: "Juan Garrett"   Finished up his chemo on Monday. Feels nauseous every day. Has tried the [employer] exercise before for his dizziness. Reports it is not fun.   Pt accompanied by: self  PERTINENT HISTORY: right temporal glioblastoma  PAIN:  Are you having pain? No pt reports occasional knee and neck pain, inconsistent    PRECAUTIONS: Fall  RED FLAGS: None   WEIGHT BEARING RESTRICTIONS: No  FALLS: Has patient fallen in last 6 months? No  LIVING ENVIRONMENT: Lives with: lives with their spouse Lives in: House/apartment Stairs: No Has following equipment at home: Single point cane, Walker - 2 wheeled, shower chair, bed side commode, and Grab bars  PLOF: Independent  PATIENT GOALS: "I would like to get some of my strength back and make sure my balance stays good"   OBJECTIVE:  Note: Objective measures were completed at Evaluation unless otherwise noted.  DIAGNOSTIC FINDINGS: MRI of brain from 06/25/23  IMPRESSION: 1. Previous right lateral craniotomy for resection of a lateral right temporal lobe mass. Post resection space does not show any complicating features. The margins of the post resection space show only thin enhancement, with the single exception of the medial roof which  shows a 3 mm focus of slightly thicker enhancement that can be followed on subsequent exams. Mild T2 and FLAIR signal emanates outward from the roof of the resection within the white matter. 2. Right mastoid effusion.  COGNITION: Overall cognitive status: Within functional limits for tasks assessed   SENSATION: Bilateral carpal tunnel   POSTURE: No Significant postural limitations  LOWER EXTREMITY ROM:     Active  Right Eval Left Eval  Hip flexion    Hip extension    Hip abduction    Hip adduction    Hip internal rotation    Hip external rotation    Knee flexion    Knee extension    Ankle dorsiflexion    Ankle plantarflexion    Ankle inversion    Ankle eversion     (Blank rows = not tested)  LOWER EXTREMITY MMT:  Tested in seated position   MMT Right Eval Left Eval  Hip flexion 5 5  Hip extension    Hip abduction 5 5  Hip adduction 5 5  Hip internal rotation    Hip external rotation    Knee flexion 5 5  Knee extension 5 5  Ankle dorsiflexion 5 5  Ankle plantarflexion    Ankle inversion    Ankle eversion    (Blank rows = not tested)  BED MOBILITY:  Independent per pt   TRANSFERS: Assistive device utilized: None  Sit to stand: Complete Independence Stand to sit: Complete Independence   STAIRS: Level of Assistance: Modified independence Stair Negotiation Technique: Alternating Pattern  with Bilateral Rails Number of Stairs: 4  Height of Stairs: 6"  Comments: Reduced speed, no instability noted   GAIT: Gait pattern:  Minor lateral gait deviations, step through pattern, scissoring, and narrow BOS Distance walked: Various clinic distances  Assistive device utilized: None Level of assistance: Modified independence Comments: Noted occasional scissoring of RLE, occasional deviation to L side   FUNCTIONAL TESTS:  MCTSIB: Condition 1: Avg of 3 trials: 30 sec, Condition 2: Avg of 3 trials: 30 sec, Condition 3: Avg of 3 trials: 30 (minor A/P away) sec,  Condition 4: Avg of 3 trials: 30 (minor A/P sway) sec, and Total Score: 120/120    VITALS  Vitals:   08/15/23 0855  BP: 112/60  Pulse: 66                                                                                                                                  TREATMENT:  VESTIBULAR ASSESSMENT   GENERAL OBSERVATION: Ambulates in with no AD independently.     SYMPTOM BEHAVIOR:   Subjective history: Notes dizziness comes and goes, some days are better than others. Noticed dizziness more after surgery.   Non-Vestibular symptoms: neck pain and nausea/vomiting   Type of dizziness:  "feels dizzy and nausea comes with it"    Frequency: Daily    Duration: Brief, about 30 seconds    Aggravating factors: Induced by position change: rolling to the right and rolling to the left and Induced by motion: looking up at the ceiling and bending down to the ground   Relieving factors:  Getting up and moving doing something    Progression of symptoms:  "depends on the day, some days are better than others"   OCULOMOTOR EXAM:   Ocular Alignment: normal   Ocular ROM: No Limitations   Spontaneous Nystagmus: absent   Gaze-Induced Nystagmus: absent   Smooth Pursuits: intact   Saccades: intact and ~2 saccadic beats in horizontal direction     Cervical AROM, WFL, pt reporting some dizziness looking up     POSITIONAL TESTING: Right Dix-Hallpike: upbeating, right nystagmus and initially more intense for 8-10 seconds, then more mild and lasting for a total of 20 seconds  Left Dix-Hallpike: no nystagmus      VESTIBULAR TREATMENT:  Canalith Repositioning:   Epley Right: Number of Reps: 1, Response to Treatment: symptoms resolved, and Comment: With re-assessment, no nystagmus or dizziness   PATIENT EDUCATION: Education details: BPPV education and etiology, purpose of Epley maneuver for treatment for R posterior canal BPPV, discussed will schedule one more follow-up visit next week just in  case, but if dizziness doesn't return then can cancel appt. Discussed in future if BPPV returns, can try self R Epley maneuver for home (provided handout) or can get a new referral to return in the future  Person educated: Patient Education method: Explanation, Demonstration, Verbal cues, and Handouts Education comprehension: verbalized understanding and returned demonstration  HOME EXERCISE PROGRAM: Access Code: [email or web address] URL: [email or web address] Date: 07/17/2023 Prepared by: Peter Congo  Exercises - Standing Tandem Balance with Counter Support  - 1 x daily - 7 x weekly - 1 sets - 3-5 reps - 30-45 sec hold - Standard Lunge  - 1 x daily - 7 x weekly - 3 sets - 10 reps - Dumbbell Thruster   - 1 x daily - 7 x weekly - 3 sets - 10 reps  GOALS: Goals reviewed with patient? Yes  STG = LTG DUE TO POC LENGTH    LONG TERM GOALS: Target date: 08/08/2023   Pt will be independent with gym-based HEP and return to gym for improved strength, balance and independence   Baseline:  not established on eval  Goal status: NOT MET - no longer covered by insurance    NEW LTG= STG DUE TO POC LENGTH  NEW LONG TERM GOALS:  Target date: 08/22/2023  Pt will be independent with vestibular HEP for improved balance and reduced dizziness.  Baseline: not established  Goal status: INITIAL     ASSESSMENT:  CLINICAL IMPRESSION: Today's skilled session focused on vestibular assessment due to pt reporting dizziness with looking up, tilting his head back, or bed mobility. Pt with R upbeating rotary nystagmus lasting approx. 20 seconds in R DixHallpike, indicating R posterior canalithiasis. Treated with 1 rep of the Epley maneuver and pt demonstrating resolution afterwards. Provided education regarding BPPV and scheduled one more follow-up appt next week to make sure BPPV stays resolved. Will continue per POC.       OBJECTIVE IMPAIRMENTS: Abnormal gait, decreased activity tolerance,  decreased balance, dizziness, and pain  ACTIVITY LIMITATIONS: standing and locomotion level  PARTICIPATION LIMITATIONS: driving, shopping, community activity, occupation, and yard work  PERSONAL FACTORS: Age, Fitness, and 1 comorbidity: glioblastoma  are also affecting patient's functional outcome.   REHAB POTENTIAL: Good  CLINICAL DECISION MAKING: Stable/uncomplicated  EVALUATION COMPLEXITY: Low  PLAN:  PT FREQUENCY: 1x/week  PT DURATION: 4 weeks + 2 weeks (recert)   PLANNED INTERVENTIONS: 16109- PT Re-evaluation, 97110-Therapeutic exercises, 97530- Therapeutic activity, [id number]- Neuromuscular re-education, 97535- Self Care, [id number]- Manual therapy, [id number]- Gait training, [phone]- Canalith repositioning, [phone]- Aquatic Therapy, Patient/Family education, Balance training, Dry Needling, Joint mobilization, and Vestibular training  PLAN FOR NEXT SESSION: did BPPV return? If so treat as needed. Or if not D/C    Drake Leach, PT, DPT  08/15/2023, 9:21 AM

## 2023-08-21 ENCOUNTER — Encounter: Payer: Self-pay | Admitting: Physical Therapy

## 2023-08-23 ENCOUNTER — Ambulatory Visit: Payer: Self-pay | Admitting: Physical Therapy

## 2023-08-29 ENCOUNTER — Telehealth: Payer: Self-pay | Admitting: *Deleted

## 2023-08-29 ENCOUNTER — Ambulatory Visit (HOSPITAL_COMMUNITY)
Admission: RE | Admit: 2023-08-29 | Discharge: 2023-08-29 | Disposition: A | Discharge status: Home / Self Care | Payer: Medicare Other | Source: Ambulatory Visit | Attending: Internal Medicine | Admitting: Internal Medicine

## 2023-08-29 DIAGNOSIS — C719 Malignant neoplasm of brain, unspecified: Secondary | ICD-10-CM | POA: Diagnosis present

## 2023-08-29 MED ORDER — GADOBUTROL 1 MMOL/ML IV SOLN
7.0000 mL | Freq: Once | INTRAVENOUS | Status: AC | PRN
  Administered 2023-08-29: 7 mL via INTRAVENOUS

## 2023-08-29 NOTE — Telephone Encounter (Signed)
 Requested that images from today's MRI brain be pushed to [facility] through [place].

## 2023-08-30 ENCOUNTER — Other Ambulatory Visit: Payer: Self-pay

## 2023-09-02 ENCOUNTER — Encounter: Payer: Self-pay | Admitting: *Deleted

## 2023-09-02 NOTE — Progress Notes (Signed)
Requested that MRI be pushed to [facility]

## 2023-09-03 ENCOUNTER — Other Ambulatory Visit: Payer: Self-pay

## 2023-09-05 ENCOUNTER — Other Ambulatory Visit: Payer: Self-pay

## 2023-09-05 ENCOUNTER — Inpatient Hospital Stay: Payer: Medicare Other

## 2023-09-05 ENCOUNTER — Inpatient Hospital Stay: Payer: Medicare Other | Attending: Internal Medicine | Admitting: Internal Medicine

## 2023-09-05 VITALS — BP 135/66 | HR 73 | Temp 97.1°F | Resp 17 | Wt 164.2 lb

## 2023-09-05 DIAGNOSIS — Z923 Personal history of irradiation: Secondary | ICD-10-CM | POA: Insufficient documentation

## 2023-09-05 DIAGNOSIS — C719 Malignant neoplasm of brain, unspecified: Secondary | ICD-10-CM | POA: Diagnosis not present

## 2023-09-05 DIAGNOSIS — C712 Malignant neoplasm of temporal lobe: Secondary | ICD-10-CM | POA: Diagnosis present

## 2023-09-05 DIAGNOSIS — Z79630 Long term (current) use of alkylating agent: Secondary | ICD-10-CM | POA: Diagnosis not present

## 2023-09-05 LAB — CBC WITH DIFFERENTIAL (CANCER CENTER ONLY)
Abs Immature Granulocytes: 0.02 10*3/uL (ref 0.00–0.07)
Basophils Absolute: 0.1 10*3/uL (ref 0.0–0.1)
Basophils Relative: 1 %
Eosinophils Absolute: 0.3 10*3/uL (ref 0.0–0.5)
Eosinophils Relative: 6 %
HCT: 43.1 % (ref 39.0–52.0)
Hemoglobin: 14.5 g/dL (ref 13.0–17.0)
Immature Granulocytes: 1 %
Lymphocytes Relative: 25 %
Lymphs Abs: 1.1 10*3/uL (ref 0.7–4.0)
MCH: 28.9 pg (ref 26.0–34.0)
MCHC: 33.6 g/dL (ref 30.0–36.0)
MCV: 86 fL (ref 80.0–100.0)
Monocytes Absolute: 0.5 10*3/uL (ref 0.1–1.0)
Monocytes Relative: 11 %
Neutro Abs: 2.5 10*3/uL (ref 1.7–7.7)
Neutrophils Relative %: 56 %
Platelet Count: 155 10*3/uL (ref 150–400)
RBC: 5.01 MIL/uL (ref 4.22–5.81)
RDW: 13.7 % (ref 11.5–15.5)
WBC Count: 4.3 10*3/uL (ref 4.0–10.5)
nRBC: 0 % (ref 0.0–0.2)

## 2023-09-05 LAB — CMP (CANCER CENTER ONLY)
ALT: 17 U/L (ref 0–44)
AST: 15 U/L (ref 15–41)
Albumin: 4.2 g/dL (ref 3.5–5.0)
Alkaline Phosphatase: 48 U/L (ref 38–126)
Anion gap: 4 — ABNORMAL LOW (ref 5–15)
BUN: 24 mg/dL — ABNORMAL HIGH (ref 8–23)
CO2: 30 mmol/L (ref 22–32)
Calcium: 9.4 mg/dL (ref 8.9–10.3)
Chloride: 108 mmol/L (ref 98–111)
Creatinine: 1.13 mg/dL (ref 0.61–1.24)
GFR, Estimated: >60 mL/min (ref >60)
Glucose, Bld: 78 mg/dL (ref 70–99)
Potassium: 4.4 mmol/L (ref 3.5–5.1)
Sodium: 142 mmol/L (ref 135–145)
Total Bilirubin: 0.6 mg/dL (ref 0.0–1.2)
Total Protein: 6.8 g/dL (ref 6.5–8.1)

## 2023-09-05 NOTE — Progress Notes (Signed)
 [facility] at [facility] [address]  [place], [place] [address] [phone]   Interval Evaluation  Date of Service: 09/05/23 Patient Name: Bedford Winsor Patient MRN: [id number] Patient DOB: Jun 11, 1951 Provider: Henreitta Leber, MD  Identifying Statement:  Fuller Makin is a 73 y.o. male with right temporal glioblastoma   Oncologic History: Oncology History  Glioblastoma, IDH-wildtype (HCC)  01/31/2023 Surgery   Craniotomy, resection of right temporal mass with Dr. Lovell Sheehan; pathology demonstrates Glioblastoma, IDH-wt   04/05/2023 Surgery   Repeat right temporal resection with Dr. Zachery Conch at [facility]   04/29/2023 -  Radiation Therapy   IMRT with concurrent Temodar 75mg /m2     Biomarkers:  MGMT Unknown.  IDH 1/2 Wild type.  EGFR Unknown  TERT Unknown   Interval History: Deward Sebek presents today for follow up, now having completed cycle #2 of 5-day Temodar.  He did well with treatment this month.  He did have some nausea and constipation with Temodar.  Denies headaches, seizures.  Remains active, independent.  H+P (02/28/23) Patient presented to neurologic attention in August 2024 with several weeks of progressive confusion, disorientation, and several falls.  CNS imaging demonstrated large cystic mass in right temporal lobe.  He underwent craniotomy, resection with Dr. Lovell Sheehan on 01/31/23 without complication, pathology demonstrated glioblastoma.  Since surgery, he has been at home, doing well.  No issues with gait, no recurrence of confusion, visual spatial issues.  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  Medications: Current Outpatient Medications on File Prior to Visit  Medication Sig Dispense Refill   amLODipine-benazepril (LOTREL) 5-10 MG capsule Take 1 capsule by mouth daily.     atorvastatin (LIPITOR) 10 MG tablet Take 10 mg by mouth daily.     famotidine (PEPCID) 40 MG tablet Take 40 mg by mouth daily.      Fexofenadine HCl (ALLEGRA PO) Take by mouth daily.     fluticasone (FLONASE) 50 MCG/ACT nasal spray Place 1 spray into both nostrils daily.     Fluticasone Furoate (ARNUITY ELLIPTA) 50 MCG/ACT AEPB      HYDROcodone-acetaminophen (NORCO/VICODIN) 5-325 MG tablet Take 1 tablet by mouth every 4 (four) hours as needed for moderate pain. 30 tablet 0   ibuprofen (ADVIL) 200 MG tablet Take 200 mg by mouth daily as needed for mild pain.     meclizine (ANTIVERT) 25 MG tablet Take 1 tablet (25 mg total) by mouth 3 (three) times daily as needed for dizziness. 30 tablet 0   ondansetron (ZOFRAN) 8 MG tablet Take 1 tablet (8 mg total) by mouth every 8 (eight) hours as needed for nausea or vomiting. May take 30-60 minutes prior to Temodar administration if nausea/vomiting occurs as needed. 30 tablet 1   temozolomide (TEMODAR) 140 MG capsule Take 2 capsules (280 mg total) by mouth at bedtime. Take for 5 days on, then off for 23 days. Repeat every 28 days. May take on an empty stomach to decrease nausea & vomiting. 10 capsule 0   temozolomide (TEMODAR) 140 MG capsule Take 2 capsules (280 mg total) by mouth at bedtime. Take for 5 days on, then off for 23 days. Repeat every 28 days. May take on an empty stomach to decrease nausea & vomiting. 10 capsule 0   Vitamin D, Ergocalciferol, (DRISDOL) 1.25 MG (50000 UNIT) CAPS capsule Take 50,000 Units by mouth every 14 (fourteen) days.     No current facility-administered medications on file prior to visit.    Allergies:  Allergies  Allergen Reactions   Aspirin Other (See Comments)    Upset stomach   Past Medical History:  Past Medical History:  Diagnosis Date   Allergy    seasonal allergies   Arthritis    bilateral knees   Cataract    not a surgical candidate at this time (07/06/2020)   GERD (gastroesophageal reflux disease)    on meds   Hypertension    on meds   Past Surgical History:  Past Surgical History:  Procedure Laterality Date   APPLICATION OF  CRANIAL NAVIGATION N/A 01/31/2023   Procedure: APPLICATION OF CRANIAL NAVIGATION;  Surgeon: Tressie Stalker, MD;  Location: [facility] OR;  Service: Neurosurgery;  Laterality: N/A;   CARPAL TUNNEL RELEASE Bilateral 1997   CHONDROPLASTY Right 09/23/2019   Procedure: CHONDROPLASTY;  Surgeon: Tarry Kos, MD;  Location: [facility];  Service: Orthopedics;  Laterality: Right;   CRANIOTOMY Right 01/31/2023   Procedure: CRANIOTOMY TUMOR EXCISION;  Surgeon: Tressie Stalker, MD;  Location: [facility] OR;  Service: Neurosurgery;  Laterality: Right;   KNEE ARTHROSCOPY WITH MEDIAL MENISECTOMY Right 09/23/2019   Procedure: RIGHT KNEE ARTHROSCOPY WITH PARTIAL MEDIAL MENISCECTOMY;  Surgeon: Tarry Kos, MD;  Location: [facility];  Service: Orthopedics;  Laterality: Right;   KNEE SURGERY Left 1992   Social History:  Social History   Socioeconomic History   Marital status: Married    Spouse name: Not on file   Number of children: Not on file   Years of education: Not on file   Highest education level: Not on file  Occupational History   Not on file  Tobacco Use   Smoking status: Former    Types: Cigarettes   Smokeless tobacco: Never  Vaping Use   Vaping status: Never Used  Substance and Sexual Activity   Alcohol use: Not Currently   Drug use: Never   Sexual activity: Not on file  Other Topics Concern   Not on file  Social History Narrative   Not on file   Social Drivers of Health   Financial Resource Strain: Not on file  Food Insecurity: No Food Insecurity (03/04/2023)   Hunger Vital Sign    Worried About Running Out of Food in the Last Year: Never true    Ran Out of Food in the Last Year: Never true  Transportation Needs: No Transportation Needs (04/05/2023)   Received from [facility] - Transportation    In the past 12 months, has lack of transportation kept you from medical appointments or from getting medications?: No    Lack of  Transportation (Non-Medical): No  Physical Activity: Not on file  Stress: Not on file  Social Connections: Not on file  Intimate Partner Violence: Not At Risk (03/04/2023)   Humiliation, Afraid, Rape, and Kick questionnaire    Fear of Current or Ex-Partner: No    Emotionally Abused: No    Physically Abused: No    Sexually Abused: No   Family History:  Family History  Problem Relation Age of Onset   Colon polyps Neg Hx    Colon cancer Neg Hx    Esophageal cancer Neg Hx    Stomach cancer Neg Hx    Rectal cancer Neg Hx     Review of Systems: Constitutional: Doesn't report fevers, chills or abnormal weight loss Eyes: Doesn't report blurriness of vision Ears, nose, mouth, throat, and face: Doesn't report sore throat Respiratory: Doesn't report cough, dyspnea or wheezes Cardiovascular: Doesn't  report palpitation, chest discomfort  Gastrointestinal:  Doesn't report nausea, constipation, diarrhea GU: Doesn't report incontinence Skin: Doesn't report skin rashes Neurological: Per HPI Musculoskeletal: Doesn't report joint pain Behavioral/Psych: Doesn't report anxiety  Physical Exam: There were no vitals filed for this visit.  KPS: 90. General: Alert, cooperative, pleasant, in no acute distress Head: Normal EENT: No conjunctival injection or scleral icterus.  Lungs: Resp effort normal Cardiac: Regular rate Abdomen: Non-distended abdomen Skin: No rashes cyanosis or petechiae. Extremities: No clubbing or edema  Neurologic Exam: Mental Status: Awake, alert, attentive to examiner. Oriented to self and environment. Language is fluent with intact comprehension.  Cranial Nerves: Visual acuity is grossly normal. Visual fields are full. Extra-ocular movements intact. No ptosis. Face is symmetric Motor: Tone and bulk are normal. Power is full in both arms and legs. Reflexes are symmetric, no pathologic reflexes present.  Sensory: Intact to light touch Gait: Normal.   Labs: I have  reviewed the data as listed    Component Value Date/Time   NA 139 08/05/2023 1140   K 4.3 08/05/2023 1140   CL 106 08/05/2023 1140   CO2 27 08/05/2023 1140   GLUCOSE 90 08/05/2023 1140   BUN 25 (H) 08/05/2023 1140   CREATININE 1.27 (H) 08/05/2023 1140   CALCIUM 9.6 08/05/2023 1140   PROT 7.4 08/05/2023 1140   ALBUMIN 4.5 08/05/2023 1140   AST 16 08/05/2023 1140   ALT 17 08/05/2023 1140   ALKPHOS 52 08/05/2023 1140   BILITOT 1.1 08/05/2023 1140   GFRNONAA 60 (L) 08/05/2023 1140   Lab Results  Component Value Date   WBC 4.3 09/05/2023   NEUTROABS 2.5 09/05/2023   HGB 14.5 09/05/2023   HCT 43.1 09/05/2023   MCV 86.0 09/05/2023   PLT 155 09/05/2023    Imaging:  [facility] Clinician Interpretation: I have personally reviewed the CNS images as listed.  My interpretation, in the context of the patient's clinical presentation, is stable disease  MR BRAIN W WO CONTRAST Result Date: 08/29/2023 CLINICAL DATA:  73 year old male glioblastoma IDH wild-type. Restaging. EXAM: MRI HEAD WITHOUT AND WITH CONTRAST TECHNIQUE: Multiplanar, multiecho pulse sequences of the brain and surrounding structures were obtained without and with intravenous contrast. CONTRAST:  7mL GADAVIST GADOBUTROL 1 MMOL/ML IV SOLN COMPARISON:  06/25/2023 and earlier. FINDINGS: Brain: Posterior and lateral right temporal lobe resection cavity with stable size and configuration. Regional T2 and FLAIR hyperintensity is stable and relatively mild (series 9, image 22). Stable mild hemosiderin. No suspicious diffusion changes. And following contrast subtle linear enhancement along the superior margin of the cavity (series 18, image 7) is identified and slightly more discrete and elongated. No associated mass effect. Overlying post craniotomy changes are stable and satisfactory. No superimposed restricted diffusion to suggest acute infarction. No midline shift, mass effect,ventriculomegaly, no new signal abnormality. Scattered mild for age  nonspecific white matter T2 and FLAIR hyperintensity. Or acute intracranial hemorrhage. Cervicomedullary junction and pituitary are within normal limits. Vascular: Major intracranial vascular flow voids are preserved. Following contrast major dural venous sinuses are enhancing and appear to be patent. Skull and upper cervical spine: Stable craniotomy. Normal background bone marrow signal. Negative for age visible cervical spine and spinal cord. Sinuses/Orbits: Stable and negative. Other: Mild right mastoid effusion is stable. Stable other visible internal auditory structures, and of note there is of a chronic punctate area of enhancement in the right internal auditory canal on series 16, image 34 which is unchanged from 01/30/2023 and incidental. IMPRESSION: 1. Minimally increased linear  enhancement along the superior margin of the resection cavity from that seen on 06/25/2023. Otherwise stable and satisfactory post treatment appearance. 2. No new intracranial abnormality. Electronically Signed   By: Odessa Fleming M.D.   On: 08/29/2023 11:29    Assessment/Plan Glioblastoma, IDH-wildtype (HCC)  Kleber Crean Headrick is clinically stable today, now having completed cycle #2 5-day Temodar.  MRI demonstrates essentially stable findings.  We recommended continuing treatment with cycle #3 Temozolomide 150mg /m2, on for five days and off for twenty three days in twenty eight day cycles. The patient will have a complete blood count performed on days 21 and 28 of each cycle, and a comprehensive metabolic panel performed on day 28 of each cycle. Labs may need to be performed more often. Zofran will prescribed for home use for nausea/vomiting.   He is under consideration for a clinical trial at [facility]) and would like to defer this month's treatment until he has decided on study participation.  Chemotherapy should be held for the following:  ANC less than 1,000  Platelets less than 100,000  LFT or creatinine  greater than 2x ULN  If clinical concerns/contraindications develop  We ask that Nesta Kimple Leavenworth return to clinic in 1 months with labs prior to cycle #4, or sooner as needed.  All questions were answered. The patient knows to call the clinic with any problems, questions or concerns. No barriers to learning were detected.  The total time spent in the encounter was 40 minutes and more than 50% was on counseling and review of test results   Henreitta Leber, MD Medical Director of Neuro-Oncology [facility] 09/05/23 9:22 AM

## 2023-09-06 ENCOUNTER — Other Ambulatory Visit: Payer: Self-pay

## 2023-09-10 ENCOUNTER — Other Ambulatory Visit: Payer: Self-pay

## 2023-09-12 ENCOUNTER — Other Ambulatory Visit: Payer: Self-pay

## 2023-09-13 ENCOUNTER — Other Ambulatory Visit: Payer: Self-pay

## 2023-09-13 ENCOUNTER — Telehealth: Payer: Self-pay | Admitting: Internal Medicine

## 2023-09-13 NOTE — Telephone Encounter (Signed)
 Scheduled patient's appts. Advised patient to contact us if rescheduling is needed. Provided my direct line.

## 2023-09-13 NOTE — Telephone Encounter (Signed)
 Patient stated that he will be under the care of [facility] on a clinical trial. Transferred the call to Nurse Darel Hong.

## 2023-09-14 ENCOUNTER — Other Ambulatory Visit: Payer: Self-pay

## 2023-09-16 ENCOUNTER — Other Ambulatory Visit: Payer: Self-pay | Admitting: *Deleted

## 2023-09-16 DIAGNOSIS — C719 Malignant neoplasm of brain, unspecified: Secondary | ICD-10-CM

## 2023-09-17 ENCOUNTER — Other Ambulatory Visit: Payer: Self-pay

## 2023-09-20 ENCOUNTER — Other Ambulatory Visit: Payer: Self-pay

## 2023-09-23 ENCOUNTER — Other Ambulatory Visit: Payer: Self-pay

## 2023-10-03 ENCOUNTER — Ambulatory Visit: Admitting: Internal Medicine

## 2023-10-03 ENCOUNTER — Other Ambulatory Visit

## 2023-10-24 ENCOUNTER — Other Ambulatory Visit (HOSPITAL_COMMUNITY): Payer: Self-pay

## 2023-10-24 NOTE — Progress Notes (Signed)
 Patient has transferred care to [facility] to participate in a clinical trial.  Disenrolling from Specialty Pharmacy services at this time.

## 2023-11-05 ENCOUNTER — Telehealth: Payer: Self-pay | Admitting: Radiation Therapy

## 2023-11-05 NOTE — Telephone Encounter (Signed)
 PT's wife called about the brain MRI he had scheduled tomorrow being cancelled unexpectedly and the scan for July as well. He has an appointment at [facility] this Friday 5/30, the brain scan that was scheduled for tomorrow, 5/28, was supposed to help them gauge his next treatment for his trial at [facility].   She did mention that the patient remembers talking to someone from Dr. Boris Byars office about these but not sure what was said. Will you please give his wife a call about this today [phone]), she is a little panicked about the scan not being done in time for his visit on Friday.   This message was shared with Dr. Boris Byars team via EPIC message. Davee Erm RN, Anner Kill RN and the Med Onc POD 4)  Axel Bohr R.T(R)(T) Radiation Special Procedures Lead

## 2023-11-06 ENCOUNTER — Ambulatory Visit (HOSPITAL_COMMUNITY)
Admission: RE | Admit: 2023-11-06 | Discharge: 2023-11-06 | Disposition: A | Discharge status: Home / Self Care | Source: Ambulatory Visit | Attending: Internal Medicine | Admitting: Internal Medicine

## 2023-11-06 ENCOUNTER — Encounter (HOSPITAL_COMMUNITY)

## 2023-11-06 DIAGNOSIS — C719 Malignant neoplasm of brain, unspecified: Secondary | ICD-10-CM | POA: Insufficient documentation

## 2023-11-06 MED ORDER — GADOBUTROL 1 MMOL/ML IV SOLN
7.0000 mL | Freq: Once | INTRAVENOUS | Status: AC | PRN
  Administered 2023-11-06: 7 mL via INTRAVENOUS

## 2023-11-07 ENCOUNTER — Encounter: Payer: Self-pay | Admitting: *Deleted

## 2023-11-07 NOTE — Progress Notes (Signed)
 Requested that MRI brain images from 11/06/23 be pushed to [facility].  Request emailed to [employer].

## 2023-11-15 IMAGING — DX DG CHEST 2V
2 series · 2 of 2 positions shown · non-contrast
Comparison: None.

CLINICAL DATA: Subacute cough.

EXAM:
CHEST - 2 VIEW

[dg chest 2 view (1 of 2)]
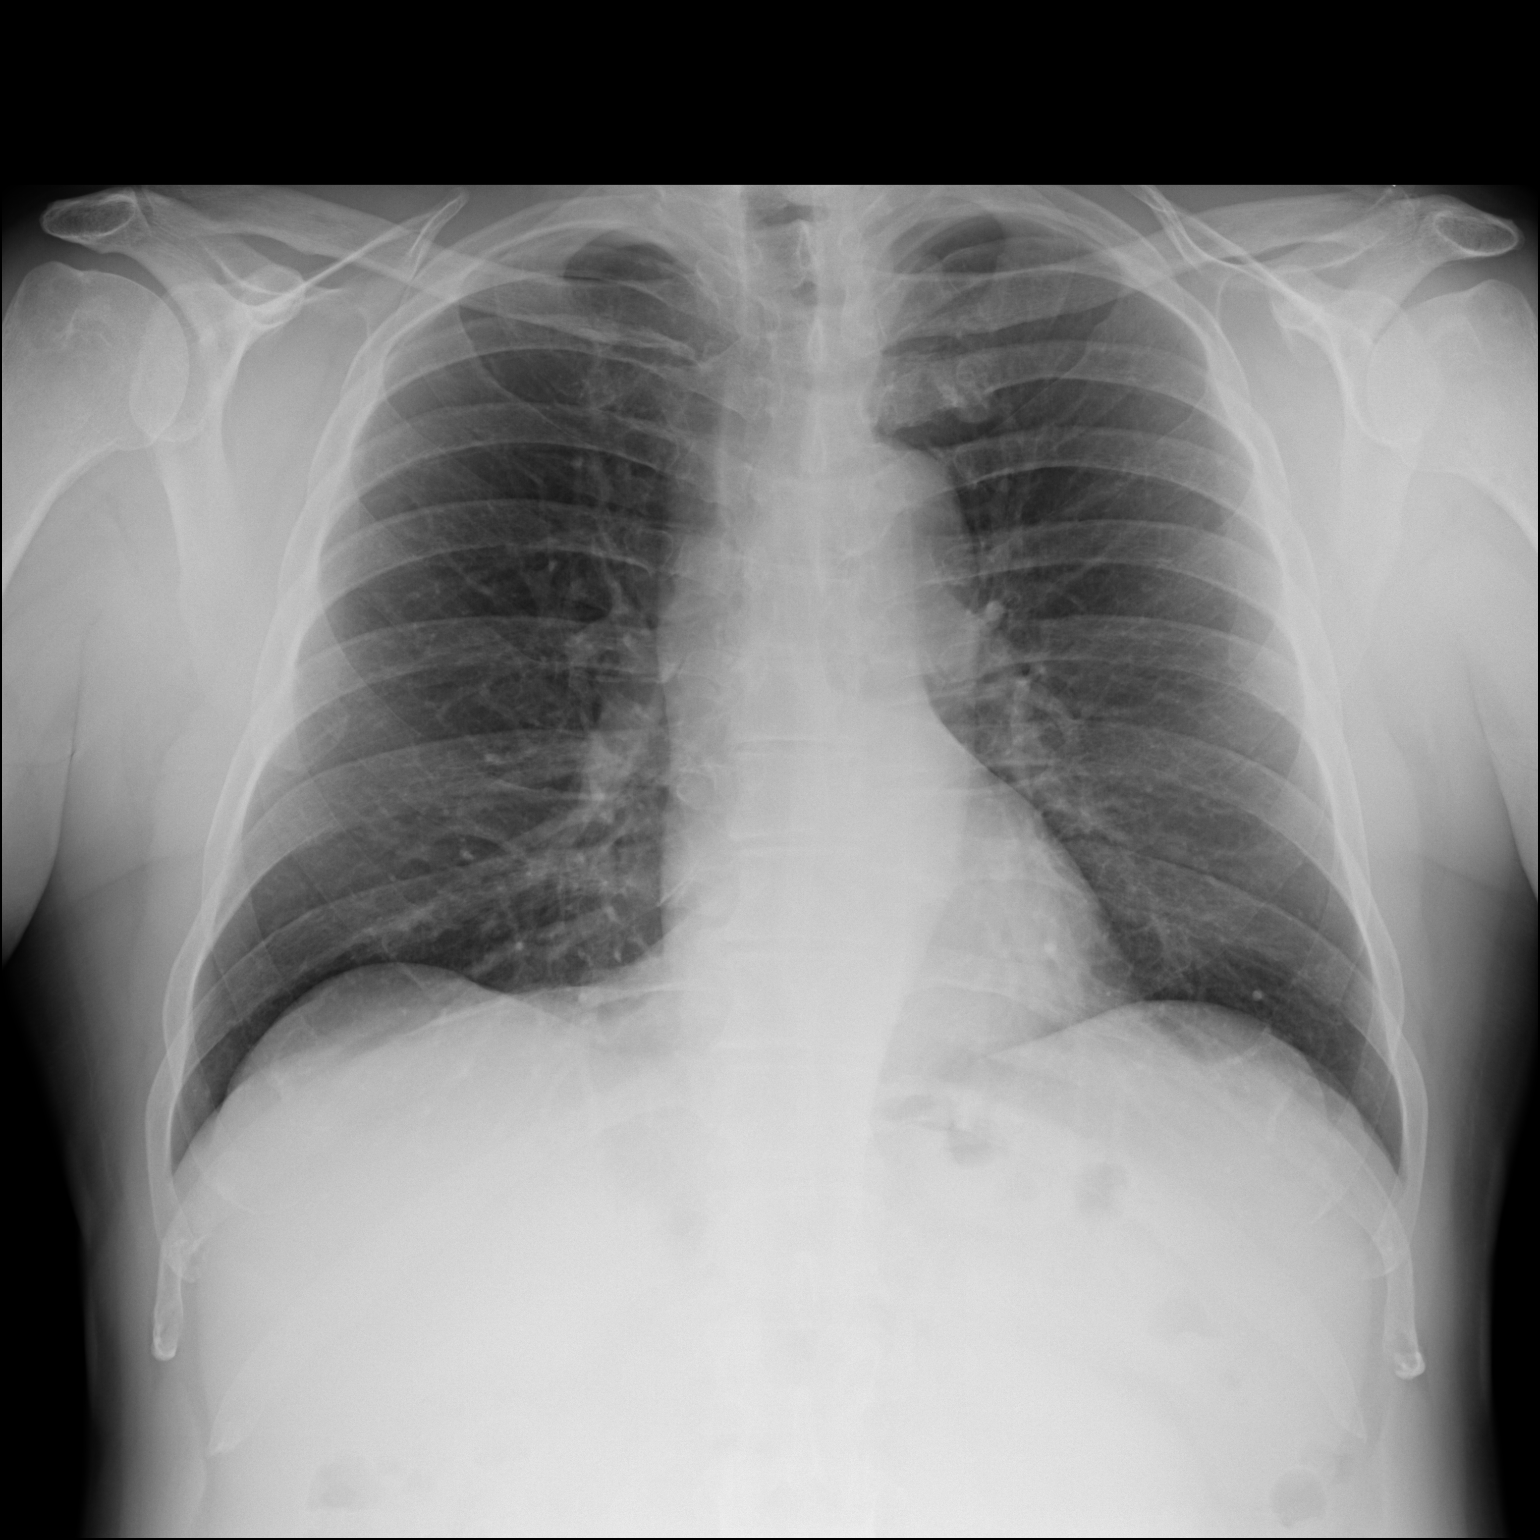

[dg chest 2 view (2 of 2)]
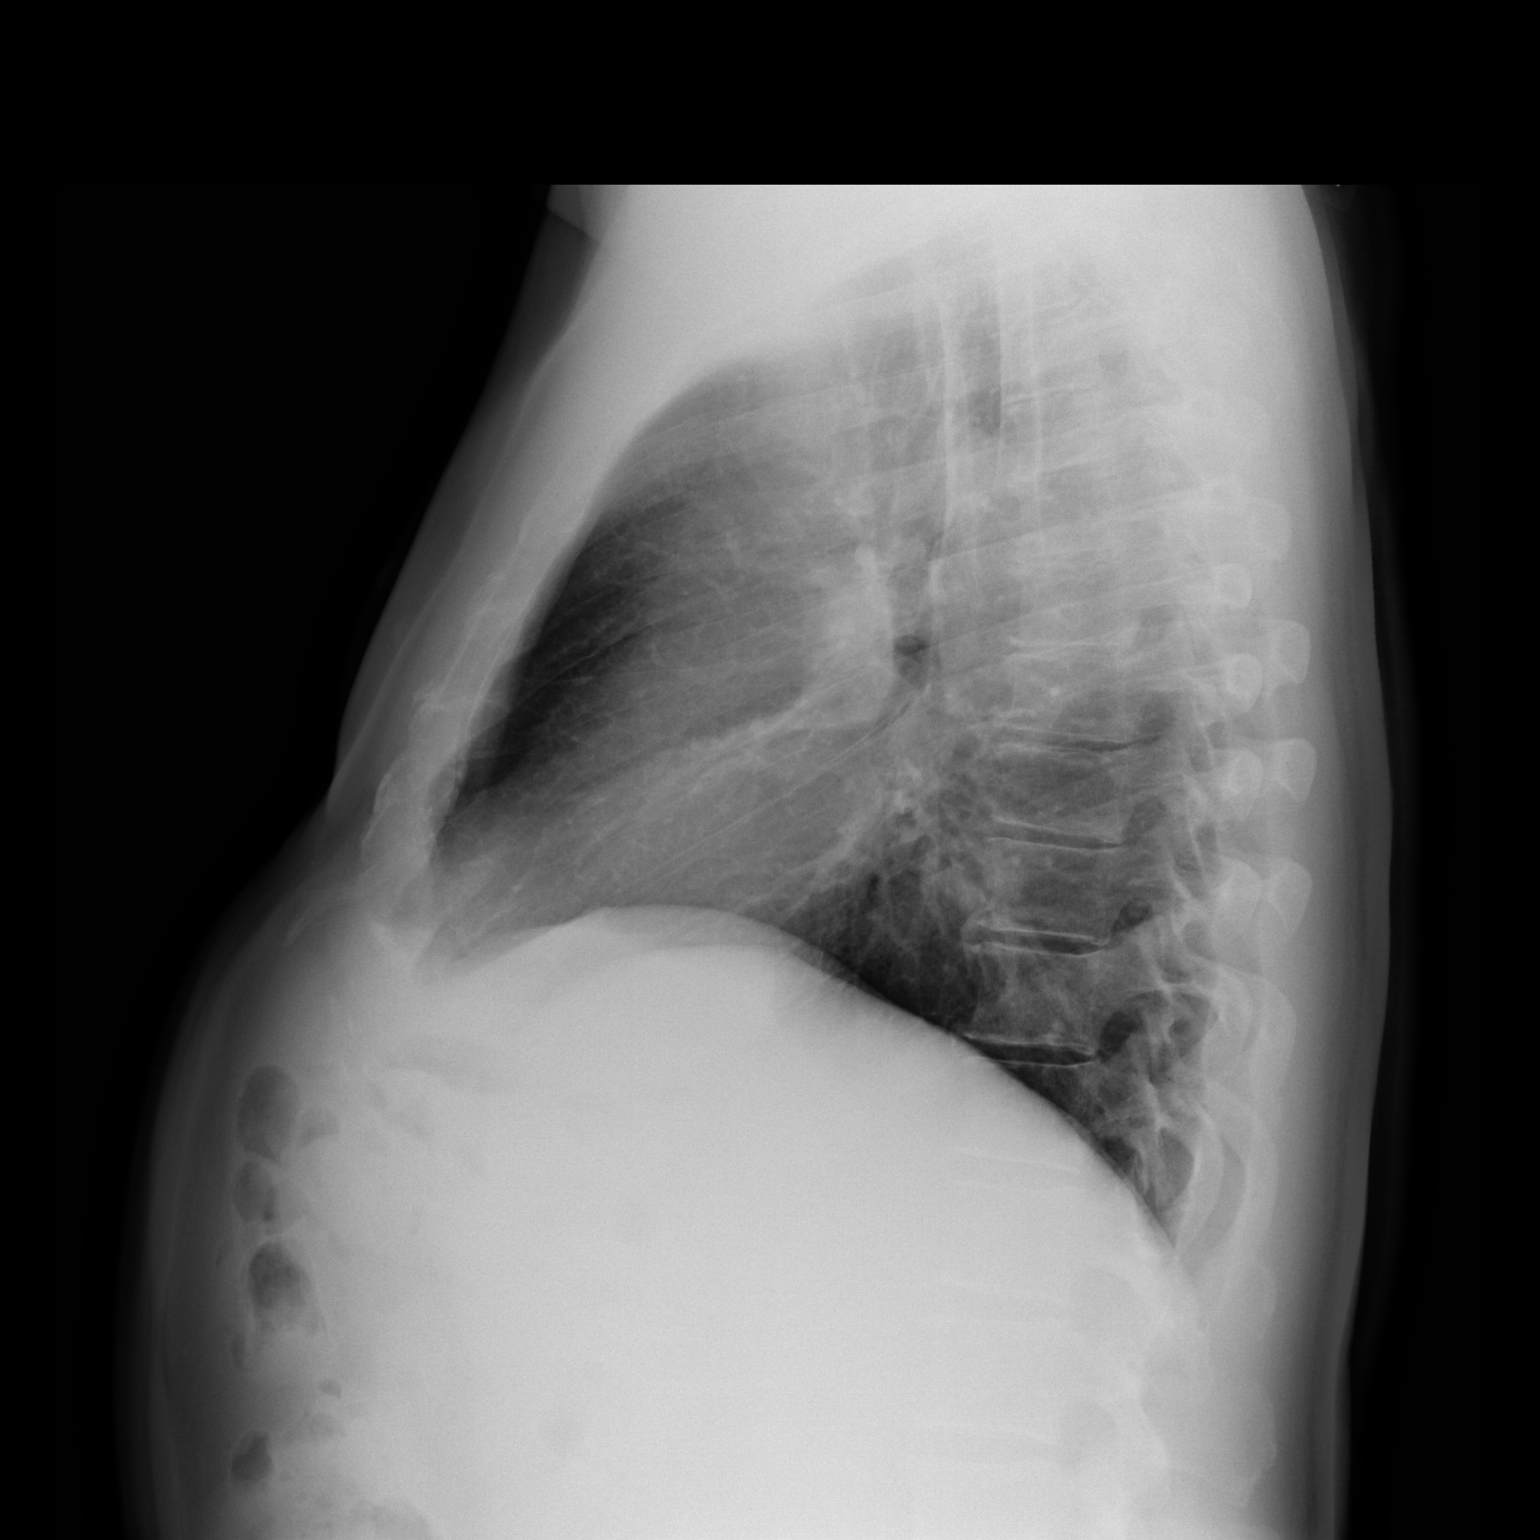

[2 of 2 positions shown; findings below may reference images not displayed]

FINDINGS: The heart size and mediastinal contours are within normal limits.
Both lungs are clear. The visualized skeletal structures are
unremarkable.
IMPRESSION: No active cardiopulmonary disease.

## 2023-11-25 ENCOUNTER — Telehealth: Payer: Self-pay | Admitting: *Deleted

## 2023-11-25 NOTE — Telephone Encounter (Signed)
 Returned PC to patient's wife Juan Garrett, no answer, left VM - she called earlier asking if Juan Garrett can have a MRI brain done around July 9 before his appointment at [facility] on July 11.  Informed Juan Garrett a MRI order is already in with an expected date of July 9, it could be done anytime the week of July 7.  Central Scheduling number given so they can schedule MRI, [phone]. Instructed her to call this office with any questions/concerns, [phone].

## 2023-12-17 ENCOUNTER — Other Ambulatory Visit: Payer: Self-pay

## 2023-12-18 ENCOUNTER — Encounter: Payer: Self-pay | Admitting: *Deleted

## 2023-12-18 ENCOUNTER — Encounter (HOSPITAL_COMMUNITY)

## 2023-12-18 ENCOUNTER — Ambulatory Visit (HOSPITAL_COMMUNITY)
Admission: RE | Admit: 2023-12-18 | Discharge: 2023-12-18 | Disposition: A | Discharge status: Home / Self Care | Source: Ambulatory Visit | Attending: Internal Medicine | Admitting: Internal Medicine

## 2023-12-18 DIAGNOSIS — C719 Malignant neoplasm of brain, unspecified: Secondary | ICD-10-CM | POA: Insufficient documentation

## 2023-12-18 MED ORDER — GADOBUTROL 1 MMOL/ML IV SOLN
7.0000 mL | Freq: Once | INTRAVENOUS | Status: AC | PRN
  Administered 2023-12-18: 7 mL via INTRAVENOUS

## 2023-12-18 NOTE — Progress Notes (Signed)
 Requested that images from today's MRI brain be shared to [facility] through [employer].

## 2023-12-19 ENCOUNTER — Inpatient Hospital Stay: Attending: Internal Medicine | Admitting: Internal Medicine

## 2023-12-19 VITALS — BP 121/60 | HR 63 | Temp 97.8°F | Resp 13 | Wt 163.3 lb

## 2023-12-19 DIAGNOSIS — C712 Malignant neoplasm of temporal lobe: Secondary | ICD-10-CM | POA: Insufficient documentation

## 2023-12-19 DIAGNOSIS — C719 Malignant neoplasm of brain, unspecified: Secondary | ICD-10-CM | POA: Diagnosis not present

## 2023-12-19 NOTE — Progress Notes (Signed)
 [facility] at [facility] [address]  [place], [place] [address] [phone]   Interval Evaluation  Date of Service: 12/19/23 Patient Name: Juan Garrett Patient MRN: [id number] Patient DOB: 09/12/1950 Provider: Arthea MARLA Manns, MD  Identifying Statement:  Juan Garrett is a 73 y.o. male with right temporal glioblastoma   Oncologic History: Oncology History  Glioblastoma, IDH-wildtype (HCC)  01/31/2023 Surgery   Craniotomy, resection of right temporal mass with Dr. Mavis; pathology demonstrates Glioblastoma, IDH-wt   04/05/2023 Surgery   Repeat right temporal resection with Dr. Saturnino at [facility]   04/29/2023 -  Radiation Therapy   IMRT with concurrent Temodar  75mg /m2   10/03/2023 Procedure   Undergoes D2C7 intratumoral infusion at [facility]     Biomarkers:  MGMT Unknown.  IDH 1/2 Wild type.  EGFR Unknown  TERT Unknown   Interval History: Juan Garrett presents today for follow up, now having completed clinical trial infusion and several vaccines at [facility].  He had complicated course following treatment, including seizures, brain hemorrhage, visual field loss, hallucinations.  Overall he is somewhat better since starting the decadron  2mg  daily.  Also taking risperdal twice per day for the hallucinations.  His vision has not improved in recent weeks, mood swings have been a problem since starting the Keppra .  Otherwise has not yet resumed chemotherapy.  Denies headaches, additional seizures.   H+P (02/28/23) Patient presented to neurologic attention in August 2024 with several weeks of progressive confusion, disorientation, and several falls.  CNS imaging demonstrated large cystic mass in right temporal lobe.  He underwent craniotomy, resection with Dr. Mavis on 01/31/23 without complication, pathology demonstrated glioblastoma.  Since surgery, he has been at home, doing well.  No issues with gait, no recurrence of confusion,  visual spatial issues.  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  Medications: Current Outpatient Medications on File Prior to Visit  Medication Sig Dispense Refill   amLODipine -benazepril  (LOTREL) 5-10 MG capsule Take 1 capsule by mouth daily.     atorvastatin  (LIPITOR) 10 MG tablet Take 10 mg by mouth daily.     famotidine  (PEPCID ) 40 MG tablet Take 40 mg by mouth daily.     Fexofenadine HCl (ALLEGRA PO) Take by mouth daily.     fluticasone  (FLONASE ) 50 MCG/ACT nasal spray Place 1 spray into both nostrils daily.     Fluticasone  Furoate (ARNUITY ELLIPTA) 50 MCG/ACT AEPB      HYDROcodone -acetaminophen  (NORCO/VICODIN) 5-325 MG tablet Take 1 tablet by mouth every 4 (four) hours as needed for moderate pain. (Patient not taking: Reported on 09/05/2023) 30 tablet 0   ibuprofen (ADVIL) 200 MG tablet Take 200 mg by mouth daily as needed for mild pain.     meclizine  (ANTIVERT ) 25 MG tablet Take 1 tablet (25 mg total) by mouth 3 (three) times daily as needed for dizziness. 30 tablet 0   ondansetron  (ZOFRAN ) 8 MG tablet Take 1 tablet (8 mg total) by mouth every 8 (eight) hours as needed for nausea or vomiting. May take 30-60 minutes prior to Temodar  administration if nausea/vomiting occurs as needed. 30 tablet 1   temozolomide  (TEMODAR ) 140 MG capsule Take 2 capsules (280 mg total) by mouth at bedtime. Take for 5 days on, then off for 23 days. Repeat every 28 days. May take on an empty stomach to decrease nausea & vomiting. (Patient not taking: Reported on 09/05/2023) 10 capsule 0   temozolomide  (TEMODAR ) 140 MG capsule Take 2 capsules (280 mg total) by  mouth at bedtime. Take for 5 days on, then off for 23 days. Repeat every 28 days. May take on an empty stomach to decrease nausea & vomiting. (Patient not taking: Reported on 09/05/2023) 10 capsule 0   Vitamin D , Ergocalciferol , (DRISDOL ) 1.25 MG (50000 UNIT) CAPS capsule Take 50,000 Units by mouth every 14 (fourteen) days.     No current facility-administered  medications on file prior to visit.    Allergies:  Allergies  Allergen Reactions   Aspirin Other (See Comments)    Upset stomach   Past Medical History:  Past Medical History:  Diagnosis Date   Allergy    seasonal allergies   Arthritis    bilateral knees   Cataract    not a surgical candidate at this time (07/06/2020)   GERD (gastroesophageal reflux disease)    on meds   Hypertension    on meds   Past Surgical History:  Past Surgical History:  Procedure Laterality Date   APPLICATION OF CRANIAL NAVIGATION N/A 01/31/2023   Procedure: APPLICATION OF CRANIAL NAVIGATION;  Surgeon: Mavis Purchase, MD;  Location: [facility] OR;  Service: Neurosurgery;  Laterality: N/A;   CARPAL TUNNEL RELEASE Bilateral 1997   CHONDROPLASTY Right 09/23/2019   Procedure: CHONDROPLASTY;  Surgeon: Jerri Kay HERO, MD;  Location: [facility];  Service: Orthopedics;  Laterality: Right;   CRANIOTOMY Right 01/31/2023   Procedure: CRANIOTOMY TUMOR EXCISION;  Surgeon: Mavis Purchase, MD;  Location: [facility] OR;  Service: Neurosurgery;  Laterality: Right;   KNEE ARTHROSCOPY WITH MEDIAL MENISECTOMY Right 09/23/2019   Procedure: RIGHT KNEE ARTHROSCOPY WITH PARTIAL MEDIAL MENISCECTOMY;  Surgeon: Jerri Kay HERO, MD;  Location: [facility];  Service: Orthopedics;  Laterality: Right;   KNEE SURGERY Left 1992   Social History:  Social History   Socioeconomic History   Marital status: Married    Spouse name: Not on file   Number of children: Not on file   Years of education: Not on file   Highest education level: Not on file  Occupational History   Not on file  Tobacco Use   Smoking status: Former    Types: Cigarettes   Smokeless tobacco: Never  Vaping Use   Vaping status: Never Used  Substance and Sexual Activity   Alcohol use: Not Currently   Drug use: Never   Sexual activity: Not on file  Other Topics Concern   Not on file  Social History Narrative   Not on file   Social Drivers of  Health   Financial Resource Strain: Low Risk  (09/30/2023)   Received [facility]   Overall Financial Resource Strain (CARDIA)    Difficulty of Paying Living Expenses: Not hard at all  Food Insecurity: No Food Insecurity (09/30/2023)   Received from [facility]   Hunger Vital Sign    Within the past 12 months, you worried that your food would run out before you got the money to buy more.: Never true    Within the past 12 months, the food you bought just didn't last and you didn't have money to get more.: Never true  Transportation Needs: No Transportation Needs (09/30/2023)   Received from [facility] - Transportation    In the past 12 months, has lack of transportation kept you from medical appointments or from getting medications?: No    Lack of Transportation (Non-Medical): No  Physical Activity: Not on file  Stress: Not on file  Social  Connections: Not on file  Intimate Partner Violence: Not At Risk (03/04/2023)   Humiliation, Afraid, Rape, and Kick questionnaire    Fear of Current or Ex-Partner: No    Emotionally Abused: No    Physically Abused: No    Sexually Abused: No   Family History:  Family History  Problem Relation Age of Onset   Colon polyps Neg Hx    Colon cancer Neg Hx    Esophageal cancer Neg Hx    Stomach cancer Neg Hx    Rectal cancer Neg Hx     Review of Systems: Constitutional: Doesn't report fevers, chills or abnormal weight loss Eyes: Doesn't report blurriness of vision Ears, nose, mouth, throat, and face: Doesn't report sore throat Respiratory: Doesn't report cough, dyspnea or wheezes Cardiovascular: Doesn't report palpitation, chest discomfort  Gastrointestinal:  Doesn't report nausea, constipation, diarrhea GU: Doesn't report incontinence Skin: Doesn't report skin rashes Neurological: Per HPI Musculoskeletal: Doesn't report joint pain Behavioral/Psych: Doesn't report  anxiety  Physical Exam: Vitals:   12/19/23 1043  BP: 121/60  Pulse: 63  Resp: 13  Temp: 97.8 F (36.6 C)  SpO2: 99%    KPS: 70. General: Alert, cooperative, pleasant, in no acute distress Head: Normal EENT: No conjunctival injection or scleral icterus.  Lungs: Resp effort normal Cardiac: Regular rate Abdomen: Non-distended abdomen Skin: No rashes cyanosis or petechiae. Extremities: No clubbing or edema  Neurologic Exam: Mental Status: Awake, alert, attentive to examiner. Oriented to self and environment. Language is fluent with intact comprehension.  Cranial Nerves: Visual acuity is grossly normal. Dense left field hemianopia. Extra-ocular movements intact. No ptosis. Face is symmetric Motor: Tone and bulk are normal. Power is full in both arms and legs. Reflexes are symmetric, no pathologic reflexes present.  Sensory: Intact to light touch Gait: Deferred   Labs: I have reviewed the data as listed    Component Value Date/Time   NA 142 09/05/2023 0912   K 4.4 09/05/2023 0912   CL 108 09/05/2023 0912   CO2 30 09/05/2023 0912   GLUCOSE 78 09/05/2023 0912   BUN 24 (H) 09/05/2023 0912   CREATININE 1.13 09/05/2023 0912   CALCIUM  9.4 09/05/2023 0912   PROT 6.8 09/05/2023 0912   ALBUMIN 4.2 09/05/2023 0912   AST 15 09/05/2023 0912   ALT 17 09/05/2023 0912   ALKPHOS 48 09/05/2023 0912   BILITOT 0.6 09/05/2023 0912   GFRNONAA >60 09/05/2023 0912   Lab Results  Component Value Date   WBC 4.3 09/05/2023   NEUTROABS 2.5 09/05/2023   HGB 14.5 09/05/2023   HCT 43.1 09/05/2023   MCV 86.0 09/05/2023   PLT 155 09/05/2023    Imaging:  [facility] Clinician Interpretation: I have personally reviewed the CNS images as listed.  My interpretation, in the context of the patient's clinical presentation, is stable disease  MR Brain W Wo Contrast Result Date: 12/18/2023 CLINICAL DATA:  Brain/CNS neoplasm, assess treatment response EXAM: MRI HEAD WITHOUT AND WITH CONTRAST TECHNIQUE:  Multiplanar, multiecho pulse sequences of the brain and surrounding structures were obtained without and with intravenous contrast. CONTRAST:  7mL GADAVIST  GADOBUTROL  1 MMOL/ML IV SOLN COMPARISON:  MRI of the head dated Nov 06, 2023. FINDINGS: Brain: The patient is status post right temporoparietal craniotomy for resection of lesion within the posterior temporal lobe. The contents of the cystic surgical cavity have become more homogeneous in signal intensity abuts similar in size in appearance otherwise, measuring approximately 5.8 x 2.6 x 2.2 cm. There is once again an  area of intraparenchymal hemorrhage and diffusion restriction seen overlying the surgical cavity, which extends to the ependymal surface of the lateral ventricle and is intrinsically hyperintense on T1. It appears fairly similar to the prior exam with extensive blooming artifact. There is no significant contrast enhancement. There has been interval progression of surrounding T2 signal hyperintensity within the right parietal, occipital and posterior frontal lobes. There is also worsening of T2 signal hyperintensity in the periventricular white matter the right frontal lobe. There is generalized cerebral volume loss. There are confluent areas of increased T2 signal in the periventricular white matter. There are few scattered foci of increased T2 signal in the left cerebral white matter. Vascular: Normal vascular flow voids. The sinuses appear to be patent. Skull and upper cervical spine: Normal bone marrow signal. Status post right temporoparietal craniotomy. Sinuses/Orbits: Clear paranasal sinuses.  Unremarkable orbits. Other: None. IMPRESSION: 1. There has been interval worsening or extension of T2 signal hyperintensity within the right cerebral hemisphere involving the frontal, parietal and occipital lobes. The cystic surgical cavity in the right temporal lobe is more homogeneously cystic, but otherwise unchanged. The overlying hemorrhage has not  changed significantly in appearance in the interim. Electronically Signed   By: Evalene Coho M.D.   On: 12/18/2023 14:40    Assessment/Plan Glioblastoma, IDH-wildtype (HCC)  Juan Garrett is clinically stable today, now having undergone D2C7 infusion and anti-CD40 injections at [facility], recent MRI brain.  Complications of this treatment included CNS hemorrhagic event with subsequent visual field impairment, in addition to seizures and psychiatric effects.  MRI brain was reviewed; it demonstrates essentially stable findings with regard to tumor and hemorrhage.  There is mildly increased edema.  We discussed goals of care and provided support today otherwise.  He will continue on study protocol with [facility] team.  He will ask about alternatives to Keppra  given his mood side effects.  We ask that Juan Garrett return to clinic in 2-3 months or as needed following next MRI study.  All questions were answered. The patient knows to call the clinic with any problems, questions or concerns. No barriers to learning were detected.  The total time spent in the encounter was 40 minutes and more than 50% was on counseling and review of test results   Arthea MARLA Manns, MD Medical Director of Neuro-Oncology [facility] at [facility] 12/19/23 10:22 AM

## 2023-12-21 ENCOUNTER — Other Ambulatory Visit: Payer: Self-pay

## 2023-12-24 ENCOUNTER — Other Ambulatory Visit: Payer: Self-pay

## 2023-12-31 ENCOUNTER — Telehealth: Payer: Self-pay | Admitting: *Deleted

## 2023-12-31 NOTE — Telephone Encounter (Signed)
-----   Message from Arthea MARLA Manns sent at 12/31/2023 12:57 PM EDT ----- Once [facility] clinical trial team decides on a date, we can order a scan and get him in to review it ----- Message ----- From: Marget Rudell ORN, RN Sent: 12/31/2023  12:56 PM EDT To: Zachary K Vaslow, MD  This patient's wife called, he would like to have another appointment with you at some point.  They found their last visit with you to be very helpful & informative.  She thought maybe you might like to  see him after his next MRI but there isn't one ordered.  Will he need one anytime soon?

## 2023-12-31 NOTE — Telephone Encounter (Signed)
 PC to patient's wife, Donny - she says patient has appointment at [facility] on 01/09/24 & they would like to see Dr Buckley after this appointment to discuss treatment options.  MD appointment scheduled for 01/13/24 at 2:30, Cathy verbalizes understanding.

## 2023-12-31 NOTE — Progress Notes (Signed)
 Protonix 20 mg and Zofran  sent [facility] on file.

## 2024-01-01 ENCOUNTER — Other Ambulatory Visit: Payer: Self-pay

## 2024-01-07 ENCOUNTER — Emergency Department (HOSPITAL_COMMUNITY)

## 2024-01-07 ENCOUNTER — Other Ambulatory Visit: Payer: Self-pay

## 2024-01-07 ENCOUNTER — Emergency Department (HOSPITAL_COMMUNITY)
Admission: EM | Admit: 2024-01-07 | Discharge: 2024-01-07 | Disposition: A | Discharge status: Other Acute Inpatient Hospital | Attending: Emergency Medicine | Admitting: Emergency Medicine

## 2024-01-07 DIAGNOSIS — S066X0A Traumatic subarachnoid hemorrhage without loss of consciousness, initial encounter: Secondary | ICD-10-CM | POA: Diagnosis not present

## 2024-01-07 DIAGNOSIS — Z85841 Personal history of malignant neoplasm of brain: Secondary | ICD-10-CM | POA: Diagnosis not present

## 2024-01-07 DIAGNOSIS — I609 Nontraumatic subarachnoid hemorrhage, unspecified: Secondary | ICD-10-CM

## 2024-01-07 DIAGNOSIS — S0990XA Unspecified injury of head, initial encounter: Secondary | ICD-10-CM | POA: Diagnosis present

## 2024-01-07 DIAGNOSIS — W01198A Fall on same level from slipping, tripping and stumbling with subsequent striking against other object, initial encounter: Secondary | ICD-10-CM | POA: Insufficient documentation

## 2024-01-07 DIAGNOSIS — Z79899 Other long term (current) drug therapy: Secondary | ICD-10-CM | POA: Insufficient documentation

## 2024-01-07 HISTORY — DX: Malignant neoplasm of brain, unspecified: C71.9

## 2024-01-07 LAB — COMPREHENSIVE METABOLIC PANEL WITH GFR
ALT: 32 U/L (ref 0–44)
AST: 36 U/L (ref 15–41)
Albumin: 3.3 g/dL — ABNORMAL LOW (ref 3.5–5.0)
Alkaline Phosphatase: 43 U/L (ref 38–126)
Anion gap: 9 (ref 5–15)
BUN: 28 mg/dL — ABNORMAL HIGH (ref 8–23)
CO2: 22 mmol/L (ref 22–32)
Calcium: 8.6 mg/dL — ABNORMAL LOW (ref 8.9–10.3)
Chloride: 106 mmol/L (ref 98–111)
Creatinine, Ser: 1.19 mg/dL (ref 0.61–1.24)
GFR, Estimated: >60 mL/min (ref >60)
Glucose, Bld: 111 mg/dL — ABNORMAL HIGH (ref 70–99)
Potassium: 3.9 mmol/L (ref 3.5–5.1)
Sodium: 137 mmol/L (ref 135–145)
Total Bilirubin: 1.1 mg/dL (ref 0.0–1.2)
Total Protein: 6 g/dL — ABNORMAL LOW (ref 6.5–8.1)

## 2024-01-07 LAB — I-STAT CHEM 8, ED
BUN: 33 mg/dL — ABNORMAL HIGH (ref 8–23)
Calcium, Ion: 1.11 mmol/L — ABNORMAL LOW (ref 1.15–1.40)
Chloride: 107 mmol/L (ref 98–111)
Creatinine, Ser: 1.2 mg/dL (ref 0.61–1.24)
Glucose, Bld: 109 mg/dL — ABNORMAL HIGH (ref 70–99)
HCT: 39 % (ref 39.0–52.0)
Hemoglobin: 13.3 g/dL (ref 13.0–17.0)
Potassium: 3.9 mmol/L (ref 3.5–5.1)
Sodium: 141 mmol/L (ref 135–145)
TCO2: 24 mmol/L (ref 22–32)

## 2024-01-07 LAB — PROTIME-INR
INR: 1 (ref 0.8–1.2)
Prothrombin Time: 13.4 s (ref 11.4–15.2)

## 2024-01-07 LAB — I-STAT CG4 LACTIC ACID, ED: Lactic Acid, Venous: 2 mmol/L (ref 0.5–1.9)

## 2024-01-07 LAB — TYPE AND SCREEN
ABO/RH(D): A NEG
Antibody Screen: NEGATIVE

## 2024-01-07 LAB — CBC
HCT: 40.8 % (ref 39.0–52.0)
Hemoglobin: 13.9 g/dL (ref 13.0–17.0)
MCH: 29.9 pg (ref 26.0–34.0)
MCHC: 34.1 g/dL (ref 30.0–36.0)
MCV: 87.7 fL (ref 80.0–100.0)
Platelets: 146 K/uL — ABNORMAL LOW (ref 150–400)
RBC: 4.65 MIL/uL (ref 4.22–5.81)
RDW: 14.5 % (ref 11.5–15.5)
WBC: 8 K/uL (ref 4.0–10.5)
nRBC: 0 % (ref 0.0–0.2)

## 2024-01-07 LAB — ETHANOL: Alcohol, Ethyl (B): <15 mg/dL (ref <15)

## 2024-01-07 NOTE — Consult Note (Signed)
 Providing Compassionate, Quality Care - Together   Reason for Consult: Subarachnoid hemorrhage Referring Physician: Dr. Dasie Sharper Lyndall Windt is an 73 y.o. male.  HPI: Mr. Biehn is a 73 year old male with a history of glioblastoma (s/p craniotomy for resection by Dr. Mavis and second craniotomy for resection of residual tumor by Dr. Saturnino on 04/05/2023), gastroesophageal reflux disease, and hypertension. His wife and daughter are at the bedside providing the history for the patient. He was at his neurologic baseline earlier today when he was folding laundry. He fell straight back, striking his head. His wife reports Mr. Colin was unconscious and she is unclear if he had a seizure prior to his fall. He is on Keppra  1000 mg BID and decadron  currently. His wife reports, Mr. Rathbone is more alert since the incident earlier today, but she is concerned he is hallucinating. Mr. Sidman has left hemianopsia since he underwent an injection as a part of his treatment at [facility]. He also has decreased movement in his left upper and lower extremities secondary to treatment for his brain tumor.  Past Medical History:  Diagnosis Date   Allergy    seasonal allergies   Arthritis    bilateral knees   Cataract    not a surgical candidate at this time (07/06/2020)   GERD (gastroesophageal reflux disease)    on meds   Hypertension    on meds    Past Surgical History:  Procedure Laterality Date   APPLICATION OF CRANIAL NAVIGATION N/A 01/31/2023   Procedure: APPLICATION OF CRANIAL NAVIGATION;  Surgeon: Mavis Purchase, MD;  [facility]: [facility] OR;  Service: Neurosurgery;  Laterality: N/A;   CARPAL TUNNEL RELEASE Bilateral 1997   CHONDROPLASTY Right 09/23/2019   Procedure: CHONDROPLASTY;  Surgeon: Jerri Kay HERO, MD;  Location: [facility];  Service: Orthopedics;  Laterality: Right;   CRANIOTOMY Right 01/31/2023   Procedure: CRANIOTOMY TUMOR EXCISION;  Surgeon: Mavis Purchase, MD;   Location: [facility] OR;  Service: Neurosurgery;  Laterality: Right;   KNEE ARTHROSCOPY WITH MEDIAL MENISECTOMY Right 09/23/2019   Procedure: RIGHT KNEE ARTHROSCOPY WITH PARTIAL MEDIAL MENISCECTOMY;  Surgeon: Jerri Kay HERO, MD;  Location: [facility];  Service: Orthopedics;  Laterality: Right;   KNEE SURGERY Left 1992    Family History  Problem Relation Age of Onset   Colon polyps Neg Hx    Colon cancer Neg Hx    Esophageal cancer Neg Hx    Stomach cancer Neg Hx    Rectal cancer Neg Hx     Social History:  reports that he has quit smoking. His smoking use included cigarettes. He has never used smokeless tobacco. He reports that he does not currently use alcohol. He reports that he does not use drugs.  Allergies:  Allergies  Allergen Reactions   Aspirin Other (See Comments)    Upset stomach    Medications: I have reviewed the patient's current medications.  Results for orders placed or performed during the hospital encounter of 01/07/24 (from the past 48 hours)  Type and screen [facility]     Status: None   Collection Time: 01/07/24  9:26 AM  Result Value Ref Range   ABO/RH(D) A NEG    Antibody Screen NEG    Sample Expiration      01/10/2024,2359 Performed at [facility], [address], [place], [place] [address]   Comprehensive metabolic panel     Status: Abnormal   Collection Time: 01/07/24  9:27 AM  Result Value Ref Range   Sodium 137 135 - 145 mmol/L   Potassium 3.9 3.5 - 5.1 mmol/L    Comment: HEMOLYSIS AT THIS LEVEL MAY AFFECT RESULT   Chloride 106 98 - 111 mmol/L   CO2 22 22 - 32 mmol/L   Glucose, Bld 111 (H) 70 - 99 mg/dL    Comment: Glucose reference range applies only to samples taken after fasting for at least 8 hours.   BUN 28 (H) 8 - 23 mg/dL   Creatinine, Ser 8.80 0.61 - 1.24 mg/dL   Calcium  8.6 (L) 8.9 - 10.3 mg/dL   Total Protein 6.0 (L) 6.5 - 8.1 g/dL   Albumin 3.3 (L) 3.5 - 5.0 g/dL   AST 36 15 - 41 U/L    Comment:  HEMOLYSIS AT THIS LEVEL MAY AFFECT RESULT   ALT 32 0 - 44 U/L    Comment: HEMOLYSIS AT THIS LEVEL MAY AFFECT RESULT   Alkaline Phosphatase 43 38 - 126 U/L   Total Bilirubin 1.1 0.0 - 1.2 mg/dL    Comment: HEMOLYSIS AT THIS LEVEL MAY AFFECT RESULT   GFR, Estimated >60 >60 mL/min    Comment: (NOTE) Calculated using the CKD-EPI Creatinine Equation (2021)    Anion gap 9 5 - 15    Comment: Performed at [facility], [address], [place], [place] [address]  CBC     Status: Abnormal   Collection Time: 01/07/24  9:27 AM  Result Value Ref Range   WBC 8.0 4.0 - 10.5 K/uL   RBC 4.65 4.22 - 5.81 MIL/uL   Hemoglobin 13.9 13.0 - 17.0 g/dL   HCT 59.1 60.9 - 47.9 %   MCV 87.7 80.0 - 100.0 fL   MCH 29.9 26.0 - 34.0 pg   MCHC 34.1 30.0 - 36.0 g/dL   RDW 85.4 88.4 - 84.4 %   Platelets 146 (L) 150 - 400 K/uL   nRBC 0.0 0.0 - 0.2 %    Comment: Performed at [facility], [address] [place], [place] [address]  Ethanol     Status: None   Collection Time: 01/07/24  9:27 AM  Result Value Ref Range   Alcohol, Ethyl (B) <15 <15 mg/dL    Comment: (NOTE) For medical purposes only. Performed at [facility], [address], [place], [place] [address]   Protime-INR     Status: None   Collection Time: 01/07/24  9:27 AM  Result Value Ref Range   Prothrombin Time 13.4 11.4 - 15.2 seconds   INR 1.0 0.8 - 1.2    Comment: (NOTE) INR goal varies based on device and disease states. Performed at [facility], [address] [place], [place] [address]   I-Stat Chem 8, ED     Status: Abnormal   Collection Time: 01/07/24  9:37 AM  Result Value Ref Range   Sodium 141 135 - 145 mmol/L   Potassium 3.9 3.5 - 5.1 mmol/L   Chloride 107 98 - 111 mmol/L   BUN 33 (H) 8 - 23 mg/dL   Creatinine, Ser 8.79 0.61 - 1.24 mg/dL   Glucose, Bld 890 (H) 70 - 99 mg/dL    Comment: Glucose reference range applies only to samples taken after fasting for at least 8 hours.   Calcium , Ion 1.11 (L)  1.15 - 1.40 mmol/L   TCO2 24 22 - 32 mmol/L   Hemoglobin 13.3 13.0 - 17.0 g/dL   HCT 39.0  39.0 - 52.0 %  I-Stat Lactic Acid, ED     Status: Abnormal   Collection Time: 01/07/24  9:38 AM  Result Value Ref Range   Lactic Acid, Venous 2.0 (HH) 0.5 - 1.9 mmol/L    CT CERVICAL SPINE WO CONTRAST Result Date: 01/07/2024 CLINICAL DATA:  Polytrauma, blunt EXAM: CT CERVICAL SPINE WITHOUT CONTRAST TECHNIQUE: Multidetector CT imaging of the cervical spine was performed without intravenous contrast. Multiplanar CT image reconstructions were also generated. RADIATION DOSE REDUCTION: This exam was performed according to the departmental dose-optimization program which includes automated exposure control, adjustment of the mA and/or kV according to patient size and/or use of iterative reconstruction technique. COMPARISON:  None Available. FINDINGS: Alignment: Mild reversal of the normal cervical lordosis. Skull base and vertebrae: No acute fracture. No primary bone lesion or focal pathologic process. Soft tissues and spinal canal: No prevertebral fluid or swelling. No visible canal hematoma. Disc levels: There is disc bulging and endplate ridging at C3-4, C4-5, C5-6 and C6-7, with mild-to-moderate central spinal canal stenosis at each level. There is also mild-to-moderate bilateral neural foraminal stenosis at C4-5, C5-6 and C6-7. Upper chest: Negative. Other: None. IMPRESSION: 1. Multilevel chronic degenerative disc disease. No evidence of acute traumatic injury. Electronically Signed   By: Evalene Coho M.D.   On: 01/07/2024 10:18   CT HEAD WO CONTRAST Result Date: 01/07/2024 CLINICAL DATA:  Head trauma.  Patient has history of glioblastoma. EXAM: CT HEAD WITHOUT CONTRAST TECHNIQUE: Contiguous axial images were obtained from the base of the skull through the vertex without intravenous contrast. RADIATION DOSE REDUCTION: This exam was performed according to the departmental dose-optimization program which  includes automated exposure control, adjustment of the mA and/or kV according to patient size and/or use of iterative reconstruction technique. COMPARISON:  MRI of the head dated December 18, 2023. FINDINGS: Brain: Since the previous study, the patient has developed extensive subarachnoid hemorrhage overlying the left temporal and frontal lobes. There is a small subdural hematoma present anteriorly within the left middle cranial fossa and images 12 and 13 of series 2. There is also probable small hemorrhagic contusion within the left temporal lobe on image 9. There is also intracranial air present within the left middle cranial fossa primarily. A large surgical resection cavity is again demonstrated within the right temporal lobe with overlying craniotomy. There is no definite acute traumatic injury of the right cerebral hemisphere. Vascular: Moderate vascular calcifications. Skull: Status post right temporoparietal craniotomy. There is no definite acute fracture, but an occult fracture is presumably present given the intracranial air. Sinuses/Orbits: Near complete opacification of the sphenoid sinuses. The orbits are unremarkable. Other: None. IMPRESSION: 1. Interval development of extensive left-sided subarachnoid hemorrhage. There is also a small subdural hematoma present anteriorly within the middle cranial fossa and there is likely a small hemorrhagic contusion of the left temporal lobe. 2.  Traumatic Brain Injury Risk Stratification Skull Fracture: Suspected occult nondisplaced fracture Subdural Hematoma (SDH): <55mm - mBIG 1 Subarachnoid Hemorrhage [facility]): localized, unilateral - Moderate/mBIG 2 Epidural Hematoma (EDH): No - Low/mBIG 1 Cerebral contusion, intra-axial, intraparenchymal Hemorrhage (IPH): solitary, <32mm - Low/mBIG 1 Intraventricular Hemorrhage (IVH): No - Low/mBIG 1 Midline Shift > 1mm or Edema/effacement of sulci/vents: No - Low/mBIG 1 ---------------------------------------------------- These results  were called by telephone at the time of interpretation on 01/07/2024 at 10:06 am to provider Dr. CURTISTINE DAWN , who verbally acknowledged these results. Electronically Signed   By: Evalene Coho M.D.   On: 01/07/2024 10:16  Review of Systems  Unable to perform ROS: Mental status change   Blood pressure 120/67, pulse (!) 59, temperature (!) 97.4 F (36.3 C), temperature source Temporal, resp. rate 15, SpO2 98%. Estimated body mass index is 27.17 kg/m as calculated from the following:   Height as of 07/08/23: 5' 5 (1.651 m).   Weight as of 12/19/23: 74.1 kg.  Physical Exam Constitutional:      Appearance: He is ill-appearing.  HENT:     Right Ear: Drainage present.     Nose: Nose normal.     Mouth/Throat:     Mouth: Mucous membranes are moist.     Pharynx: Oropharynx is clear.  Eyes:     General: Visual field deficit present.     Pupils: Pupils are equal, round, and reactive to light.  Cardiovascular:     Rate and Rhythm: Regular rhythm. Bradycardia present.  Pulmonary:     Effort: Pulmonary effort is normal. No respiratory distress.  Abdominal:     Palpations: Abdomen is soft.  Musculoskeletal:     Cervical back: Normal range of motion and neck supple.     Comments: Only able to get patient to move his right upper extremity during exam.  Skin:    General: Skin is warm and dry.  Neurological:     Mental Status: He is lethargic and confused.     GCS: GCS eye subscore is 3. GCS verbal subscore is 4. GCS motor subscore is 5.     Comments: Oriented to self  Psychiatric:        Attention and Perception: He perceives visual hallucinations.        Speech: Speech is delayed and slurred.        Behavior: Behavior is actively hallucinating.        Cognition and Memory: Cognition is impaired.     Assessment/Plan: Patient suffered a fall earlier today, sustaining left-sided traumatic subarachnoid hemorrhage and likely occult skull fracture.The plan is for the patient to  transfer to [facility] today, where he is currently receiving his cancer treatment. Recommend repeat CT head in 24 hours.  I am in communication with my attending and they agree with the plan for this patient.   Gerard Beck, DNP, AGNP-C Nurse Practitioner  [facility] [address], [place], [place] [address] P: [phone]    F: [phone]  01/07/2024, 11:30 AM

## 2024-01-07 NOTE — ED Notes (Signed)
 Trauma Response Nurse Documentation   Juan Garrett is a 73 y.o. male arriving to [facility] ED via EMS  On No antithrombotic. Trauma was activated as a Level 2 by ED Charge RN based on the following trauma criteria {Trauma criteria:26865}.  Patient cleared for CT by Dr. FERNAND Pt transported to {TRN Radiology:26861::CT} with trauma response nurse present to monitor. RN remained with the patient throughout their absence from the department for clinical observation.   GCS ***.  Trauma MD Arrival Time: ***.  History   Past Medical History:  Diagnosis Date   Allergy    seasonal allergies   Arthritis    bilateral knees   Cataract    not a surgical candidate at this time (07/06/2020)   GERD (gastroesophageal reflux disease)    on meds   Hypertension    on meds     Past Surgical History:  Procedure Laterality Date   APPLICATION OF CRANIAL NAVIGATION N/A 01/31/2023   Procedure: APPLICATION OF CRANIAL NAVIGATION;  Surgeon: Mavis Purchase, MD;  Location: [facility] OR;  Service: Neurosurgery;  Laterality: N/A;   CARPAL TUNNEL RELEASE Bilateral 1997   CHONDROPLASTY Right 09/23/2019   Procedure: CHONDROPLASTY;  Surgeon: Jerri Kay HERO, MD;  Location: [facility];  Service: Orthopedics;  Laterality: Right;   CRANIOTOMY Right 01/31/2023   Procedure: CRANIOTOMY TUMOR EXCISION;  Surgeon: Mavis Purchase, MD;  Location: [facility] OR;  Service: Neurosurgery;  Laterality: Right;   KNEE ARTHROSCOPY WITH MEDIAL MENISECTOMY Right 09/23/2019   Procedure: RIGHT KNEE ARTHROSCOPY WITH PARTIAL MEDIAL MENISCECTOMY;  Surgeon: Jerri Kay HERO, MD;  Location: [facility];  Service: Orthopedics;  Laterality: Right;   KNEE SURGERY Left 1992       Initial Focused Assessment (If applicable, or please see trauma documentation):   CT's Completed:   {Trauma CT:26866}   Interventions:   Plan for disposition:  {Trauma Dispo:26867}   Consults completed:  {Trauma Consults:26862} at  ***.  Event Summary:  MTP Summary (If applicable):   Bedside handoff with {Trauma handoff:26863::ED RN} ***.    Juan Garrett  Trauma Response RN  Please call TRN at [phone] for further assistance.

## 2024-01-07 NOTE — ED Triage Notes (Addendum)
 Pt bib [facility] for a fall. No blood thinners. [place] one hour ago, struck back of head on brick fireplace. Initially A&Ox4, blood coming from nose and right ear. Pt became unresponsive with ems and GCS of 10. Pt has glioblastoma. 20g left hand, cervical collar in place

## 2024-01-07 NOTE — ED Provider Notes (Signed)
 [place] EMERGENCY DEPARTMENT AT [facility] Provider Note   CSN: [id number] Arrival date & time: 01/07/24  9081     Patient presents with: Juan Garrett is a 73 y.o. male.   73 year old male presents after witnessed fall at home.  Patient has a history of brain cancer.  He is according to EMS on medication that makes his brain swell.  Patient fell approximate hour ago and struck the back of his head on the fireplace.  Positive LOC.  Patient became less responsive per EMS.  Had a GCS of 10.  No further history obtainable due to his current state       Prior to Admission medications   Medication Sig Start Date End Date Taking? Authorizing Provider  amLODipine -benazepril  (LOTREL) 5-10 MG capsule Take 1 capsule by mouth daily. 08/17/19   [provider]  atorvastatin  (LIPITOR) 10 MG tablet Take 10 mg by mouth daily.    [provider]  dexamethasone  (DECADRON ) 2 MG tablet Take 2 mg by mouth every morning. 11/17/23   [provider]  famotidine  (PEPCID ) 40 MG tablet Take 40 mg by mouth daily. 07/31/19   [provider]  Fexofenadine HCl (ALLEGRA PO) Take by mouth daily.    [provider]  fluticasone  (FLONASE ) 50 MCG/ACT nasal spray Place 1 spray into both nostrils daily. 11/27/22   [provider]  Fluticasone  Furoate (ARNUITY ELLIPTA) 50 MCG/ACT AEPB     [provider]  HYDROcodone -acetaminophen  (NORCO/VICODIN) 5-325 MG tablet Take 1 tablet by mouth every 4 (four) hours as needed for moderate pain. Patient not taking: Reported on 09/05/2023 02/02/23   Meyran, Kimberly Hannah, NP  ibuprofen (ADVIL) 200 MG tablet Take 200 mg by mouth daily as needed for mild pain.    [provider]  levETIRAcetam  (KEPPRA ) 1000 MG tablet Take 1,000 mg by mouth 2 (two) times daily.    [provider]  meclizine  (ANTIVERT ) 25 MG tablet Take 1 tablet (25 mg total) by mouth 3 (three) times daily as needed  for dizziness. Patient not taking: Reported on 12/19/2023 08/06/23   Vaslow, Zachary K, MD  ondansetron  (ZOFRAN ) 8 MG tablet Take 1 tablet (8 mg total) by mouth every 8 (eight) hours as needed for nausea or vomiting. May take 30-60 minutes prior to Temodar  administration if nausea/vomiting occurs as needed. Patient not taking: Reported on 12/19/2023 03/07/23   Vaslow, Zachary K, MD  risperiDONE (RISPERDAL) 0.5 MG tablet Take 0.5 mg by mouth 2 (two) times daily. 12/18/23 12/17/24  [provider]  temozolomide  (TEMODAR ) 140 MG capsule Take 2 capsules (280 mg total) by mouth at bedtime. Take for 5 days on, then off for 23 days. Repeat every 28 days. May take on an empty stomach to decrease nausea & vomiting. Patient not taking: Reported on 12/19/2023 07/10/23   Vaslow, Zachary K, MD  temozolomide  (TEMODAR ) 140 MG capsule Take 2 capsules (280 mg total) by mouth at bedtime. Take for 5 days on, then off for 23 days. Repeat every 28 days. May take on an empty stomach to decrease nausea & vomiting. Patient not taking: Reported on 12/19/2023 08/05/23   Vaslow, Zachary K, MD  Vitamin D , Ergocalciferol , (DRISDOL ) 1.25 MG (50000 UNIT) CAPS capsule Take 50,000 Units by mouth every 14 (fourteen) days. 05/29/19   [provider]    Allergies: Aspirin    Review of Systems  Unable to perform ROS: Acuity of condition    Updated Vital Signs BP ROLLEN)  112/58   Physical Exam Vitals and nursing note reviewed.  Constitutional:      General: He is not in acute distress.    Appearance: Normal appearance. He is well-developed. He is not toxic-appearing.  HENT:     Head: Normocephalic and atraumatic.  Eyes:     General: Lids are normal.     Conjunctiva/sclera: Conjunctivae normal.     Pupils: Pupils are equal, round, and reactive to light.  Neck:     Thyroid : No thyroid  mass.     Trachea: No tracheal deviation.  Cardiovascular:     Rate and Rhythm: Normal rate and regular rhythm.     Heart sounds:  Normal heart sounds. No murmur heard.    No gallop.  Pulmonary:     Effort: Pulmonary effort is normal. No respiratory distress.     Breath sounds: Normal breath sounds. No stridor. No decreased breath sounds, wheezing, rhonchi or rales.  Abdominal:     General: There is no distension.     Palpations: Abdomen is soft.     Tenderness: There is no abdominal tenderness. There is no rebound.  Musculoskeletal:        General: No tenderness. Normal range of motion.     Cervical back: Normal range of motion and neck supple.  Skin:    General: Skin is warm and dry.     Findings: No abrasion or rash.  Neurological:     Mental Status: He is lethargic and disoriented.     GCS: GCS eye subscore is 3. GCS verbal subscore is 3. GCS motor subscore is 4.     Motor: No tremor.  Psychiatric:        Attention and Perception: He is inattentive.     (all labs ordered are listed, but only abnormal results are displayed) Labs Reviewed  COMPREHENSIVE METABOLIC PANEL WITH GFR  CBC  ETHANOL  URINALYSIS, ROUTINE W REFLEX MICROSCOPIC  PROTIME-INR  I-STAT CHEM 8, ED  I-STAT CG4 LACTIC ACID, ED  SAMPLE TO BLOOD BANK  TYPE AND SCREEN    EKG: EKG Interpretation Date/Time:  Tuesday January 07 2024 09:37:30 EDT Ventricular Rate:  64 PR Interval:  223 QRS Duration:  97 QT Interval:  429 QTC Calculation: 443 R Axis:   -4  Text Interpretation: Sinus rhythm Prolonged PR interval Borderline low voltage, extremity leads Confirmed by Dasie Faden ([id number]) on 01/07/2024 10:35:56 AM  Radiology: No results found.   Procedures   Medications Ordered in the ED - No data to display                                  Medical Decision Making Amount and/or Complexity of Data Reviewed Labs: ordered. Radiology: ordered.   9:51 AM Spoke with wife who is at bedside.  She states that patient was standing and folding clothing when he fell backwards and struck his head.  He does have a history of seizure  disorder and does take Keppra .  He was at his baseline state of health this morning.  No reported seizure activity noted today however her daughter was concerned that he may have had a seizure earlier this week.  On reassessment at this time, patient's mental status has improved.  He is not following commands and moving all extremities.  Eyes are open.  He is making verbal statements.  His GCS has improved to 12  10:36 AM  Patient sensorium has improved  continuously.  Patient seen by trauma surgery who was contacted [facility] about possible transfer.  I discussed the case with the neurosurgeon on-call here, Dr. Mavis, who recommends repeat CAT scan in the morning.  No acute intervention at this time.  11:29 AM Per trauma surgery, patient has been accepted at [facility]    Final diagnoses:  None    ED Discharge Orders     None          Dasie Faden, MD 01/07/24 1129

## 2024-01-07 NOTE — Progress Notes (Signed)
 Pt fell struck head.  Pt. Gone to CT for scan.  Chaplain available as needed.   Rayleen Dade, [place], [facility], Pager [phone]

## 2024-01-08 ENCOUNTER — Encounter (HOSPITAL_COMMUNITY): Payer: Self-pay | Admitting: General Surgery

## 2024-01-08 NOTE — Consult Note (Signed)
 LATE Entry  Juan Garrett 07/28/1950  [id number].    Requesting MD: Dr. Curtistine Dawn Chief Complaint/Reason for Consult: upgrade level I secondary to low GCS  HPI:  This is a 73 yo male with a history of a glioblastoma who has undergone resection here by Dr. Mavis as well as at [facility] last October.  He is currently follow by [facility] oncology as he is in a trial study there.  He has an intracranial access and is currently receiving an immunoglobulin type medication in this study per the wife.  He has some baseline L-sided deficits.  He was helping his wife fold a blanket and lost his balance and fell.  He hit the left side of his head.  He has been confused since this time.  On arrival there was some concern for a decrease in his GCS to a 9 after having his imaging completed.  He was upgraded to a level 1 at that time.  Upon my arrival the patient was able to speak, follow commands, eyes open with a GCS of 15, however he was still a bit confused appearing and weary of what was going on around him.  He was noted to have serosang output from his right ear.  We were involved due to the upgrade.  ROS: ROS: see HPI, otherwise information mostly obtained from wife as patient not talking much or unsure of answers.  Family History  Problem Relation Age of Onset   Colon polyps Neg Hx    Colon cancer Neg Hx    Esophageal cancer Neg Hx    Stomach cancer Neg Hx    Rectal cancer Neg Hx     Past Medical History:  Diagnosis Date   Allergy    seasonal allergies   Arthritis    bilateral knees   Cataract    not a surgical candidate at this time (07/06/2020)   GERD (gastroesophageal reflux disease)    on meds   Glioblastoma (HCC)    Hypertension    on meds    Past Surgical History:  Procedure Laterality Date   APPLICATION OF CRANIAL NAVIGATION N/A 01/31/2023   Procedure: APPLICATION OF CRANIAL NAVIGATION;  Surgeon: Mavis Purchase, MD;  Location: [facility] OR;  Service: Neurosurgery;   Laterality: N/A;   CARPAL TUNNEL RELEASE Bilateral 1997   CHONDROPLASTY Right 09/23/2019   Procedure: CHONDROPLASTY;  Surgeon: Jerri Kay HERO, MD;  Location: [facility];  Service: Orthopedics;  Laterality: Right;   CRANIOTOMY Right 01/31/2023   Procedure: CRANIOTOMY TUMOR EXCISION;  Surgeon: Mavis Purchase, MD;  Location: [facility] OR;  Service: Neurosurgery;  Laterality: Right;   KNEE ARTHROSCOPY WITH MEDIAL MENISECTOMY Right 09/23/2019   Procedure: RIGHT KNEE ARTHROSCOPY WITH PARTIAL MEDIAL MENISCECTOMY;  Surgeon: Jerri Kay HERO, MD;  Location: [facility];  Service: Orthopedics;  Laterality: Right;   KNEE SURGERY Left 1992    Social History:  reports that he has quit smoking. His smoking use included cigarettes. He has never used smokeless tobacco. He reports that he does not currently use alcohol. He reports that he does not use drugs.  Allergies:  Allergies  Allergen Reactions   Aspirin Other (See Comments)    Upset stomach    (Not in a hospital admission)    Physical Exam: Blood pressure 131/66, pulse 72, temperature (!) 97.3 F (36.3 C), resp. rate 16, SpO2 100%. General: pleasant, WD, white male who is laying in bed in NAD, but clearly concussed and weary of his surroundings.  HEENT: head is normocephalic, small abrasion to left parietal region.  Scars notes on right side of his head from previous surgeries.  Sclera are noninjected.  PERRL.  Ears and nose without any masses or lesions, but serosang output was noted from R ear.  Attempted evaluation but exam unable to be completed due to presence of bloody fluid blocking his TM.  Mouth is pink and dry with some dry blood around his lips.  Some dried blood on his tongue but no obvious laceration that I could see. Heart: regular, rate, and rhythm.  Normal s1,s2. No obvious murmurs, gallops, or rubs noted.  Palpable radial and pedal pulses bilaterally Lungs: CTAB, no wheezes, rhonchi, or rales noted.  Respiratory  effort nonlabored Abd: soft, NT, ND, +BS, no masses, hernias, or organomegaly MS: all 4 extremities are symmetrical with no cyanosis, clubbing, or edema. ROM was mostly baseline pending his known L-sided deficits.  See neuro Skin: warm and dry with no masses, lesions, or rashes Neuro: concussed, answers questions appropriately if he knows the answer, but not fully oriented.  Follows commands in all extremities.  Eyes are open spontaneously. GCS 15 though. Good shoulder shrug, sticks tongue out with no deviation.  Decrease strength and motion in LUE and LLE, slightly worse than baseline per wife.  Good strength and ROM in RUE and RLEs.  Normal sensation. Psych: Alet but not fully oriented to year, situation, place.  He was oriented to his self and his family around him.   Results for orders placed or performed during the hospital encounter of 01/07/24 (from the past 48 hours)  Type and screen [facility]     Status: None   Collection Time: 01/07/24  9:26 AM  Result Value Ref Range   ABO/RH(D) A NEG    Antibody Screen NEG    Sample Expiration      01/10/2024,2359 Performed at [facility], [address] [place], [place] [address]   Comprehensive metabolic panel     Status: Abnormal   Collection Time: 01/07/24  9:27 AM  Result Value Ref Range   Sodium 137 135 - 145 mmol/L   Potassium 3.9 3.5 - 5.1 mmol/L    Comment: HEMOLYSIS AT THIS LEVEL MAY AFFECT RESULT   Chloride 106 98 - 111 mmol/L   CO2 22 22 - 32 mmol/L   Glucose, Bld 111 (H) 70 - 99 mg/dL    Comment: Glucose reference range applies only to samples taken after fasting for at least 8 hours.   BUN 28 (H) 8 - 23 mg/dL   Creatinine, Ser 8.80 0.61 - 1.24 mg/dL   Calcium  8.6 (L) 8.9 - 10.3 mg/dL   Total Protein 6.0 (L) 6.5 - 8.1 g/dL   Albumin 3.3 (L) 3.5 - 5.0 g/dL   AST 36 15 - 41 U/L    Comment: HEMOLYSIS AT THIS LEVEL MAY AFFECT RESULT   ALT 32 0 - 44 U/L    Comment: HEMOLYSIS AT THIS LEVEL MAY AFFECT  RESULT   Alkaline Phosphatase 43 38 - 126 U/L   Total Bilirubin 1.1 0.0 - 1.2 mg/dL    Comment: HEMOLYSIS AT THIS LEVEL MAY AFFECT RESULT   GFR, Estimated >60 >60 mL/min    Comment: (NOTE) Calculated using the CKD-EPI Creatinine Equation (2021)    Anion gap 9 5 - 15    Comment: Performed at [facility], [address], [place], [place] [address]  CBC     Status: Abnormal  Collection Time: 01/07/24  9:27 AM  Result Value Ref Range   WBC 8.0 4.0 - 10.5 K/uL   RBC 4.65 4.22 - 5.81 MIL/uL   Hemoglobin 13.9 13.0 - 17.0 g/dL   HCT 59.1 60.9 - 47.9 %   MCV 87.7 80.0 - 100.0 fL   MCH 29.9 26.0 - 34.0 pg   MCHC 34.1 30.0 - 36.0 g/dL   RDW 85.4 88.4 - 84.4 %   Platelets 146 (L) 150 - 400 K/uL   nRBC 0.0 0.0 - 0.2 %    Comment: Performed at [facility], [address], [place], [place] [address]  Ethanol     Status: None   Collection Time: 01/07/24  9:27 AM  Result Value Ref Range   Alcohol, Ethyl (B) <15 <15 mg/dL    Comment: (NOTE) For medical purposes only. Performed at [facility], [address], [place], [place] [address]   Protime-INR     Status: None   Collection Time: 01/07/24  9:27 AM  Result Value Ref Range   Prothrombin Time 13.4 11.4 - 15.2 seconds   INR 1.0 0.8 - 1.2    Comment: (NOTE) INR goal varies based on device and disease states. Performed at [facility], [address] [place], [place] [address]   I-Stat Chem 8, ED     Status: Abnormal   Collection Time: 01/07/24  9:37 AM  Result Value Ref Range   Sodium 141 135 - 145 mmol/L   Potassium 3.9 3.5 - 5.1 mmol/L   Chloride 107 98 - 111 mmol/L   BUN 33 (H) 8 - 23 mg/dL   Creatinine, Ser 8.79 0.61 - 1.24 mg/dL   Glucose, Bld 890 (H) 70 - 99 mg/dL    Comment: Glucose reference range applies only to samples taken after fasting for at least 8 hours.   Calcium , Ion 1.11 (L) 1.15 - 1.40 mmol/L   TCO2 24 22 - 32 mmol/L   Hemoglobin 13.3 13.0 - 17.0 g/dL   HCT 60.9 60.9 - 47.9 %   I-Stat Lactic Acid, ED     Status: Abnormal   Collection Time: 01/07/24  9:38 AM  Result Value Ref Range   Lactic Acid, Venous 2.0 (HH) 0.5 - 1.9 mmol/L   CT MAXILLOFACIAL WO CONTRAST Result Date: 01/07/2024 CLINICAL DATA:  73 year old male status post fall, striking head on brick fireplace. Intracranial hemorrhage and pneumocephalus. Hemorrhage from the nose and right ear. Known glioblastoma patient. EXAM: CT MAXILLOFACIAL WITHOUT CONTRAST TECHNIQUE: Multidetector CT imaging of the maxillofacial structures was performed. Multiplanar CT image reconstructions were also generated. RADIATION DOSE REDUCTION: This exam was performed according to the departmental dose-optimization program which includes automated exposure control, adjustment of the mA and/or kV according to patient size and/or use of iterative reconstruction technique. COMPARISON:  CT head and temporal bone today reported separately. FINDINGS: Osseous: Curvilinear fracture through the floor of the right middle cranial fossa affecting both the medial and lateral sphenoid bone. This appears to extend to the lateral right sphenoid sinus, with hemorrhage in both sphenoid sinuses. Superimposed right temporal bone fracture. And furthermore, mild step-off and irregularity along the components of the previous right side craniotomy, not previously visible on CT (post treatment MRI only). Despite right side TMJ articular surface fracture involvement the mandible remains intact and aligned. No maxilla, zygoma, pterygoid, or nasal bone fracture identified. Orbits: No orbital wall fracture. Orbits soft tissues appears symmetric and intact. Sinuses: Hemorrhage throughout both sphenoid sinuses.  And it appears the midline sphenoid sinus septum is fractured on series 7, image 48. No other left sphenoid or left middle cranial fossa fracture identified. Other paranasal sinuses are better aerated. Middle ears and mastoids detailed on temporal bone CT today  separately. Soft tissues: Negative visible noncontrast larynx, pharynx, parapharyngeal spaces, retropharyngeal space, sublingual space, submandibular spaces, masticator and parotid spaces. Limited intracranial: Partially visible posttraumatic pneumocephalus and posttraumatic combined subarachnoid and subdural hemorrhage which is mostly in the left hemisphere. Chronic glioblastoma post treatment changes in the right hemisphere. IMPRESSION: 1. Right Temporal bone fracture detailed separately. Comminuted right middle cranial fossa fracture affecting the central sphenoid and sphenoid wing. And the sphenoid sinus septum in the midline also appears fractured. Furthermore, there could be acute posttraumatic fracture through the previous Right lateral craniotomy - which is situated between the right temporal bone and middle cranial fossa fracture sites, uncertain. No left middle cranial fossa or other acute facial fracture identified. 2. Hemorrhage within the sphenoid sinuses. Hemorrhage in the right middle ear and mastoids (detailed separately). 3. Partially visible acute posttraumatic intracranial hemorrhage and pneumocephalus, mostly in the left hemisphere. And glioblastoma post treatment changes in the right hemisphere. Electronically Signed   By: VEAR Hurst M.D.   On: 01/07/2024 12:35   CT TEMPORAL BONES WO CONTRAST Result Date: 01/07/2024 CLINICAL DATA:  73 year old male status post fall, striking head on brick fireplace. Intracranial hemorrhage and pneumocephalus. Hemorrhage from the nose and right ear. Known glioblastoma patient. EXAM: CT TEMPORAL BONES WITHOUT CONTRAST TECHNIQUE: Axial and coronal plane CT imaging of the petrous temporal bones was performed with thin-collimation image reconstruction. No intravenous contrast was administered. Multiplanar CT image reconstructions were also generated. RADIATION DOSE REDUCTION: This exam was performed according to the departmental dose-optimization program which  includes automated exposure control, adjustment of the mA and/or kV according to patient size and/or use of iterative reconstruction technique. COMPARISON:  Most recent restaging brain MRI 12/18/2023. Face CT today reported separately. Head and cervical spine CT today 0934 hours. FINDINGS: RIGHT TEMPORAL BONE Acute right temporal bone fracture visible on series 5, image 44. Somewhat transversely oriented fracture which tracks to the level of the ossicles, and there does appear to be mild displacement of the head of the malleus from the incus on series 5, image 46. Hemorrhage in the right middle ear, mastoids, right external auditory canal. Fracture tracks into the posterior and medial right TMJ as seen on series 5, image 32. And comminuted right middle cranial fossa fracture with small volume adjacent pneumocephalus is also visible on series 5, images 37 and 44. This tracks to the margins of the right temporal craniotomy. See face CT today reported separately. Right otic capsule appears to remain intact. Right internal auditory canal, cochlea, vestibule, vestibular aqueduct within normal limits. Right side semicircular canals appear intact with thinning of bone over the superior semicircular canal (series 9, image 163. LEFT TEMPORAL BONE No left-side temporal bone fracture identified, although partially opacified left tympanic cavity and mastoids. Left-side ossicles appear to remain intact, lined. Left external auditory canal relatively well aerated. Left otic capsule appears intact. Left IAC, cochlea, vestibule, vestibular aqueduct within normal limits. Thinning of bone over the left superior semicircular canal (series 10, image 157), but otherwise the left-side semicircular canals appear normal. Vascular: Calcified atherosclerosis at the skull base. Limited intracranial: Left-side combined subdural and subarachnoid blood redemonstrated. Visible hemorrhage not significantly changed from 0934 hours today. Mild  rightward midline shift series 3, image 41. Scattered pneumocephalus which  is most pronounced in the left middle cranial fossa, left of midline has not significantly changed. Right hemisphere post treatment findings again noted. Basilar cisterns remain patent. Visible orbits/paranasal sinuses: Hemorrhage in the sphenoid sinuses. Other paranasal sinuses well aerated. Leftward gaze. Soft tissues: Mild right scalp convexity soft tissue swelling which is nonspecific given previous craniotomy. IMPRESSION: 1. Comminuted Acute Right skull base fracture, including the right middle cranial fossa and sphenoid (See Face CT reported separately) and the Right Temporal Bone. Right temporal bone fracture is comminuted and traverses the ossicles with mild dislocation of the malleus and incus. Right otic capsule appears to remain intact. Hemorrhage in the right middle ear, mastoids, external auditory canal. 2. No contralateral left temporal bone fracture identified, although partially opacified left middle ear and mastoids also. 3. Partially visible acute posttraumatic left hemisphere intracranial hemorrhage, posttraumatic pneumocephalus, superimposed on right hemisphere post glioblastoma treatment changes. Electronically Signed   By: VEAR Hurst M.D.   On: 01/07/2024 12:27   CT CERVICAL SPINE WO CONTRAST Result Date: 01/07/2024 CLINICAL DATA:  Polytrauma, blunt EXAM: CT CERVICAL SPINE WITHOUT CONTRAST TECHNIQUE: Multidetector CT imaging of the cervical spine was performed without intravenous contrast. Multiplanar CT image reconstructions were also generated. RADIATION DOSE REDUCTION: This exam was performed according to the departmental dose-optimization program which includes automated exposure control, adjustment of the mA and/or kV according to patient size and/or use of iterative reconstruction technique. COMPARISON:  None Available. FINDINGS: Alignment: Mild reversal of the normal cervical lordosis. Skull base and vertebrae: No  acute fracture. No primary bone lesion or focal pathologic process. Soft tissues and spinal canal: No prevertebral fluid or swelling. No visible canal hematoma. Disc levels: There is disc bulging and endplate ridging at C3-4, C4-5, C5-6 and C6-7, with mild-to-moderate central spinal canal stenosis at each level. There is also mild-to-moderate bilateral neural foraminal stenosis at C4-5, C5-6 and C6-7. Upper chest: Negative. Other: None. IMPRESSION: 1. Multilevel chronic degenerative disc disease. No evidence of acute traumatic injury. Electronically Signed   By: Evalene Coho M.D.   On: 01/07/2024 10:18   CT HEAD WO CONTRAST Result Date: 01/07/2024 CLINICAL DATA:  Head trauma.  Patient has history of glioblastoma. EXAM: CT HEAD WITHOUT CONTRAST TECHNIQUE: Contiguous axial images were obtained from the base of the skull through the vertex without intravenous contrast. RADIATION DOSE REDUCTION: This exam was performed according to the departmental dose-optimization program which includes automated exposure control, adjustment of the mA and/or kV according to patient size and/or use of iterative reconstruction technique. COMPARISON:  MRI of the head dated December 18, 2023. FINDINGS: Brain: Since the previous study, the patient has developed extensive subarachnoid hemorrhage overlying the left temporal and frontal lobes. There is a small subdural hematoma present anteriorly within the left middle cranial fossa and images 12 and 13 of series 2. There is also probable small hemorrhagic contusion within the left temporal lobe on image 9. There is also intracranial air present within the left middle cranial fossa primarily. A large surgical resection cavity is again demonstrated within the right temporal lobe with overlying craniotomy. There is no definite acute traumatic injury of the right cerebral hemisphere. Vascular: Moderate vascular calcifications. Skull: Status post right temporoparietal craniotomy. There is no  definite acute fracture, but an occult fracture is presumably present given the intracranial air. Sinuses/Orbits: Near complete opacification of the sphenoid sinuses. The orbits are unremarkable. Other: None. IMPRESSION: 1. Interval development of extensive left-sided subarachnoid hemorrhage. There is also a small subdural hematoma present anteriorly  within the middle cranial fossa and there is likely a small hemorrhagic contusion of the left temporal lobe. 2.  Traumatic Brain Injury Risk Stratification Skull Fracture: Suspected occult nondisplaced fracture Subdural Hematoma (SDH): <14mm - mBIG 1 Subarachnoid Hemorrhage [facility]): localized, unilateral - Moderate/mBIG 2 Epidural Hematoma (EDH): No - Low/mBIG 1 Cerebral contusion, intra-axial, intraparenchymal Hemorrhage (IPH): solitary, <86mm - Low/mBIG 1 Intraventricular Hemorrhage (IVH): No - Low/mBIG 1 Midline Shift > 1mm or Edema/effacement of sulci/vents: No - Low/mBIG 1 ---------------------------------------------------- These results were called by telephone at the time of interpretation on 01/07/2024 at 10:06 am to provider Dr. CURTISTINE DAWN , who verbally acknowledged these results. Electronically Signed   By: Evalene Coho M.D.   On: 01/07/2024 10:16      Assessment/Plan GLF L SAH/SDH - NSGY evaluated here and felt comfortable with transfer to [facility] if accepted.  Discussion with [facility] oncology team that has taking care of the patient has been contacted by Dr. Paola.  Agreement in transferring patient to [facility] for further care and continuity of care at their facility H/O glioblastoma - currently managed at [facility] HTN Dispo - Tx to [facility]  I reviewed ED provider notes, last 24 h vitals and pain scores, last 48 h intake and output, last 24 h labs and trends, and last 24 h imaging results.  Burnard FORBES Banter, [facility] 01/08/2024, 10:42 AM Please see Amion for pager number during day hours 7:00am-4:30pm or 7:00am -11:30am on  weekends

## 2024-01-13 ENCOUNTER — Inpatient Hospital Stay: Admitting: Internal Medicine

## 2024-01-15 ENCOUNTER — Other Ambulatory Visit: Payer: Self-pay

## 2024-01-18 ENCOUNTER — Other Ambulatory Visit: Payer: Self-pay

## 2024-01-20 ENCOUNTER — Other Ambulatory Visit: Payer: Self-pay

## 2024-02-10 DEATH — deceased

## 2024-03-03 ENCOUNTER — Encounter: Payer: Self-pay | Admitting: Internal Medicine

## 2024-03-06 ENCOUNTER — Other Ambulatory Visit: Payer: Self-pay
